# Patient Record
Sex: Female | Born: 1935 | Race: White | Hispanic: No | Smoking: Never smoker
Health system: Southern US, Community
[De-identification: ages and names within clinical notes are randomized; demographics above are authoritative.]

## PROBLEM LIST (undated history)

## (undated) DIAGNOSIS — K648 Other hemorrhoids: Secondary | ICD-10-CM

## (undated) DIAGNOSIS — N183 Chronic kidney disease, stage 3 unspecified: Secondary | ICD-10-CM

## (undated) DIAGNOSIS — F32A Depression, unspecified: Secondary | ICD-10-CM

## (undated) DIAGNOSIS — R627 Adult failure to thrive: Secondary | ICD-10-CM

## (undated) DIAGNOSIS — B029 Zoster without complications: Secondary | ICD-10-CM

## (undated) DIAGNOSIS — M1990 Unspecified osteoarthritis, unspecified site: Secondary | ICD-10-CM

## (undated) DIAGNOSIS — K219 Gastro-esophageal reflux disease without esophagitis: Secondary | ICD-10-CM

## (undated) DIAGNOSIS — R232 Flushing: Secondary | ICD-10-CM

## (undated) DIAGNOSIS — F329 Major depressive disorder, single episode, unspecified: Secondary | ICD-10-CM

## (undated) DIAGNOSIS — K5790 Diverticulosis of intestine, part unspecified, without perforation or abscess without bleeding: Secondary | ICD-10-CM

## (undated) DIAGNOSIS — N390 Urinary tract infection, site not specified: Secondary | ICD-10-CM

## (undated) DIAGNOSIS — D649 Anemia, unspecified: Secondary | ICD-10-CM

## (undated) DIAGNOSIS — G43909 Migraine, unspecified, not intractable, without status migrainosus: Secondary | ICD-10-CM

## (undated) DIAGNOSIS — K5120 Ulcerative (chronic) proctitis without complications: Secondary | ICD-10-CM

## (undated) DIAGNOSIS — K449 Diaphragmatic hernia without obstruction or gangrene: Secondary | ICD-10-CM

## (undated) DIAGNOSIS — R630 Anorexia: Secondary | ICD-10-CM

## (undated) HISTORY — DX: Other hemorrhoids: K64.8

## (undated) HISTORY — DX: Urinary tract infection, site not specified: N39.0

## (undated) HISTORY — PX: KNEE SURGERY: SHX244

## (undated) HISTORY — PX: MASTOIDECTOMY: SHX711

## (undated) HISTORY — DX: Zoster without complications: B02.9

## (undated) HISTORY — DX: Anemia, unspecified: D64.9

## (undated) HISTORY — DX: Unspecified osteoarthritis, unspecified site: M19.90

## (undated) HISTORY — PX: ABDOMINAL HYSTERECTOMY: SHX81

## (undated) HISTORY — DX: Ulcerative (chronic) proctitis without complications: K51.20

## (undated) HISTORY — PX: NOSE SURGERY: SHX723

## (undated) HISTORY — DX: Diaphragmatic hernia without obstruction or gangrene: K44.9

## (undated) HISTORY — PX: TONSILLECTOMY: SUR1361

## (undated) HISTORY — DX: Depression, unspecified: F32.A

## (undated) HISTORY — DX: Major depressive disorder, single episode, unspecified: F32.9

## (undated) HISTORY — DX: Chronic kidney disease, stage 3 (moderate): N18.3

## (undated) HISTORY — DX: Flushing: R23.2

## (undated) HISTORY — DX: Gastro-esophageal reflux disease without esophagitis: K21.9

## (undated) HISTORY — DX: Migraine, unspecified, not intractable, without status migrainosus: G43.909

## (undated) HISTORY — DX: Chronic kidney disease, stage 3 unspecified: N18.30

## (undated) HISTORY — DX: Diverticulosis of intestine, part unspecified, without perforation or abscess without bleeding: K57.90

---

## 2003-05-30 DIAGNOSIS — K5120 Ulcerative (chronic) proctitis without complications: Secondary | ICD-10-CM

## 2003-05-30 HISTORY — DX: Ulcerative (chronic) proctitis without complications: K51.20

## 2006-11-19 ENCOUNTER — Inpatient Hospital Stay (HOSPITAL_COMMUNITY): Admission: RE | Admit: 2006-11-19 | Discharge: 2006-11-21 | Payer: Self-pay | Admitting: Obstetrics and Gynecology

## 2006-11-19 ENCOUNTER — Encounter (HOSPITAL_COMMUNITY): Payer: Self-pay | Admitting: Obstetrics and Gynecology

## 2007-06-25 ENCOUNTER — Ambulatory Visit: Payer: Self-pay | Admitting: Gastroenterology

## 2008-04-14 ENCOUNTER — Encounter: Payer: Self-pay | Admitting: Gastroenterology

## 2008-05-29 DIAGNOSIS — K5790 Diverticulosis of intestine, part unspecified, without perforation or abscess without bleeding: Secondary | ICD-10-CM

## 2008-05-29 DIAGNOSIS — K648 Other hemorrhoids: Secondary | ICD-10-CM

## 2008-05-29 DIAGNOSIS — K449 Diaphragmatic hernia without obstruction or gangrene: Secondary | ICD-10-CM

## 2008-05-29 HISTORY — DX: Other hemorrhoids: K64.8

## 2008-05-29 HISTORY — DX: Diaphragmatic hernia without obstruction or gangrene: K44.9

## 2008-05-29 HISTORY — DX: Diverticulosis of intestine, part unspecified, without perforation or abscess without bleeding: K57.90

## 2008-06-01 ENCOUNTER — Encounter: Admission: RE | Admit: 2008-06-01 | Discharge: 2008-06-01 | Payer: Self-pay | Admitting: Family Medicine

## 2008-08-12 ENCOUNTER — Encounter: Payer: Self-pay | Admitting: Gastroenterology

## 2008-10-06 ENCOUNTER — Ambulatory Visit: Payer: Self-pay | Admitting: Gastroenterology

## 2008-10-06 DIAGNOSIS — K219 Gastro-esophageal reflux disease without esophagitis: Secondary | ICD-10-CM

## 2008-10-06 DIAGNOSIS — K5190 Ulcerative colitis, unspecified, without complications: Secondary | ICD-10-CM | POA: Insufficient documentation

## 2008-10-07 LAB — CONVERTED CEMR LAB
Basophils Relative: 0.2 % (ref 0.0–3.0)
Eosinophils Absolute: 0.1 10*3/uL (ref 0.0–0.7)
Eosinophils Relative: 2.1 % (ref 0.0–5.0)
Ferritin: 104.1 ng/mL (ref 10.0–291.0)
HCT: 39 % (ref 36.0–46.0)
Iron: 43 ug/dL (ref 42–145)
Lymphocytes Relative: 24.9 % (ref 12.0–46.0)
Lymphs Abs: 1.5 10*3/uL (ref 0.7–4.0)
MCHC: 34 g/dL (ref 30.0–36.0)
MCV: 90.9 fL (ref 78.0–100.0)
Monocytes Absolute: 0.6 10*3/uL (ref 0.1–1.0)
Platelets: 245 10*3/uL (ref 150.0–400.0)
Transferrin: 211.6 mg/dL — ABNORMAL LOW (ref 212.0–360.0)
Vitamin B-12: 1152 pg/mL — ABNORMAL HIGH (ref 211–911)

## 2008-11-05 ENCOUNTER — Ambulatory Visit: Payer: Self-pay | Admitting: Gastroenterology

## 2008-11-13 ENCOUNTER — Telehealth: Payer: Self-pay | Admitting: Gastroenterology

## 2008-11-16 ENCOUNTER — Ambulatory Visit: Payer: Self-pay | Admitting: Gastroenterology

## 2008-11-16 ENCOUNTER — Encounter: Payer: Self-pay | Admitting: Gastroenterology

## 2008-11-17 ENCOUNTER — Encounter: Payer: Self-pay | Admitting: Gastroenterology

## 2008-11-19 ENCOUNTER — Telehealth: Payer: Self-pay | Admitting: Gastroenterology

## 2010-01-18 ENCOUNTER — Ambulatory Visit: Payer: Self-pay | Admitting: Gastroenterology

## 2010-06-19 ENCOUNTER — Encounter: Payer: Self-pay | Admitting: Family Medicine

## 2010-06-28 NOTE — Assessment & Plan Note (Signed)
Summary: something stuck in her throat...em    History of Present Illness Visit Type: Follow-up Visit Primary GI MD: Sheryn Bison MD FACP FAGA Primary Provider: Carolynn Sayers, NP  Requesting Provider: na Chief Complaint: Pt feels like something is stuck in her throat History of Present Illness:   Chronic globus sensation in a 77 year old Caucasian female with a known large hiatal hernia. She is doing better her Prilosec 20 mg day but continues to have some throat clearing and hoarseness. She denies true dysphagia. She underwent endoscopy and dilation the urine after gadolinium. She otherwise denies gastrointestinal complaints at this time.   GI Review of Systems      Denies abdominal pain, acid reflux, belching, bloating, chest pain, dysphagia with liquids, dysphagia with solids, heartburn, loss of appetite, nausea, vomiting, vomiting blood, weight loss, and  weight gain.        Denies anal fissure, black tarry stools, change in bowel habit, constipation, diarrhea, diverticulosis, fecal incontinence, heme positive stool, hemorrhoids, irritable bowel syndrome, jaundice, light color stool, liver problems, rectal bleeding, and  rectal pain.    Current Medications (verified): 1)  Ipratropium Bromide 0.03 % Soln (Ipratropium Bromide) .... Two Spray Each Nostril As Needed 2)  Prilosec Otc 20 Mg Tbec (Omeprazole Magnesium) .... One Tablet By Mouth Once Daily 3)  Isometheptene-Apap-Dichloral 65-325-100 Mg Caps (Apap-Isometheptene-Dichloral) .... Two As Needed For Migrains 4)  Diazepam 2 Mg Tabs (Diazepam) .... As Needed For Sleep and Stress 5)  Motrin Ib 200 Mg Tabs (Ibuprofen) .... One Tablet By Mouth Once Daily 6)  Fish Oil 1200 Mg Caps (Omega-3 Fatty Acids) .Marland Kitchen.. 1 Cap By Mouth Once Daily 7)  Calcium Carbonate-Vitamin D 600-400 Mg-Unit  Tabs (Calcium Carbonate-Vitamin D) .... One Tablet By Mouth Once Daily 8)  Vitamin D3 2000 Unit Caps (Cholecalciferol) .... One Cap By Mouth Once  Daily 9)  Multivitamins   Tabs (Multiple Vitamin) .... One Tab By Mouth Once Daily 10)  Aspirin 81 Mg  Tabs (Aspirin) .... One Tablet By Mouth Once Daily 11)  Cvs Stool Softener 240 Mg Caps (Docusate Calcium) .... One Tablet By Mouth Once Daily 12)  Prozac 10 Mg Caps (Fluoxetine Hcl) .... One Tablet By Mouth Once Daily 13)  Ketoconazole 2 % Sham (Ketoconazole) .... As Directed 14)  Benadryl 25 Mg Caps (Diphenhydramine Hcl) .... One Capsule By Mouth Once Daily At Bedtime 15)  Ensure  Liqd (Nutritional Supplements) .... Once or Twice A Week  Allergies (verified): No Known Drug Allergies  Past History:  Past medical, surgical, family and social histories (including risk factors) reviewed for relevance to current acute and chronic problems.  Past Medical History: Current Problems:  DEPRESSION (ICD-311) FATIGUE, ACUTE (ICD-780.79) CONSTIPATION (ICD-564.00) COLITIS, ULCERATIVE (ICD-556.9) GERD (ICD-530.81) ARTHRITIS (ICD-716.90) UNSPECIFIED ANEMIA (ICD-285.9)  Past Surgical History: Reviewed history from 10/06/2008 and no changes required. Hysterectomy Knee Surgery Mastoid  Family History: Reviewed history from 10/06/2008 and no changes required. Family History of Breast Cancer:Mother  No FH of Colon Cancer: Family History of Diabetes: Mother   Social History: Reviewed history from 10/06/2008 and no changes required. Widowed Patient has never smoked.  Alcohol Use - no Illicit Drug Use - no Daily Caffeine Use: 1 cup a day   Review of Systems       The patient complains of anemia, arthritis/joint pain, cough, depression-new, fatigue, and voice change.  The patient denies allergy/sinus, anxiety-new, back pain, blood in urine, breast changes/lumps, change in vision, confusion, coughing up blood, fainting, fever, headaches-new, hearing problems, heart  murmur, heart rhythm changes, itching, menstrual pain, muscle pains/cramps, night sweats, nosebleeds, pregnancy symptoms,  shortness of breath, skin rash, sleeping problems, sore throat, swelling of feet/legs, swollen lymph glands, thirst - excessive , urination - excessive , urination changes/pain, urine leakage, and vision changes.    Vital Signs:  Patient profile:   77 year old female Height:      66 inches Weight:      124 pounds BMI:     20.09 BSA:     1.63 Pulse rate:   78 / minute Pulse rhythm:   regular BP sitting:   124 / 62  (left arm) Cuff size:   regular  Vitals Entered By: Ok Anis CMA (January 18, 2010 2:25 PM)   Physical Exam  General:  Well developed, well nourished, no acute distress.healthy appearing.   Head:  Normocephalic and atraumatic. Eyes:  PERRLA, no icterus. Mouth:  No deformity or lesions, dentition normal. Lungs:  Clear throughout to auscultation. Heart:  Regular rate and rhythm; no murmurs, rubs,  or bruits. Abdomen:  Soft, nontender and nondistended. No masses, hepatosplenomegaly or hernias noted. Normal bowel sounds. Extremities:  No clubbing, cyanosis, edema or deformities noted. Neurologic:  Alert and  oriented x4;  grossly normal neurologically. Cervical Nodes:  No significant cervical adenopathy. Psych:  Alert and cooperative. Normal mood and affect.   Impression & Recommendations:  Problem # 1:  GERD (ICD-530.81) Assessment Improved Try Dexilant 60 mg a day in place of Prilosec. She is to call in one month's time for progress report. She obviously needs chronic PPI therapy and a chronic anti-reflex regime. I see no need for repeat endoscopy at this time.  Problem # 2:  CONSTIPATION (ICD-564.00) Assessment: Improved high-fiber diet as tolerated.  Patient Instructions: 1)  Stop Omeprazole. 2)  Begin Dexilant once a day.   3)  Report back in one month. 4)  The medication list was reviewed and reconciled.  All changed / newly prescribed medications were explained.  A complete medication list was provided to the patient / caregiver. 5)  Copy sent to :Dr.  Sherre Lain  6)  Please continue current medications.  7)  Avoid foods high in acid content ( tomatoes, citrus juices, spicy foods) . Avoid eating within 3 to 4 hours of lying down or before exercising. Do not over eat; try smaller more frequent meals. Elevate head of bed four inches when sleeping.

## 2010-10-11 NOTE — Op Note (Signed)
Stacy Mcguire, Stacy Mcguire NO.:  [id number]   MEDICAL RECORD NO.:  [id number]          PATIENT TYPE:  INP   LOCATION:  9302                          [facility]ACILITY:  [facility]   PHYSICIAN:  Zelphia Cairo, MD    DATE OF BIRTH:  Apr 02, 1928   DATE OF PROCEDURE:  11/19/2006  DATE OF DISCHARGE:                               OPERATIVE REPORT   PREOPERATIVE DIAGNOSIS:  1. Fibroid uterus.  2. Pelvic mass.   POSTOPERATIVE DIAGNOSIS:  1. Fibroid uterus.  2. Pelvic mass.  3. Pathology pending.   PROCEDURE:  TAH-BSO   SURGEON:  Dr. Renaldo Fiddler   ASSISTANT:  Dr. Rana Snare   ANESTHESIA:  General.   SPECIMEN:  Uterine cervix.  Bilateral tubes and ovaries.   ESTIMATED BLOOD LOSS:  700 mL.   COMPLICATIONS:  None.   URINE OUTPUT:  125 mL of clear urine.   FLUIDS:  2 liters.   CONDITION:  Stable and extubated to recovery room.   PROCEDURE:  The patient was taken to the operating room where general  anesthesia was easily obtained. She was placed in the supine position.  She was prepped and draped in sterile fashion and a Foley catheter was  inserted.  A Pfannenstiel skin incision was made with a scalpel and this  was carried down to the underlying fascia.  The fascia was incised in  the midline and this was extended laterally using Mayo scissors.  Kocher  clamps were used to grasp the superior portion of the fascia.  This was  tented upwards and the underlying rectus muscles were dissected off  using the Bovie.  The inferior portion of the fascia was then tented  upwards and dissected off the underlying rectus muscles using the Bovie.  The peritoneum was then identified and entered bluntly.  The self-  retaining O'Connor-O'Sullivan retractor was then inserted and the bowel  was packed out of the pelvis. Upon inspection the enlarged fibroid  uterus was noted and brought to the incision. Bilateral fallopian tubes  and ovaries were normal in appearance.   The large exophytic anterior  fibroid was grasped with a thyroid  tenaculum for manipulation of the uterus, bilateral cornua were also  grasped with Kelly clamps for uterine manipulation.  Bilateral round  ligaments were cauterized and transected.  The right round ligament was  suture ligated with #0 Vicryl.  The anterior leaf of the broad ligament  was then incised along the bladder reflection to the midline from both  sides.  The bladder was gently dissected off the lower uterine segment  using a dry sponge.  Infundibulopelvic ligaments on both sides were then  skeletonized, clamped and transected.  They were doubly suture ligated  with #0 Vicryl.  Excellent hemostasis was noted. Uterine artery on the  right side was skeletonized, clamped and transected.  This pedicle was  then suture ligated with #0 Vicryl. The right uterine artery was then  clamped, transected and suture ligated.  At this point there were noted  to be some bleeding from the left peritoneal edges adjacent to the left  uterine artery pedicle.  This was clamped with a right angle clamp and  suture ligated.  Next the uterosacral ligaments were clamped  bilaterally, transected and suture ligated.  The cervix and uterus were  then amputated using Mayo scissors.  The vaginal cuff angles were closed  using figure-of-eight stitches of #0 Vicryl.  The remainder of the  vaginal cuff was closed using a series of interrupted #0 Vicryl figure-  of-eight stitches.  Hemostasis of the vaginal cuff was assured at this  time.  The pelvis was then copiously irrigated with warm normal saline.  Ureters were identified bilaterally.  Hemostasis was assured.  All bowel  packs were removed.  The appendix was visualized and appeared normal.  The retractor was removed. The peritoneum was then reapproximated with  Vicryl.  The fascia was closed using Monocryl and the skin was closed  with staples.  The patient tolerated the procedure well.  Sponge, lap,  needle and instrument  counts were correct x2.  She was given antibiotics  prior to skin incision.  The patient was then taken to the PACU in  stable condition.  IVP was ordered in the PACU to assess the left  ureter.      Zelphia Cairo, MD  Electronically Signed     GA/MEDQ  D:  11/19/2006  T:  11/19/2006  Job:  [id number]

## 2010-10-11 NOTE — Assessment & Plan Note (Signed)
[facility]                         GASTROENTEROLOGY OFFICE NOTE   SELESTE, TALLMAN                       MRN:          [id number]  DATE:06/25/2007                            DOB:          [birth year]    HISTORY:  Ms. Brickel is a 77 year old white female who has had chronic  inflammatory bowel disease, manifested mostly by proctitis, with several  colonoscopies in [place], [place] many years ago.  I last did a  surveillance colonoscopy five years ago, and she had some mild  proctitis.  Her exam otherwise was normal, with multiple mucosal  biopsies.   The patient  has been maintained on Dipentum 250 mg three times daily  for several years, but currently would like to switch medications,  because of cost constraints.  She takes ibuprofen, vitamin C,  multivitamins and over-the-counter Prilosec daily.  She is currently in  remission and denies abdominal pain, bowel irregularity, melena or  hematochezia.   FAMILY HISTORY:  She has no family history of colon cancer or colonic  polyposis, but does have a sister with questionable Crohn's disease.   PAST MEDICAL/SURGICAL HISTORY:  The patient recently has had a  hysterectomy because of fibroid tumors of her uterus.  She is followed  by Dr. Samara Snide at [facility] and has  routine blood work and urinalysis done.   PHYSICAL EXAMINATION:  GENERAL:  She is a healthy-appearing white  female, in no distress appearing her stated age.  VITAL SIGNS:  She is 5 feet 6-1/2 inches tall, weight 129 pounds, blood  pressure 136/70, pulse 90.  The patient did not desire a physical examination.   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  I have recommended to her that she have  a dysplasia screening every three to five years, but she has declined.   RECOMMENDATIONS:  1. At her request, I will switch her to Azulfidine 500 mg twice  daily.      I have explained to her the side effects from sulfa medications,      including nausea, photo-sensitivity, headache and skin rashes.  2. The patient needs folic acid 1 mg daily supplementation on      Azulfidine.  3. The patient needs routine yearly CBC, liver profiles and      urinalysis, on chronic immuno-salicylate therapy.  She relates that      she receives this through her primary care physician.      We will ask for a copy of these results.  4. Continue other medications as per her primary care physicians.     Vania Rea. Jarold Motto, MD, Caleen Essex, FAGA  Electronically Signed    DRP/MedQ  DD: 06/25/2007  DT: 06/25/2007  Job #: [id number]   cc:   Samara Snide, MD  Zelphia Cairo, MD

## 2010-10-11 NOTE — H&P (Signed)
Stacy Mcguire, SERMONS NO.:  [id number]   MEDICAL RECORD NO.:  [id number]          PATIENT TYPE:  AMB   LOCATION:  SDC                           [facility]ILITY:  [facility]   PHYSICIAN:  Zelphia Cairo, MD    DATE OF BIRTH:  December 24, 1927   DATE OF ADMISSION:  11/19/2006  DATE OF DISCHARGE:                              HISTORY & PHYSICAL   Shandrea is a 77 year old white female who presented to the office  originally with complaints of a bulge in her pelvic area. She denies any  pain, bowel or bladder complaints. She denies any other concerns.  On  further evaluation, ultrasound revealed a fibroid uterus with fibroids  measuring 1.8 to 8.8 cm and a complex solid mass with calcifications in  the mid pelvis.   PAST MEDICAL HISTORY:  1. Migraines.  2. GERD.  3. Ulcerative colitis.   PAST SURGICAL HISTORY:  1. Left knee surgery.  2. Mastoid surgery.   SOCIAL HISTORY:  Negative for tobacco and drug [place].  She uses alcohol  occasionally.   ALLERGIES:  None.   MEDICATIONS:  1. Dipentum 3 times per day.  2. Prilosec one time per day.  3. Midrin as needed.  4. Diazepam p.r.n.  5. Ibuprofen p.r.n.  6. Multivitamins daily.   GYN HISTORY:  Significant for menopause at the age of 29.  She denies a  history of abnormal PAP smears.  Her last Pap smear in June 2008 was  normal.   FAMILY HISTORY:  Significant for mom diagnosed with breast cancer in her  60s.  Negative for colon and GYN cancer.   OB HISTORY:  Significant for four full-term vaginal deliveries.   PHYSICAL EXAMINATION:  VITAL SIGNS:  Height 5 feet 6 inches, weight 128.  Blood pressure 110/80.  HEAD AND NECK:  Normal.  No thyromegaly or nodularity.  HEART:  Regular rate and rhythm.  LUNGS:  Clear bilaterally.  ABDOMEN:  Soft, nontender.  No palpable masses are noted.  Good bowel  sounds in all four quadrants.  PELVIC:  Exam shows normal external female genitalia. Vagina is atrophic  without lesions.  Cervix  appears normal.  Uterus is enlarged, mobile,  and nontender, approximately 18-week size.  No adnexal tenderness is  noted.  RECTAL:  Exam confirms.  There is no nodularity.   Pelvic ultrasound:  Fibroid uterus with 4 fibroids measuring 1.8 cm, 5.2  cm, 7.0 cm, and 8.8 cm.  There is also a large complex mass with solid  components and calcifications in the mid pelvis measuring 9.6 x 5.8 x  7.8 cm.  A small amount of fluid in the cul-de-sac.   CA125 was done and normal.   ASSESSMENT AND PLAN:  A 77 year old white female with fibroid uterus and  pelvic mass.  CA125 is normal. We discussed surgical management. Risk,  benefits, and alternatives were discussed with the patient for total  abdominal hysterectomy and bilateral salpingo-oophorectomy.      Zelphia Cairo, MD  Electronically Signed     GA/MEDQ  D:  11/18/2006  T:  11/18/2006  Job:  [id number]

## 2011-03-15 LAB — CBC
HCT: 31.2 — ABNORMAL LOW
HCT: 39.3
Hemoglobin: 13
MCHC: 33
MCV: 87.8
MCV: 88.3
Platelets: 207
Platelets: 225
RBC: 3.55 — ABNORMAL LOW
RDW: 13.8
WBC: 10.1

## 2011-03-15 LAB — CREATININE, SERUM
Creatinine, Ser: 0.78
GFR calc non Af Amer: >60

## 2011-03-15 LAB — BUN: BUN: 17

## 2011-11-21 ENCOUNTER — Other Ambulatory Visit: Payer: Self-pay | Admitting: Family Medicine

## 2011-11-21 DIAGNOSIS — Z1231 Encounter for screening mammogram for malignant neoplasm of breast: Secondary | ICD-10-CM

## 2011-12-18 ENCOUNTER — Ambulatory Visit
Admission: RE | Admit: 2011-12-18 | Discharge: 2011-12-18 | Disposition: A | Discharge status: Home / Self Care | Payer: Medicare PPO | Source: Ambulatory Visit | Attending: Family Medicine | Admitting: Family Medicine

## 2011-12-18 DIAGNOSIS — Z1231 Encounter for screening mammogram for malignant neoplasm of breast: Secondary | ICD-10-CM

## 2012-01-18 ENCOUNTER — Ambulatory Visit (INDEPENDENT_AMBULATORY_CARE_PROVIDER_SITE_OTHER): Payer: Medicare PPO | Admitting: Gastroenterology

## 2012-01-18 ENCOUNTER — Encounter: Payer: Self-pay | Admitting: Gastroenterology

## 2012-01-18 VITALS — BP 128/76 | HR 68 | Ht 64.0 in | Wt 132.2 lb

## 2012-01-18 DIAGNOSIS — M818 Other osteoporosis without current pathological fracture: Secondary | ICD-10-CM

## 2012-01-18 DIAGNOSIS — Z8719 Personal history of other diseases of the digestive system: Secondary | ICD-10-CM

## 2012-01-18 NOTE — Progress Notes (Signed)
History of Present Illness: This is a 78 year old Caucasian female with chronic idiopathic osteoporosis treated with calcium and vitamin D. She has chronic GERD well-controlled with Prilosec 20 mg a day. She continues with some extra esophageal manifestations of acid reflux such as hoarseness, coughing, and throat clearing. She is refused ENT exam. Endoscopic exam 2 years ago showed a large hiatal hernia but no evidence of Barrett's mucosa. She is up-to-date on her colonoscopy exam. The patient denies other gastrointestinal complaints.    Current Medications, Allergies, Past Medical History, Past Surgical History, Family History and Social History were reviewed in [employer] record.   Assessment and plan:we had a long discussion today about GERD, PPI therapy, osteoporosis, esophageal cancer, throat cancer, et Karie Soda. I have urged her to continue Prilosec 20 mg a day to control her GERD, and I do not think she needs followup endoscopy at this time. I have suggested ENT evaluation which she will consider.I doubt her osteoporosis has adversely affected by Prilosec 20 mg a day. This patient is very detail oriented,, and has multiple questions, does frequent medical searches on the Internet, and has multiple somatic complaints that she tries to dismiss secondary to medications. After reviewing all of her data, I do not think these symptoms are more common than one would see on placebo vs. PPI use. No diagnosis found.

## 2012-01-18 NOTE — Patient Instructions (Addendum)
Continue your current medications.

## 2013-02-20 ENCOUNTER — Other Ambulatory Visit: Payer: Self-pay

## 2013-02-20 DIAGNOSIS — Z1231 Encounter for screening mammogram for malignant neoplasm of breast: Secondary | ICD-10-CM

## 2013-03-19 ENCOUNTER — Ambulatory Visit
Admission: RE | Admit: 2013-03-19 | Discharge: 2013-03-19 | Disposition: A | Discharge status: Home / Self Care | Payer: Medicare PPO | Source: Ambulatory Visit

## 2013-03-19 DIAGNOSIS — Z1231 Encounter for screening mammogram for malignant neoplasm of breast: Secondary | ICD-10-CM

## 2014-09-15 ENCOUNTER — Other Ambulatory Visit: Payer: Self-pay | Admitting: Family Medicine

## 2014-09-15 DIAGNOSIS — R1310 Dysphagia, unspecified: Secondary | ICD-10-CM

## 2014-09-18 ENCOUNTER — Ambulatory Visit
Admission: RE | Admit: 2014-09-18 | Discharge: 2014-09-18 | Disposition: A | Discharge status: Home / Self Care | Payer: Medicare PPO | Source: Ambulatory Visit | Attending: Family Medicine | Admitting: Family Medicine

## 2014-09-18 DIAGNOSIS — R1310 Dysphagia, unspecified: Secondary | ICD-10-CM

## 2014-10-28 ENCOUNTER — Emergency Department (HOSPITAL_COMMUNITY)
Admission: EM | Admit: 2014-10-28 | Discharge: 2014-10-28 | Disposition: A | Discharge status: Home / Self Care | Payer: Medicare PPO | Attending: Emergency Medicine | Admitting: Emergency Medicine

## 2014-10-28 ENCOUNTER — Encounter (HOSPITAL_COMMUNITY): Payer: Self-pay | Admitting: Emergency Medicine

## 2014-10-28 DIAGNOSIS — R531 Weakness: Secondary | ICD-10-CM

## 2014-10-28 DIAGNOSIS — Z7952 Long term (current) use of systemic steroids: Secondary | ICD-10-CM | POA: Diagnosis not present

## 2014-10-28 DIAGNOSIS — F329 Major depressive disorder, single episode, unspecified: Secondary | ICD-10-CM | POA: Diagnosis not present

## 2014-10-28 DIAGNOSIS — K219 Gastro-esophageal reflux disease without esophagitis: Secondary | ICD-10-CM | POA: Insufficient documentation

## 2014-10-28 DIAGNOSIS — Z79899 Other long term (current) drug therapy: Secondary | ICD-10-CM | POA: Insufficient documentation

## 2014-10-28 DIAGNOSIS — N390 Urinary tract infection, site not specified: Secondary | ICD-10-CM

## 2014-10-28 DIAGNOSIS — Z862 Personal history of diseases of the blood and blood-forming organs and certain disorders involving the immune mechanism: Secondary | ICD-10-CM | POA: Diagnosis not present

## 2014-10-28 LAB — COMPREHENSIVE METABOLIC PANEL
ALT: 17 U/L (ref 14–54)
AST: 16 U/L (ref 15–41)
Albumin: 3.3 g/dL — ABNORMAL LOW (ref 3.5–5.0)
Alkaline Phosphatase: 66 U/L (ref 38–126)
Anion gap: 8 (ref 5–15)
BUN: 26 mg/dL — AB (ref 6–20)
CALCIUM: 9 mg/dL (ref 8.9–10.3)
CHLORIDE: 106 mmol/L (ref 101–111)
CO2: 22 mmol/L (ref 22–32)
Creatinine, Ser: 1 mg/dL (ref 0.44–1.00)
GFR calc Af Amer: 58 mL/min — ABNORMAL LOW (ref >60)
GFR, EST NON AFRICAN AMERICAN: 50 mL/min — AB (ref >60)
Glucose, Bld: 133 mg/dL — ABNORMAL HIGH (ref 65–99)
Potassium: 3.4 mmol/L — ABNORMAL LOW (ref 3.5–5.1)
Sodium: 136 mmol/L (ref 135–145)
Total Bilirubin: 0.3 mg/dL (ref 0.3–1.2)
Total Protein: 6.9 g/dL (ref 6.5–8.1)

## 2014-10-28 LAB — CBC WITH DIFFERENTIAL/PLATELET
BASOS PCT: 0 % (ref 0–1)
Basophils Absolute: 0 10*3/uL (ref 0.0–0.1)
Eosinophils Absolute: 0 10*3/uL (ref 0.0–0.7)
Eosinophils Relative: 0 % (ref 0–5)
HEMATOCRIT: 35.2 % — AB (ref 36.0–46.0)
Hemoglobin: 11.4 g/dL — ABNORMAL LOW (ref 12.0–15.0)
Lymphocytes Relative: 15 % (ref 12–46)
Lymphs Abs: 1.4 10*3/uL (ref 0.7–4.0)
MCH: 28.5 pg (ref 26.0–34.0)
MCHC: 32.4 g/dL (ref 30.0–36.0)
MCV: 88 fL (ref 78.0–100.0)
Monocytes Absolute: 1.2 10*3/uL — ABNORMAL HIGH (ref 0.1–1.0)
Monocytes Relative: 13 % — ABNORMAL HIGH (ref 3–12)
NEUTROS ABS: 6.8 10*3/uL (ref 1.7–7.7)
Neutrophils Relative %: 72 % (ref 43–77)
Platelets: 264 10*3/uL (ref 150–400)
RBC: 4 MIL/uL (ref 3.87–5.11)
RDW: 13.4 % (ref 11.5–15.5)
WBC: 9.4 10*3/uL (ref 4.0–10.5)

## 2014-10-28 LAB — TSH: TSH: 1.58 u[IU]/mL (ref 0.350–4.500)

## 2014-10-28 LAB — URINALYSIS, ROUTINE W REFLEX MICROSCOPIC
Bilirubin Urine: NEGATIVE
Glucose, UA: NEGATIVE mg/dL
Ketones, ur: NEGATIVE mg/dL
Nitrite: POSITIVE — AB
Protein, ur: 30 mg/dL — AB
Specific Gravity, Urine: 1.019 (ref 1.005–1.030)
UROBILINOGEN UA: 0.2 mg/dL (ref 0.0–1.0)
pH: 6.5 (ref 5.0–8.0)

## 2014-10-28 LAB — URINE MICROSCOPIC-ADD ON

## 2014-10-28 LAB — I-STAT TROPONIN, ED: TROPONIN I, POC: 0 ng/mL (ref 0.00–0.08)

## 2014-10-28 MED ORDER — CEFTRIAXONE SODIUM 1 G IJ SOLR
1.0000 g | Freq: Once | INTRAMUSCULAR | Status: AC
  Administered 2014-10-28: 1 g via INTRAVENOUS
  Filled 2014-10-28: qty 10

## 2014-10-28 MED ORDER — CEPHALEXIN 500 MG PO CAPS: 500.0000 mg | ORAL_CAPSULE | Freq: Three times a day (TID) | ORAL | Status: DC

## 2014-10-28 MED ORDER — SODIUM CHLORIDE 0.9 % IV BOLUS (SEPSIS)
1000.0000 mL | Freq: Once | INTRAVENOUS | Status: AC
  Administered 2014-10-28: 1000 mL via INTRAVENOUS

## 2014-10-28 NOTE — ED Notes (Signed)
Patient states she has been feeling weak x 6 years, but today was worse. Patient states she has been sleeping more than usual, decreased appetite, and "feels bad." patient denies any pain or discomforts.

## 2014-10-28 NOTE — ED Notes (Signed)
Patient states she woke up this morning feeling hot, and was having similar experiences over the weekend with increased sleeping x3 days. Patient unsure if she had a fever. Decreased PO intake. Patient denies sick contact. Patient states she feels "weak" and has felt this way for 6 years.

## 2014-10-28 NOTE — ED Provider Notes (Signed)
CSN: [id number]     Arrival date & time 10/28/14  1023 History   First MD Initiated Contact with Patient 10/28/14 1057     Chief Complaint  Patient presents with  . feels bad      (Consider location/radiation/quality/duration/timing/severity/associated sxs/prior Treatment) HPI Stacy Mcguire is a 81 y.o. female with history of anemia, depression, GERD, presents to emergency department with complaint of weakness. Patient states she has been weak for multiple years. She has been to her primary doctor several times but no diagnosis. She states this morning when she woke up she felt worse than usual. She states she had generalized weakness and just "did not feel good." She denies any pain.  No headache. No recent losses. No urinary symptoms. No nausea, vomiting, diarrhea. Patient states she told her family who decided to bring her here. Patient states that she feels better already. Currently asymptomatic.=  Past Medical History  Diagnosis Date  . Arthritis   . Anemia, unspecified   . Depression   . GERD (gastroesophageal reflux disease)   . Diverticulosis 2010    Colonoscopy  . Internal hemorrhoids 2010    Colonoscopy  . Hiatal hernia 2010    EGD   . Ulcerative (chronic) proctitis 2005    Colonoscopy    Past Surgical History  Procedure Laterality Date  . Abdominal hysterectomy    . Knee surgery    . Mastoidectomy     Family History  Problem Relation Age of Onset  . Breast cancer Mother   . Colon cancer Neg Hx    History  Substance Use Topics  . Smoking status: Never Smoker   . Smokeless tobacco: Never Used  . Alcohol Use: No   OB History    No data available     Review of Systems  Constitutional: Positive for fatigue. Negative for fever, chills, activity change and appetite change.  Respiratory: Negative for cough, chest tightness and shortness of breath.   Cardiovascular: Negative for chest pain, palpitations and leg swelling.  Gastrointestinal: Negative for nausea,  vomiting, abdominal pain and diarrhea.  Genitourinary: Negative for dysuria, flank pain, vaginal bleeding, vaginal discharge, vaginal pain and pelvic pain.  Musculoskeletal: Negative for myalgias, arthralgias, neck pain and neck stiffness.  Skin: Negative for rash.  Neurological: Positive for weakness. Negative for dizziness and headaches.  All other systems reviewed and are negative.     Allergies  Neosporin af  Home Medications   Prior to Admission medications   Medication Sig Start Date End Date Taking? Authorizing Provider  acetaminophen (TYLENOL) 500 MG tablet Take 500 mg by mouth every 6 (six) hours as needed for moderate pain or headache.   Yes Historical Provider, MD  diazepam (VALIUM) 2 MG tablet Take 2 mg by mouth at bedtime as needed for sedation.    Yes Historical Provider, MD  ENSURE (ENSURE) Take 1 Can by mouth daily. Once or twice a week   Yes Historical Provider, MD  hydrocortisone cream 0.5 % Apply 1 application topically 2 (two) times daily as needed for itching.   Yes Historical Provider, MD  omeprazole (PRILOSEC) 20 MG capsule Take 20 mg by mouth daily.   Yes Historical Provider, MD   BP 118/59 mmHg  Pulse 93  Temp(Src) 97.7 F (36.5 C) (Oral)  Resp 16  SpO2 97% Physical Exam  Constitutional: She is oriented to person, place, and time. She appears well-developed and well-nourished. No distress.  HENT:  Head: Normocephalic.  Eyes: Conjunctivae and EOM are normal.  Pupils are equal, round, and reactive to light.  Neck: Normal range of motion. Neck supple.  Cardiovascular: Normal rate, regular rhythm, normal heart sounds and intact distal pulses.   Pulmonary/Chest: Effort normal and breath sounds normal. No respiratory distress. She has no wheezes. She has no rales.  Abdominal: Soft. Bowel sounds are normal. She exhibits no distension. There is no tenderness. There is no rebound.  Musculoskeletal: She exhibits no edema.  Neurological: She is alert and oriented  to person, place, and time. No cranial nerve deficit. Coordination normal.  5/5 and equal upper and lower extremity strength bilaterally. Equal grip strength bilaterally. Normal finger to nose and heel to shin. No pronator drift.   Skin: Skin is warm and dry.  Psychiatric: She has a normal mood and affect. Her behavior is normal.  Nursing note and vitals reviewed.   ED Course  Procedures (including critical care time) Labs Review Labs Reviewed  CBC WITH DIFFERENTIAL/PLATELET - Abnormal; Notable for the following:    Hemoglobin 11.4 (*)    HCT 35.2 (*)    Monocytes Relative 13 (*)    Monocytes Absolute 1.2 (*)    All other components within normal limits  COMPREHENSIVE METABOLIC PANEL - Abnormal; Notable for the following:    Potassium 3.4 (*)    Glucose, Bld 133 (*)    BUN 26 (*)    Albumin 3.3 (*)    GFR calc non Af Amer 50 (*)    GFR calc Af Amer 58 (*)    All other components within normal limits  URINALYSIS, ROUTINE W REFLEX MICROSCOPIC (NOT AT [facility]) - Abnormal; Notable for the following:    APPearance CLOUDY (*)    Hgb urine dipstick MODERATE (*)    Protein, ur 30 (*)    Nitrite POSITIVE (*)    Leukocytes, UA LARGE (*)    All other components within normal limits  URINE MICROSCOPIC-ADD ON - Abnormal; Notable for the following:    Bacteria, UA MANY (*)    Casts HYALINE CASTS (*)    All other components within normal limits  URINE CULTURE  TSH  I-STAT TROPOININ, ED    Imaging Review No results found.   EKG Interpretation None      MDM   Final diagnoses:  UTI (lower urinary tract infection)  Weakness    Patient with generalized weakness for multiple years, worse this morning. No specific complaints. Her exam is unremarkable. We'll get labs, urinalysis, will monitor.  2:29 PM  UA infected, culture sent. Will give 1 g of Keflex IV, bolus of fluids ordered as well. Pt is not vomiting. No pain. VS normal. Afebrile.  Discussed results with patient, plan to  discharge home with antibiotics, follow-up with pcp.   Filed Vitals:   10/28/14 1039 10/28/14 1231 10/28/14 1235 10/28/14 1515  BP: 118/59  142/70 162/77  Pulse: 93 79 78 77  Temp: 97.7 F (36.5 C)     TempSrc: Oral     Resp: 16 13 16 16   SpO2: 97% 97% 96% 96%       Jeannett Senior, PA-C 10/28/14 1637  Virgel Manifold, MD 10/29/14 [phone]

## 2014-10-28 NOTE — Discharge Instructions (Signed)
Your urine analysis today shows infection. Take keflex as prescribed until all gone. Follow up with your doctor or doctor of your choice for recheck if not improving. Return if worsening symptoms, fever, vomiting, pain.   Urinary Tract Infection Urinary tract infections (UTIs) can develop anywhere along your urinary tract. Your urinary tract is your body's drainage system for removing wastes and extra water. Your urinary tract includes two kidneys, two ureters, a bladder, and a urethra. Your kidneys are a pair of bean-shaped organs. Each kidney is about the size of your fist. They are located below your ribs, one on each side of your spine. CAUSES Infections are caused by microbes, which are microscopic organisms, including fungi, viruses, and bacteria. These organisms are so small that they can only be seen through a microscope. Bacteria are the microbes that most commonly cause UTIs. SYMPTOMS  Symptoms of UTIs may vary by age and gender of the patient and by the location of the infection. Symptoms in young women typically include a frequent and intense urge to urinate and a painful, burning feeling in the bladder or urethra during urination. Older women and men are more likely to be tired, shaky, and weak and have muscle aches and abdominal pain. A fever may mean the infection is in your kidneys. Other symptoms of a kidney infection include pain in your back or sides below the ribs, nausea, and vomiting. DIAGNOSIS To diagnose a UTI, your caregiver will ask you about your symptoms. Your caregiver also will ask to provide a urine sample. The urine sample will be tested for bacteria and white blood cells. White blood cells are made by your body to help fight infection. TREATMENT  Typically, UTIs can be treated with medication. Because most UTIs are caused by a bacterial infection, they usually can be treated with the use of antibiotics. The choice of antibiotic and length of treatment depend on your  symptoms and the type of bacteria causing your infection. HOME CARE INSTRUCTIONS  If you were prescribed antibiotics, take them exactly as your caregiver instructs you. Finish the medication even if you feel better after you have only taken some of the medication.  Drink enough water and fluids to keep your urine clear or pale yellow.  Avoid caffeine, tea, and carbonated beverages. They tend to irritate your bladder.  Empty your bladder often. Avoid holding urine for long periods of time.  Empty your bladder before and after sexual intercourse.  After a bowel movement, women should cleanse from front to back. Use each tissue only once. SEEK MEDICAL CARE IF:   You have back pain.  You develop a fever.  Your symptoms do not begin to resolve within 3 days. SEEK IMMEDIATE MEDICAL CARE IF:   You have severe back pain or lower abdominal pain.  You develop chills.  You have nausea or vomiting.  You have continued burning or discomfort with urination. MAKE SURE YOU:   Understand these instructions.  Will watch your condition.  Will get help right away if you are not doing well or get worse. Document Released: 02/22/2005 Document Revised: 11/14/2011 Document Reviewed: 06/23/2011 [facility] Patient Information 2015 [place], [place]. This information is not intended to replace advice given to you by your health care provider. Make sure you discuss any questions you have with your health care provider.

## 2014-10-28 NOTE — ED Notes (Signed)
ED discharge tool not working; pt understood discharge instruction and not in any pain upon discharge

## 2014-10-30 LAB — URINE CULTURE: Colony Count: >=100000

## 2014-10-31 NOTE — Progress Notes (Signed)
ED Antimicrobial Stewardship Positive Culture Follow Up   Stacy Mcguire is an 81 y.o. female who presented to [facility] on 10/28/2014 with a chief complaint of  Chief Complaint  Patient presents with  . feels bad     Recent Results (from the past 720 hour(s))  Urine culture     Status: None   Collection Time: 10/28/14  4:34 PM  Result Value Ref Range Status   Specimen Description URINE, RANDOM  Final   Special Requests NONE  Final   Colony Count   Final    >=100,000 COLONIES/ML Performed at [facility]    Culture   Final    ESCHERICHIA COLI Performed at [facility]    Report Status 10/30/2014 FINAL  Final   Organism ID, Bacteria ESCHERICHIA COLI  Final      Susceptibility   Escherichia coli - MIC*    AMPICILLIN >=32 RESISTANT Resistant     CEFAZOLIN >=64 RESISTANT Resistant     CEFTRIAXONE <=1 SENSITIVE Sensitive     CIPROFLOXACIN <=0.25 SENSITIVE Sensitive     GENTAMICIN <=1 SENSITIVE Sensitive     LEVOFLOXACIN <=0.12 SENSITIVE Sensitive     NITROFURANTOIN 32 SENSITIVE Sensitive     TOBRAMYCIN <=1 SENSITIVE Sensitive     TRIMETH/SULFA >=320 RESISTANT Resistant     PIP/TAZO >=128 RESISTANT Resistant     * ESCHERICHIA COLI    [x]  Treated with Cephalexin, organism resistant to prescribed antimicrobial  New antibiotic prescription: Cipro 500mg  PO BID x 7 days.  Patient should stop Keflex.  ED Provider: Alecia Lemming, PA-C   Norva Riffle 10/31/2014, 9:54 AM Infectious Diseases Pharmacist Phone# [phone]

## 2014-11-01 ENCOUNTER — Telehealth: Payer: Self-pay | Admitting: Emergency Medicine

## 2014-11-02 ENCOUNTER — Telehealth (HOSPITAL_BASED_OUTPATIENT_CLINIC_OR_DEPARTMENT_OTHER): Payer: Self-pay | Admitting: Emergency Medicine

## 2014-11-02 NOTE — ED Notes (Signed)
Pt called re rx for abx.  She reports that someone called her to tell her to stop taking the keflex and that person had called in a different rx in for her to pick up.  She states that her son went to pick the rx up and was told that it was not there.  Called [facility] to verify, pharm tech states that they had just found it and will be calling pt whenever the med is ready to be picked up.  Pt notified.

## 2014-11-02 NOTE — Telephone Encounter (Signed)
Post ED Visit - Positive Culture Follow-up: Successful Patient Follow-Up  Culture assessed and recommendations reviewed by: []  Heide Guile, Pharm.D., BCPS-AQ ID []  Alycia Rossetti, Pharm.D., BCPS []  Wahak [place]k, Pharm.D., BCPS, AAHIVP [x]  Legrand Como, Pharm.D., BCPS, AAHIVP []  Tegan Magsam, Pharm.D. []  Milus Glazier, [place].D.  Positive urine  Culture E.coli  []  Patient discharged without antimicrobial prescription and treatment is now indicated [x]  Organism is resistant to prescribed ED discharge antimicrobial []  Patient with positive blood cultures  Changes discussed with ED provider: Carlisle Cater PA New antibiotic prescription Stop Keflex, Cipro 500mg  po bid x 7days Called to [place] patient, 10/28/2014 1417   Hazle Nordmann 11/02/2014, 2:13 PM

## 2014-11-03 ENCOUNTER — Emergency Department (HOSPITAL_COMMUNITY)
Admission: EM | Admit: 2014-11-03 | Discharge: 2014-11-03 | Disposition: A | Discharge status: Home / Self Care | Payer: Medicare PPO | Attending: Emergency Medicine | Admitting: Emergency Medicine

## 2014-11-03 ENCOUNTER — Emergency Department (HOSPITAL_COMMUNITY): Payer: Medicare PPO

## 2014-11-03 ENCOUNTER — Encounter (HOSPITAL_COMMUNITY): Payer: Self-pay | Admitting: Emergency Medicine

## 2014-11-03 DIAGNOSIS — E860 Dehydration: Secondary | ICD-10-CM | POA: Diagnosis not present

## 2014-11-03 DIAGNOSIS — Z792 Long term (current) use of antibiotics: Secondary | ICD-10-CM | POA: Diagnosis not present

## 2014-11-03 DIAGNOSIS — N390 Urinary tract infection, site not specified: Secondary | ICD-10-CM | POA: Insufficient documentation

## 2014-11-03 DIAGNOSIS — Z79899 Other long term (current) drug therapy: Secondary | ICD-10-CM | POA: Diagnosis not present

## 2014-11-03 DIAGNOSIS — K219 Gastro-esophageal reflux disease without esophagitis: Secondary | ICD-10-CM | POA: Diagnosis not present

## 2014-11-03 DIAGNOSIS — Z8659 Personal history of other mental and behavioral disorders: Secondary | ICD-10-CM | POA: Diagnosis not present

## 2014-11-03 DIAGNOSIS — Z862 Personal history of diseases of the blood and blood-forming organs and certain disorders involving the immune mechanism: Secondary | ICD-10-CM | POA: Insufficient documentation

## 2014-11-03 DIAGNOSIS — M1990 Unspecified osteoarthritis, unspecified site: Secondary | ICD-10-CM | POA: Insufficient documentation

## 2014-11-03 DIAGNOSIS — R5383 Other fatigue: Secondary | ICD-10-CM | POA: Diagnosis present

## 2014-11-03 DIAGNOSIS — R531 Weakness: Secondary | ICD-10-CM

## 2014-11-03 LAB — TROPONIN I

## 2014-11-03 LAB — BASIC METABOLIC PANEL
ANION GAP: 12 (ref 5–15)
BUN: 22 mg/dL — ABNORMAL HIGH (ref 6–20)
CO2: 20 mmol/L — AB (ref 22–32)
Calcium: 9.3 mg/dL (ref 8.9–10.3)
Chloride: 104 mmol/L (ref 101–111)
Creatinine, Ser: 0.95 mg/dL (ref 0.44–1.00)
GFR calc Af Amer: >60 mL/min (ref >60)
GFR calc non Af Amer: 53 mL/min — ABNORMAL LOW (ref >60)
GLUCOSE: 116 mg/dL — AB (ref 65–99)
Potassium: 3.7 mmol/L (ref 3.5–5.1)
SODIUM: 136 mmol/L (ref 135–145)

## 2014-11-03 LAB — URINALYSIS, ROUTINE W REFLEX MICROSCOPIC
Bilirubin Urine: NEGATIVE
GLUCOSE, UA: NEGATIVE mg/dL
HGB URINE DIPSTICK: NEGATIVE
Ketones, ur: NEGATIVE mg/dL
Leukocytes, UA: NEGATIVE
Nitrite: NEGATIVE
PH: 7.5 (ref 5.0–8.0)
PROTEIN: NEGATIVE mg/dL
Specific Gravity, Urine: 1.008 (ref 1.005–1.030)
UROBILINOGEN UA: 0.2 mg/dL (ref 0.0–1.0)

## 2014-11-03 LAB — CBC WITH DIFFERENTIAL/PLATELET
Basophils Absolute: 0 10*3/uL (ref 0.0–0.1)
Basophils Relative: 1 % (ref 0–1)
EOS PCT: 2 % (ref 0–5)
Eosinophils Absolute: 0.2 10*3/uL (ref 0.0–0.7)
HEMATOCRIT: 34.9 % — AB (ref 36.0–46.0)
Hemoglobin: 11.5 g/dL — ABNORMAL LOW (ref 12.0–15.0)
Lymphocytes Relative: 24 % (ref 12–46)
Lymphs Abs: 2 10*3/uL (ref 0.7–4.0)
MCH: 28.3 pg (ref 26.0–34.0)
MCHC: 33 g/dL (ref 30.0–36.0)
MCV: 86 fL (ref 78.0–100.0)
Monocytes Absolute: 0.6 10*3/uL (ref 0.1–1.0)
Monocytes Relative: 7 % (ref 3–12)
NEUTROS ABS: 5.4 10*3/uL (ref 1.7–7.7)
Neutrophils Relative %: 66 % (ref 43–77)
PLATELETS: 433 10*3/uL — AB (ref 150–400)
RBC: 4.06 MIL/uL (ref 3.87–5.11)
RDW: 13.2 % (ref 11.5–15.5)
WBC: 8.2 10*3/uL (ref 4.0–10.5)

## 2014-11-03 MED ORDER — SODIUM CHLORIDE 0.9 % IV BOLUS (SEPSIS)
1000.0000 mL | Freq: Once | INTRAVENOUS | Status: AC
  Administered 2014-11-03: 1000 mL via INTRAVENOUS

## 2014-11-03 MED ORDER — DEXTROSE 5 % IV SOLN
1.0000 g | Freq: Once | INTRAVENOUS | Status: AC
  Administered 2014-11-03: 1 g via INTRAVENOUS
  Filled 2014-11-03: qty 10

## 2014-11-03 NOTE — ED Notes (Addendum)
Patient c/o chest pain PTA. Patient states pain was left chest and she feels nauseated and dizzy. Patient states her pain is currently 1/10. Patient states she was here "the other day for the same thing", when asked to explain as patient states this is the first time she's ever had chest pain she states she had a "urinary thing". Patient also c/o "my stomach hurts" and "my legs feel swollen 'but they aren't'". When patient is asked what her greatest complaint is today she states "I'm cold". Patient continues to add to her problem list for todays visit to include poor sleeping habits.   Patient was triaged by this writer on her visit 10/28/2014, patient complaints today mirror those complaints reported on 10/28/2014. Patient endorses most of her complaints of feeling weak, having poor appetite, and being cold have been ongoing for "years".

## 2014-11-03 NOTE — Discharge Instructions (Signed)
If you were given medicines take as directed.  If you are on coumadin or contraceptives realize their levels and effectiveness is altered by many different medicines.  If you have any reaction (rash, tongues swelling, other) to the medicines stop taking and see a physician.    If your blood pressure was elevated in the ER make sure you follow up for management with a primary doctor or return for chest pain, shortness of breath or stroke symptoms.  Please follow up as directed and return to the ER or see a physician for new or worsening symptoms.  Thank you. Filed Vitals:   11/03/14 0827  BP: 149/85  Pulse: 81  Temp: 97.6 F (36.4 C)  TempSrc: Oral  Resp: 22  SpO2: 98%

## 2014-11-03 NOTE — ED Provider Notes (Signed)
CSN: [id number]     Arrival date & time 11/03/14  0816 History   First MD Initiated Contact with Patient 11/03/14 [id number]     Chief Complaint  Patient presents with  . multiple complaints      (Consider location/radiation/quality/duration/timing/severity/associated sxs/prior Treatment) HPI Comments: 81 year old female with history of depression, urine infection, arthritis presents with general fatigue since being diagnosed with urine infection proximal me one week ago. Patient was called today's ago because the antibody she was on was resistant and she was changed to Cipro for which she started yesterday. Patient is tolerating oral but decreased appetite. No chest pain or shortness of breath currently. Patient very brief lasting a few minutes of chest pain last night similar to multiple up that she's had before. Patient's primary concern is urine infection in general fatigue. Symptoms are constant. Patient does not have any thyroid problems has been worked up for this in the past. Nonsmoker  The history is provided by the patient.    Past Medical History  Diagnosis Date  . Arthritis   . Anemia, unspecified   . Depression   . GERD (gastroesophageal reflux disease)   . Diverticulosis 2010    Colonoscopy  . Internal hemorrhoids 2010    Colonoscopy  . Hiatal hernia 2010    EGD   . Ulcerative (chronic) proctitis 2005    Colonoscopy    Past Surgical History  Procedure Laterality Date  . Abdominal hysterectomy    . Knee surgery    . Mastoidectomy     Family History  Problem Relation Age of Onset  . Breast cancer Mother   . Colon cancer Neg Hx    History  Substance Use Topics  . Smoking status: Never Smoker   . Smokeless tobacco: Never Used  . Alcohol Use: No   OB History    No data available     Review of Systems  Constitutional: Positive for fatigue. Negative for fever and chills.  HENT: Negative for congestion.   Eyes: Negative for visual disturbance.  Respiratory:  Negative for shortness of breath.   Cardiovascular: Negative for chest pain.  Gastrointestinal: Negative for vomiting and abdominal pain.  Genitourinary: Negative for dysuria and flank pain.  Musculoskeletal: Negative for back pain, neck pain and neck stiffness.  Skin: Negative for rash.  Neurological: Negative for light-headedness and headaches.      Allergies  Neosporin af  Home Medications   Prior to Admission medications   Medication Sig Start Date End Date Taking? Authorizing Provider  acetaminophen (TYLENOL) 500 MG tablet Take 500 mg by mouth every 6 (six) hours as needed for moderate pain or headache.   Yes Historical Provider, MD  cephALEXin (KEFLEX) 500 MG capsule Take 1 capsule (500 mg total) by mouth 3 (three) times daily. 10/28/14  Yes Tatyana Kirichenko, PA-C  ciprofloxacin (CIPRO) 500 MG tablet Take 500 mg by mouth 2 (two) times daily.   Yes Historical Provider, MD  diazepam (VALIUM) 2 MG tablet Take 2 mg by mouth at bedtime as needed for sedation.    Yes Historical Provider, MD  docusate sodium (COLACE) 100 MG capsule Take 100-200 mg by mouth daily as needed for mild constipation.   Yes Historical Provider, MD  ENSURE (ENSURE) Take 1 Can by mouth daily. Once or twice a week   Yes Historical Provider, MD  hydrocortisone cream 0.5 % Apply 1 application topically 2 (two) times daily as needed for itching.   Yes Historical Provider, MD  ipratropium (ATROVENT) 0.03 %  nasal spray Place 1 spray into both nostrils daily as needed for rhinitis (allergies).   Yes Historical Provider, MD  naproxen (NAPROSYN) 500 MG tablet Take 500 mg by mouth daily as needed for headache.   Yes Historical Provider, MD  omeprazole (PRILOSEC) 20 MG capsule Take 20 mg by mouth daily.   Yes Historical Provider, MD   BP 154/78 mmHg  Pulse 75  Temp(Src) 97.6 F (36.4 C) (Oral)  Resp 18  SpO2 94% Physical Exam  Constitutional: She is oriented to person, place, and time. She appears well-developed and  well-nourished.  HENT:  Head: Normocephalic and atraumatic.  Dry mucous membranes  Eyes: Conjunctivae are normal. Right eye exhibits no discharge. Left eye exhibits no discharge.  Neck: Normal range of motion. Neck supple. No tracheal deviation present.  Cardiovascular: Normal rate and regular rhythm.   Pulmonary/Chest: Effort normal and breath sounds normal.  Abdominal: Soft. She exhibits no distension. There is no tenderness. There is no guarding.  Musculoskeletal: She exhibits no edema.  Neurological: She is alert and oriented to person, place, and time. No cranial nerve deficit.  Skin: Skin is warm. No rash noted.  Psychiatric: She has a normal mood and affect.  Nursing note and vitals reviewed.   ED Course  Procedures (including critical care time) Labs Review Labs Reviewed  BASIC METABOLIC PANEL - Abnormal; Notable for the following:    CO2 20 (*)    Glucose, Bld 116 (*)    BUN 22 (*)    GFR calc non Af Amer 53 (*)    All other components within normal limits  CBC WITH DIFFERENTIAL/PLATELET - Abnormal; Notable for the following:    Hemoglobin 11.5 (*)    HCT 34.9 (*)    Platelets 433 (*)    All other components within normal limits  URINE CULTURE  TROPONIN I  URINALYSIS, ROUTINE W REFLEX MICROSCOPIC (NOT AT [facility]    Imaging Review Dg Chest 2 View  11/03/2014   CLINICAL DATA:  81 year old female with left chest pain, nausea and dizziness. Initial encounter.  EXAM: CHEST  2 VIEW  COMPARISON:  11/16/2006.  FINDINGS: Upright AP and lateral views of the chest. Moderate size hiatal hernia has increased since 2008. Other mediastinal contours are within normal limits. Lung volumes remain normal. Visualized tracheal air column is within normal limits. Mild curvilinear right upper lobe opacity most resembles scarring. No pneumothorax, pulmonary edema, pleural effusion or confluent pulmonary opacity. Osteopenia. Calcified atherosclerosis of the aorta. No acute osseous abnormality  identified.  IMPRESSION: 1.  No acute cardiopulmonary abnormality. 2. Increased chronic hiatal hernia.   Electronically Signed   By: Genevie Ann M.D.   On: 11/03/2014 09:02     EKG Interpretation   Date/Time:  Tuesday November 03 2014 08:27:51 EDT Ventricular Rate:  82 PR Interval:  124 QRS Duration: 81 QT Interval:  375 QTC Calculation: 438 R Axis:   13 Text Interpretation:  Sinus rhythm Atrial premature complex Low voltage,  precordial leads Baseline wander in lead(s) V3 Confirmed by Renleigh Ouellet  MD,  Brenae Lasecki ([id number]) on 11/03/2014 9:33:04 AM      MDM   Final diagnoses:  UTI (lower urinary tract infection)  General weakness  Dehydration   Patient clinically dehydrated, general weakness. No focal deficits. Concern for fatigue from recent urine infection. Rocephin given reviewed recent culture and urinalysis. Blood work overall unremarkable mild metabolic acidosis. IV fluids and oral fluids given. Patient stable for outpatient follow-up. Chest x-ray reviewed no acute findings  Results and differential diagnosis were discussed with the patient/parent/guardian. Close follow up outpatient was discussed, comfortable with the plan.   Medications  sodium chloride 0.9 % bolus 1,000 mL (1,000 mLs Intravenous New Bag/Given 11/03/14 1022)  cefTRIAXone (ROCEPHIN) 1 g in dextrose 5 % 50 mL IVPB (0 g Intravenous Stopped 11/03/14 1055)    Filed Vitals:   11/03/14 0830 11/03/14 0900 11/03/14 0930 11/03/14 1020  BP: 150/87 172/87 173/90 154/78  Pulse: 84 76 78 75  Temp:      TempSrc:      Resp: 17 18 14 18   SpO2: 98% 98% 98% 94%    Final diagnoses:  UTI (lower urinary tract infection)  General weakness  Dehydration        Elnora Morrison, MD 11/03/14 1127

## 2014-11-04 LAB — URINE CULTURE
COLONY COUNT: NO GROWTH
CULTURE: NO GROWTH

## 2014-11-27 ENCOUNTER — Ambulatory Visit: Payer: Self-pay | Admitting: Internal Medicine

## 2015-01-05 ENCOUNTER — Other Ambulatory Visit: Payer: Self-pay | Admitting: Gastroenterology

## 2015-01-29 ENCOUNTER — Emergency Department (HOSPITAL_COMMUNITY): Payer: Medicare PPO

## 2015-01-29 ENCOUNTER — Observation Stay (HOSPITAL_COMMUNITY)
Admission: EM | Admit: 2015-01-29 | Discharge: 2015-01-31 | Disposition: A | Discharge status: Home / Self Care | Payer: Medicare PPO | Attending: Internal Medicine | Admitting: Internal Medicine

## 2015-01-29 ENCOUNTER — Encounter (HOSPITAL_COMMUNITY): Payer: Self-pay | Admitting: Emergency Medicine

## 2015-01-29 DIAGNOSIS — K648 Other hemorrhoids: Secondary | ICD-10-CM | POA: Diagnosis not present

## 2015-01-29 DIAGNOSIS — K449 Diaphragmatic hernia without obstruction or gangrene: Secondary | ICD-10-CM | POA: Insufficient documentation

## 2015-01-29 DIAGNOSIS — K5190 Ulcerative colitis, unspecified, without complications: Secondary | ICD-10-CM | POA: Insufficient documentation

## 2015-01-29 DIAGNOSIS — R339 Retention of urine, unspecified: Secondary | ICD-10-CM | POA: Insufficient documentation

## 2015-01-29 DIAGNOSIS — R112 Nausea with vomiting, unspecified: Secondary | ICD-10-CM | POA: Insufficient documentation

## 2015-01-29 DIAGNOSIS — K219 Gastro-esophageal reflux disease without esophagitis: Secondary | ICD-10-CM | POA: Insufficient documentation

## 2015-01-29 DIAGNOSIS — Z791 Long term (current) use of non-steroidal anti-inflammatories (NSAID): Secondary | ICD-10-CM | POA: Insufficient documentation

## 2015-01-29 DIAGNOSIS — I10 Essential (primary) hypertension: Secondary | ICD-10-CM | POA: Diagnosis not present

## 2015-01-29 DIAGNOSIS — R079 Chest pain, unspecified: Secondary | ICD-10-CM | POA: Diagnosis not present

## 2015-01-29 DIAGNOSIS — N183 Chronic kidney disease, stage 3 (moderate): Secondary | ICD-10-CM | POA: Insufficient documentation

## 2015-01-29 DIAGNOSIS — K5900 Constipation, unspecified: Secondary | ICD-10-CM | POA: Diagnosis not present

## 2015-01-29 DIAGNOSIS — K5790 Diverticulosis of intestine, part unspecified, without perforation or abscess without bleeding: Secondary | ICD-10-CM | POA: Diagnosis not present

## 2015-01-29 DIAGNOSIS — M1990 Unspecified osteoarthritis, unspecified site: Secondary | ICD-10-CM | POA: Diagnosis not present

## 2015-01-29 DIAGNOSIS — R109 Unspecified abdominal pain: Secondary | ICD-10-CM

## 2015-01-29 DIAGNOSIS — K7689 Other specified diseases of liver: Secondary | ICD-10-CM | POA: Diagnosis not present

## 2015-01-29 DIAGNOSIS — R531 Weakness: Secondary | ICD-10-CM | POA: Diagnosis not present

## 2015-01-29 DIAGNOSIS — I129 Hypertensive chronic kidney disease with stage 1 through stage 4 chronic kidney disease, or unspecified chronic kidney disease: Secondary | ICD-10-CM | POA: Diagnosis not present

## 2015-01-29 DIAGNOSIS — Z79899 Other long term (current) drug therapy: Secondary | ICD-10-CM | POA: Insufficient documentation

## 2015-01-29 DIAGNOSIS — R1110 Vomiting, unspecified: Secondary | ICD-10-CM

## 2015-01-29 DIAGNOSIS — Z7952 Long term (current) use of systemic steroids: Secondary | ICD-10-CM | POA: Diagnosis not present

## 2015-01-29 DIAGNOSIS — R197 Diarrhea, unspecified: Secondary | ICD-10-CM | POA: Diagnosis not present

## 2015-01-29 LAB — BASIC METABOLIC PANEL
Anion gap: 13 (ref 5–15)
BUN: 18 mg/dL (ref 6–20)
CHLORIDE: 102 mmol/L (ref 101–111)
CO2: 18 mmol/L — AB (ref 22–32)
CREATININE: 1.01 mg/dL — AB (ref 0.44–1.00)
Calcium: 9.5 mg/dL (ref 8.9–10.3)
GFR calc non Af Amer: 50 mL/min — ABNORMAL LOW (ref >60)
GFR, EST AFRICAN AMERICAN: 58 mL/min — AB (ref >60)
Glucose, Bld: 104 mg/dL — ABNORMAL HIGH (ref 65–99)
Potassium: 3.7 mmol/L (ref 3.5–5.1)
Sodium: 133 mmol/L — ABNORMAL LOW (ref 135–145)

## 2015-01-29 LAB — URINALYSIS, ROUTINE W REFLEX MICROSCOPIC
BILIRUBIN URINE: NEGATIVE
GLUCOSE, UA: NEGATIVE mg/dL
HGB URINE DIPSTICK: NEGATIVE
KETONES UR: NEGATIVE mg/dL
Nitrite: NEGATIVE
PROTEIN: NEGATIVE mg/dL
Specific Gravity, Urine: 1.006 (ref 1.005–1.030)
Urobilinogen, UA: 0.2 mg/dL (ref 0.0–1.0)
pH: 6.5 (ref 5.0–8.0)

## 2015-01-29 LAB — I-STAT TROPONIN, ED: TROPONIN I, POC: 0.01 ng/mL (ref 0.00–0.08)

## 2015-01-29 LAB — HEPATIC FUNCTION PANEL
ALT: 17 U/L (ref 14–54)
AST: 20 U/L (ref 15–41)
Albumin: 4.2 g/dL (ref 3.5–5.0)
Alkaline Phosphatase: 59 U/L (ref 38–126)
Bilirubin, Direct: <0.1 mg/dL — ABNORMAL LOW (ref 0.1–0.5)
Total Bilirubin: 0.5 mg/dL (ref 0.3–1.2)
Total Protein: 7.3 g/dL (ref 6.5–8.1)

## 2015-01-29 LAB — CBC
HCT: 37.1 % (ref 36.0–46.0)
Hemoglobin: 12.3 g/dL (ref 12.0–15.0)
MCH: 27.5 pg (ref 26.0–34.0)
MCHC: 33.2 g/dL (ref 30.0–36.0)
MCV: 83 fL (ref 78.0–100.0)
Platelets: 402 10*3/uL — ABNORMAL HIGH (ref 150–400)
RBC: 4.47 MIL/uL (ref 3.87–5.11)
RDW: 15.3 % (ref 11.5–15.5)
WBC: 10.3 10*3/uL (ref 4.0–10.5)

## 2015-01-29 LAB — I-STAT CG4 LACTIC ACID, ED: Lactic Acid, Venous: 1.39 mmol/L (ref 0.5–2.0)

## 2015-01-29 LAB — URINE MICROSCOPIC-ADD ON

## 2015-01-29 LAB — LIPASE, BLOOD: LIPASE: 58 U/L — AB (ref 22–51)

## 2015-01-29 MED ORDER — PHENAZOPYRIDINE HCL 100 MG PO TABS
100.0000 mg | ORAL_TABLET | Freq: Three times a day (TID) | ORAL | Status: DC
  Administered 2015-01-29 – 2015-01-30 (×3): 100 mg via ORAL
  Filled 2015-01-29 (×5): qty 1

## 2015-01-29 MED ORDER — SODIUM CHLORIDE 0.9 % IJ SOLN
3.0000 mL | Freq: Two times a day (BID) | INTRAMUSCULAR | Status: DC
  Administered 2015-01-29 – 2015-01-31 (×4): 3 mL via INTRAVENOUS

## 2015-01-29 MED ORDER — DICYCLOMINE HCL 10 MG/ML IM SOLN
10.0000 mg | Freq: Once | INTRAMUSCULAR | Status: AC
  Administered 2015-01-29: 10 mg via INTRAMUSCULAR
  Filled 2015-01-29: qty 2

## 2015-01-29 MED ORDER — AMITRIPTYLINE HCL 10 MG PO TABS
10.0000 mg | ORAL_TABLET | Freq: Every day | ORAL | Status: DC
  Administered 2015-01-29: 10 mg via ORAL
  Filled 2015-01-29 (×2): qty 1

## 2015-01-29 MED ORDER — ACETAMINOPHEN 650 MG RE SUPP: 650.0000 mg | Freq: Four times a day (QID) | RECTAL | Status: DC | PRN

## 2015-01-29 MED ORDER — ONDANSETRON HCL 4 MG/2ML IJ SOLN
4.0000 mg | INTRAMUSCULAR | Status: DC | PRN
  Administered 2015-01-29: 4 mg via INTRAVENOUS
  Filled 2015-01-29: qty 2

## 2015-01-29 MED ORDER — HYDRALAZINE HCL 20 MG/ML IJ SOLN: 10.0000 mg | INTRAMUSCULAR | Status: DC | PRN

## 2015-01-29 MED ORDER — SODIUM CHLORIDE 0.9 % IV SOLN
INTRAVENOUS | Status: DC
  Administered 2015-01-29: 18:00:00 via INTRAVENOUS

## 2015-01-29 MED ORDER — ONDANSETRON HCL 4 MG PO TABS: 4.0000 mg | ORAL_TABLET | Freq: Four times a day (QID) | ORAL | Status: DC | PRN

## 2015-01-29 MED ORDER — ONDANSETRON HCL 4 MG/2ML IJ SOLN: 4.0000 mg | Freq: Three times a day (TID) | INTRAMUSCULAR | Status: DC | PRN

## 2015-01-29 MED ORDER — IOHEXOL 300 MG/ML  SOLN
100.0000 mL | Freq: Once | INTRAMUSCULAR | Status: AC | PRN
  Administered 2015-01-29: 80 mL via INTRAVENOUS

## 2015-01-29 MED ORDER — ENSURE PO LIQD: 1.0000 | Freq: Every day | ORAL | Status: DC

## 2015-01-29 MED ORDER — HYDROXYZINE HCL 25 MG PO TABS
25.0000 mg | ORAL_TABLET | Freq: Three times a day (TID) | ORAL | Status: DC | PRN
Start: 2015-01-29 — End: 2015-01-31
  Administered 2015-01-31: 25 mg via ORAL
  Filled 2015-01-29: qty 1

## 2015-01-29 MED ORDER — TAMSULOSIN HCL 0.4 MG PO CAPS
0.4000 mg | ORAL_CAPSULE | Freq: Every day | ORAL | Status: DC
  Administered 2015-01-29 – 2015-01-30 (×2): 0.4 mg via ORAL
  Filled 2015-01-29 (×2): qty 1

## 2015-01-29 MED ORDER — ACETAMINOPHEN 325 MG PO TABS: 650.0000 mg | ORAL_TABLET | Freq: Four times a day (QID) | ORAL | Status: DC | PRN

## 2015-01-29 MED ORDER — ONDANSETRON HCL 4 MG/2ML IJ SOLN
4.0000 mg | Freq: Four times a day (QID) | INTRAMUSCULAR | Status: DC | PRN
  Administered 2015-01-31: 4 mg via INTRAVENOUS
  Filled 2015-01-29 (×2): qty 2

## 2015-01-29 MED ORDER — MESALAMINE 1.2 G PO TBEC
1.2000 g | DELAYED_RELEASE_TABLET | Freq: Four times a day (QID) | ORAL | Status: DC
  Administered 2015-01-29 – 2015-01-30 (×2): 1.2 g via ORAL
  Filled 2015-01-29 (×9): qty 1

## 2015-01-29 MED ORDER — PANTOPRAZOLE SODIUM 40 MG PO TBEC
40.0000 mg | DELAYED_RELEASE_TABLET | Freq: Two times a day (BID) | ORAL | Status: DC
  Administered 2015-01-29 – 2015-01-31 (×4): 40 mg via ORAL
  Filled 2015-01-29 (×4): qty 1

## 2015-01-29 MED ORDER — PROMETHAZINE HCL 25 MG PO TABS
25.0000 mg | ORAL_TABLET | Freq: Every day | ORAL | Status: DC
Start: 2015-01-29 — End: 2015-01-30
  Administered 2015-01-29: 25 mg via ORAL
  Filled 2015-01-29: qty 1

## 2015-01-29 MED ORDER — SERTRALINE HCL 50 MG PO TABS
50.0000 mg | ORAL_TABLET | Freq: Every day | ORAL | Status: DC
  Administered 2015-01-30 – 2015-01-31 (×2): 50 mg via ORAL
  Filled 2015-01-29 (×2): qty 1

## 2015-01-29 MED ORDER — ENSURE ENLIVE PO LIQD
237.0000 mL | Freq: Two times a day (BID) | ORAL | Status: DC
  Administered 2015-01-30 – 2015-01-31 (×3): 237 mL via ORAL

## 2015-01-29 MED ORDER — FAMOTIDINE IN NACL 20-0.9 MG/50ML-% IV SOLN
20.0000 mg | Freq: Once | INTRAVENOUS | Status: AC
  Administered 2015-01-29: 20 mg via INTRAVENOUS
  Filled 2015-01-29: qty 50

## 2015-01-29 MED ORDER — PSYLLIUM 95 % PO PACK
1.0000 | PACK | Freq: Every day | ORAL | Status: DC
  Administered 2015-01-30 – 2015-01-31 (×2): 1 via ORAL
  Filled 2015-01-29 (×2): qty 1

## 2015-01-29 MED ORDER — FLORA-Q PO CAPS
1.0000 | ORAL_CAPSULE | Freq: Every day | ORAL | Status: DC
  Administered 2015-01-30 – 2015-01-31 (×2): 1 via ORAL
  Filled 2015-01-29 (×2): qty 1

## 2015-01-29 MED ORDER — HEPARIN SODIUM (PORCINE) 5000 UNIT/ML IJ SOLN
5000.0000 [IU] | Freq: Three times a day (TID) | INTRAMUSCULAR | Status: DC
  Administered 2015-01-29 – 2015-01-31 (×5): 5000 [IU] via SUBCUTANEOUS
  Filled 2015-01-29 (×5): qty 1

## 2015-01-29 MED ORDER — SODIUM CHLORIDE 0.9 % IV SOLN
INTRAVENOUS | Status: DC
  Administered 2015-01-29 – 2015-01-31 (×3): via INTRAVENOUS

## 2015-01-29 NOTE — ED Notes (Signed)
Per pt, states recently diagnosed with diverticulitis-started having upper abdominal, chest pain "like a belt is tightening around here-difficulty swallowing-unable to keep fluids down-15 pound weight loss, weakness

## 2015-01-29 NOTE — ED Notes (Signed)
Pt became unsteady and unable to stand for osvs

## 2015-01-29 NOTE — ED Provider Notes (Addendum)
CSN: [id number]     Arrival date & time 01/29/15  1636 History   First MD Initiated Contact with Patient 01/29/15 1723     Chief Complaint  Patient presents with  . Chest Pain  . Nausea  . Abdominal Pain     HPI  Pt was seen at 1730.  Per pt's family and pt, c/o gradual onset and persistence of constant generalized abd "pain" since yesterday.  Has been associated with multiple intermittent episodes of N/V for the past 2 to 3 months, worse over the past 2 to 3 weeks.  Describes the abd pain as "tightness" and "aching." States the nausea and abd pain both radiate up into her chest since yesterday.  States she was constipated (having hard stools) for the past several days, took a stool softener yesterday with resultant BM's, but her abd pain and N/V did not improve.  Pt was evaluated by her GI MD for same, had a colonoscopy, was "dx with colitis," rx lialda.  Denies diarrhea, no fevers, no back pain, no rash, no CP/SOB, no black or blood in stools or emesis.      Past Medical History  Diagnosis Date  . Arthritis   . Anemia, unspecified   . Depression   . GERD (gastroesophageal reflux disease)   . Diverticulosis 2010    Colonoscopy  . Internal hemorrhoids 2010    Colonoscopy  . Hiatal hernia 2010    EGD   . Ulcerative (chronic) proctitis 2005    Colonoscopy   . Osteoarthritis   . Migraine   . Kidney disease, chronic, stage III (moderate, EGFR 30-59 ml/min)   . Shingles   . Hot flashes   . UTI (urinary tract infection)    Past Surgical History  Procedure Laterality Date  . Abdominal hysterectomy    . Knee surgery    . Mastoidectomy    . Tonsillectomy    . Nose surgery     Family History  Problem Relation Age of Onset  . Breast cancer Mother   . Colon cancer Neg Hx   . Breast cancer Sister 91  . Stroke Mother 49  . Diabetes Son 45  . Prostate cancer Son    Social History  Substance Use Topics  . Smoking status: Never Smoker   . Smokeless tobacco: Never Used  . Alcohol  Use: No    Review of Systems ROS: Statement: All systems negative except as marked or noted in the HPI; Constitutional: Negative for fever and chills. +generalized weakness/fatigue. ; ; Eyes: Negative for eye pain, redness and discharge. ; ; ENMT: Negative for ear pain, hoarseness, nasal congestion, sinus pressure and sore throat. ; ; Cardiovascular: Negative for chest pain, palpitations, diaphoresis, dyspnea and peripheral edema. ; ; Respiratory: Negative for cough, wheezing and stridor. ; ; Gastrointestinal: +abd pain, N/V. Negative for blood in stool, hematemesis, jaundice and rectal bleeding. . ; ; Genitourinary: Negative for dysuria, flank pain and hematuria. ; ; Musculoskeletal: Negative for back pain and neck pain. Negative for swelling and trauma.; ; Skin: Negative for pruritus, rash, abrasions, blisters, bruising and skin lesion.; ; Neuro: Negative for headache, lightheadedness and neck stiffness. Negative for altered level of consciousness , altered mental status, extremity weakness, paresthesias, involuntary movement, seizure and syncope.      Allergies  Neosporin af  Home Medications   Prior to Admission medications   Medication Sig Start Date End Date Taking? Authorizing Provider  acetaminophen (TYLENOL) 500 MG tablet Take 1,000 mg by mouth  every 6 (six) hours as needed for moderate pain or headache.    Yes Historical Provider, MD  diazepam (VALIUM) 2 MG tablet Take 2 mg by mouth at bedtime as needed for sedation.    Yes Historical Provider, MD  docusate sodium (COLACE) 100 MG capsule Take 100-200 mg by mouth daily as needed for mild constipation.   Yes Historical Provider, MD  ENSURE (ENSURE) Take 1 Can by mouth daily. Once or twice a week   Yes Historical Provider, MD  hydrocortisone cream 0.5 % Apply 1 application topically 2 (two) times daily as needed for itching.   Yes Historical Provider, MD  LIALDA 1.2 G EC tablet Take 1.2 g by mouth 4 (four) times daily.  01/05/15  Yes  Historical Provider, MD  naproxen (NAPROSYN) 500 MG tablet Take 500 mg by mouth daily as needed for headache.   Yes Historical Provider, MD  omeprazole (PRILOSEC) 20 MG capsule Take 20 mg by mouth daily.   Yes Historical Provider, MD  ondansetron (ZOFRAN) 4 MG tablet Take 1 tablet by mouth daily as needed for nausea.  01/26/15  Yes Historical Provider, MD  promethazine (PHENERGAN) 25 MG tablet Take 25 mg by mouth at bedtime. 01/25/15  Yes Historical Provider, MD   BP 138/90 mmHg  Pulse 90  Temp(Src) 97.7 F (36.5 C) (Oral)  Resp 19  SpO2 97%   18:26 Orthostatic Vital Signs BA  Orthostatic Lying  - BP- Lying: 161/79 mmHg ; Pulse- Lying: 78  Orthostatic Sitting - BP- Sitting: 164/92 mmHg ; Pulse- Sitting: 92  Orthostatic Standing at 0 minutes - Pulse- Standing at 0 minutes: 98      Physical Exam 1735: Physical examination:  Nursing notes reviewed; Vital signs and O2 SAT reviewed;  Constitutional: Well developed, Well nourished, Well hydrated, In no acute distress; Head:  Normocephalic, atraumatic; Eyes: EOMI, PERRL, No scleral icterus; ENMT: Mouth and pharynx normal, Mucous membranes moist; Neck: Supple, Full range of motion, No lymphadenopathy; Cardiovascular: Regular rate and rhythm, No gallop; Respiratory: Breath sounds clear & equal bilaterally, No wheezes.  Speaking full sentences with ease, Normal respiratory effort/excursion; Chest: Nontender, Movement normal; Abdomen: Soft, +mild diffuse tenderness to palp. No rebound or guarding. Nondistended, Normal bowel sounds; Genitourinary: No CVA tenderness; Extremities: Pulses normal, No tenderness, No edema, No calf edema or asymmetry.; Neuro: AA&Ox3, Major CN grossly intact.  Speech clear. No gross focal motor or sensory deficits in extremities.; Skin: Color normal, Warm, Dry.; Psych:  Anxious, rapid speech.    ED Course  Procedures (including critical care time) Labs Review   Imaging Review  I have personally reviewed and evaluated  these images and lab results as part of my medical decision-making.   EKG Interpretation   Date/Time:  Friday January 29 2015 16:46:17 EDT Ventricular Rate:  93 PR Interval:  129 QRS Duration: 82 QT Interval:  386 QTC Calculation: 480 R Axis:   -19 Text Interpretation:  Sinus rhythm Supraventricular bigeminy Left axis  deviation Anterior infarct, old Artifact When compared with ECG of  11/03/2014 No significant change was found Confirmed by [facility]  MD,  Nunzio Cory [phone]) on 01/29/2015 5:26:54 PM      MDM  MDM Reviewed: previous chart, nursing note and vitals Reviewed previous: labs and ECG Interpretation: labs, ECG, x-ray and CT scan     Results for orders placed or performed during the hospital encounter of 51/10/21  Basic metabolic panel  Result Value Ref Range   Sodium 133 (L) 135 - 145 mmol/L  Potassium 3.7 3.5 - 5.1 mmol/L   Chloride 102 101 - 111 mmol/L   CO2 18 (L) 22 - 32 mmol/L   Glucose, Bld 104 (H) 65 - 99 mg/dL   BUN 18 6 - 20 mg/dL   Creatinine, Ser 1.01 (H) 0.44 - 1.00 mg/dL   Calcium 9.5 8.9 - 10.3 mg/dL   GFR calc non Af Amer 50 (L) >60 mL/min   GFR calc Af Amer 58 (L) >60 mL/min   Anion gap 13 5 - 15  CBC  Result Value Ref Range   WBC 10.3 4.0 - 10.5 K/uL   RBC 4.47 3.87 - 5.11 MIL/uL   Hemoglobin 12.3 12.0 - 15.0 g/dL   HCT 37.1 36.0 - 46.0 %   MCV 83.0 78.0 - 100.0 fL   MCH 27.5 26.0 - 34.0 pg   MCHC 33.2 30.0 - 36.0 g/dL   RDW 15.3 11.5 - 15.5 %   Platelets 402 (H) 150 - 400 K/uL  Hepatic function panel  Result Value Ref Range   Total Protein 7.3 6.5 - 8.1 g/dL   Albumin 4.2 3.5 - 5.0 g/dL   AST 20 15 - 41 U/L   ALT 17 14 - 54 U/L   Alkaline Phosphatase 59 38 - 126 U/L   Total Bilirubin 0.5 0.3 - 1.2 mg/dL   Bilirubin, Direct <0.1 (L) 0.1 - 0.5 mg/dL   Indirect Bilirubin NOT CALCULATED 0.3 - 0.9 mg/dL  Lipase, blood  Result Value Ref Range   Lipase 58 (H) 22 - 51 U/L  Urinalysis, Routine w reflex microscopic  Result Value Ref Range    Color, Urine STRAW (A) YELLOW   APPearance CLEAR CLEAR   Specific Gravity, Urine 1.006 1.005 - 1.030   pH 6.5 5.0 - 8.0   Glucose, UA NEGATIVE NEGATIVE mg/dL   Hgb urine dipstick NEGATIVE NEGATIVE   Bilirubin Urine NEGATIVE NEGATIVE   Ketones, ur NEGATIVE NEGATIVE mg/dL   Protein, ur NEGATIVE NEGATIVE mg/dL   Urobilinogen, UA 0.2 0.0 - 1.0 mg/dL   Nitrite NEGATIVE NEGATIVE   Leukocytes, UA TRACE (A) NEGATIVE  Urine microscopic-add on  Result Value Ref Range   Squamous Epithelial / LPF FEW (A) RARE   WBC, UA 0-2 <3 WBC/hpf  I-stat troponin, ED  Result Value Ref Range   Troponin i, poc 0.01 0.00 - 0.08 ng/mL   Comment 3          I-Stat CG4 Lactic Acid, ED  Result Value Ref Range   Lactic Acid, Venous 1.39 0.5 - 2.0 mmol/L   Dg Chest 2 View 01/29/2015   CLINICAL DATA:  81 year old female with lower chest pressure and shortness of breath with nonproductive cough past 2 days.  EXAM: CHEST  2 VIEW  COMPARISON:  Chest x-ray 11/03/2014.  FINDINGS: Lung volumes are normal. No consolidative airspace disease. No pleural effusions. No evidence of pulmonary edema. Heart size is normal. Upper mediastinal contours are within normal limits. Atherosclerosis in the thoracic aorta. Large hiatal hernia.  IMPRESSION: 1. No radiographic evidence of acute cardiopulmonary disease. 2. Large hiatal hernia. 3. Atherosclerosis.   Electronically Signed   By: Vinnie Langton M.D.   On: 01/29/2015 19:35   Ct Abdomen Pelvis W Contrast 01/29/2015   CLINICAL DATA:  Recently diagnosed with diverticulitis. Upper abdominal and chest pain like a belt is tightening. Difficulty swallowing. Unable to keep fluids down. 15 pound weight loss. Weakness.  EXAM: CT ABDOMEN AND PELVIS WITH CONTRAST  TECHNIQUE: Multidetector CT imaging of the  abdomen and pelvis was performed using the standard protocol following bolus administration of intravenous contrast.  CONTRAST:  60m OMNIPAQUE IOHEXOL 300 MG/ML  SOLN  COMPARISON:  CT of the  pelvis 11/19/2006  FINDINGS: Lower chest: Large hiatal hernia is present. There is scarring or atelectasis in the anterior aspects of the lungs bilaterally. The heart is mildly enlarged. No pericardial effusion.  Upper abdomen: Within the left hepatic lobe there is a low-attenuation lesion which measures 7.3 x 8.3 cm. Although low-attenuation this does not represent a simple cyst by Hounsfield unit measurements. A thin calcifications identified along the superior aspect of the lesion. A small adjacent lesion is identified within the lateral segment of left hepatic lobe measuring 2.5 x 1.1 cm and not further characterized.  No focal abnormality identified within the spleen, pancreas, or adrenal glands. Small left renal cyst is identified. The right kidney is somewhat ptotic in position possibly related to patient's scoliosis no hydronephrosis. Gallbladder is present.  Gastrointestinal tract: Large hiatal hernia. Small bowel loops are normal in caliber. There is a right inguinal hernia which does contain nondilated loops of small bowel and appears uncomplicated. The appendix is not well seen. Colonic diverticula are present no acute diverticulitis identified.  Pelvis: Urinary bladder is significantly distended. Status post hysterectomy. No adnexal mass or free pelvic fluid.  Retroperitoneum: There is atherosclerotic calcification of the abdominal aorta. Aorta is tortuous but not aneurysmal. No retroperitoneal or mesenteric adenopathy.  Abdominal wall: Right inguinal hernia containing nondilated loops of small bowel.  Osseous structures: There are significant degenerative changes in the thoracolumbar spine, associated with scoliosis. No suspicious lytic or blastic lesions are identified.  IMPRESSION: 1. 8.3 cm indeterminate low-attenuation lesion and smaller 2.5 cm lesion within the left hepatic lobe. Further evaluation with MRI is recommended. MRI should be performed when the patient is clinically stable and able to  follow breath holding instructions (usually best performed on an outpatient basis). 2. Large hiatal hernia. 3. Mild cardiomegaly. 4. Colonic diverticulosis. 5. Status post hysterectomy. 6. Right inguinal hernia containing small bowel loops.   Electronically Signed   By: ENolon NationsM.D.   On: 01/29/2015 20:32    2045:   Pt continues anxious, c/o multiple symptoms. Pt states she feels she is "urinating a lot since you gave me that IV fluid and CT scan fluid." Pt reassured there was no UTI on Udip. Unable to stand for orthostatic VS due to c/o feeling "too weak." Family requesting admission because pt is "too weak."  T/C to [facility]. PPosey Pronto case discussed, including:  HPI, pertinent PM/SHx, VS/PE, dx testing, ED course and treatment:  Agreeable to come to ED for evaluation.   2215:  [facility] Dr. PPosey Prontohas evaluated pt: will observation admit.     KFrancine Graven DO 02/02/15 1718

## 2015-01-29 NOTE — ED Notes (Signed)
Attempted to get OSVS. Pt wanting to use bedpan first. Pt unwilling to be undressed at this time.

## 2015-01-30 DIAGNOSIS — R339 Retention of urine, unspecified: Secondary | ICD-10-CM

## 2015-01-30 DIAGNOSIS — K7689 Other specified diseases of liver: Secondary | ICD-10-CM | POA: Diagnosis present

## 2015-01-30 DIAGNOSIS — K51919 Ulcerative colitis, unspecified with unspecified complications: Secondary | ICD-10-CM

## 2015-01-30 DIAGNOSIS — R531 Weakness: Secondary | ICD-10-CM

## 2015-01-30 DIAGNOSIS — I10 Essential (primary) hypertension: Secondary | ICD-10-CM

## 2015-01-30 DIAGNOSIS — R1110 Vomiting, unspecified: Secondary | ICD-10-CM | POA: Diagnosis not present

## 2015-01-30 DIAGNOSIS — K449 Diaphragmatic hernia without obstruction or gangrene: Secondary | ICD-10-CM

## 2015-01-30 LAB — CBC
HEMATOCRIT: 34.5 % — AB (ref 36.0–46.0)
HEMOGLOBIN: 11.5 g/dL — AB (ref 12.0–15.0)
MCH: 27.7 pg (ref 26.0–34.0)
MCHC: 33.3 g/dL (ref 30.0–36.0)
MCV: 83.1 fL (ref 78.0–100.0)
Platelets: 309 10*3/uL (ref 150–400)
RBC: 4.15 MIL/uL (ref 3.87–5.11)
RDW: 15.2 % (ref 11.5–15.5)
WBC: 9.7 10*3/uL (ref 4.0–10.5)

## 2015-01-30 LAB — COMPREHENSIVE METABOLIC PANEL
ALK PHOS: 51 U/L (ref 38–126)
ALT: 14 U/L (ref 14–54)
ANION GAP: 7 (ref 5–15)
AST: 18 U/L (ref 15–41)
Albumin: 3.5 g/dL (ref 3.5–5.0)
BUN: 12 mg/dL (ref 6–20)
CALCIUM: 8.7 mg/dL — AB (ref 8.9–10.3)
CO2: 21 mmol/L — AB (ref 22–32)
Chloride: 103 mmol/L (ref 101–111)
Creatinine, Ser: 0.99 mg/dL (ref 0.44–1.00)
GFR calc non Af Amer: 51 mL/min — ABNORMAL LOW (ref >60)
GFR, EST AFRICAN AMERICAN: 59 mL/min — AB (ref >60)
Glucose, Bld: 98 mg/dL (ref 65–99)
POTASSIUM: 3.3 mmol/L — AB (ref 3.5–5.1)
SODIUM: 131 mmol/L — AB (ref 135–145)
TOTAL PROTEIN: 6.2 g/dL — AB (ref 6.5–8.1)
Total Bilirubin: 1.1 mg/dL (ref 0.3–1.2)

## 2015-01-30 LAB — SEDIMENTATION RATE: Sed Rate: 11 mm/hr (ref 0–22)

## 2015-01-30 LAB — PROTIME-INR
INR: 1.1 (ref 0.00–1.49)
Prothrombin Time: 14.4 seconds (ref 11.6–15.2)

## 2015-01-30 LAB — C-REACTIVE PROTEIN

## 2015-01-30 MED ORDER — POTASSIUM CHLORIDE CRYS ER 20 MEQ PO TBCR
40.0000 meq | EXTENDED_RELEASE_TABLET | Freq: Once | ORAL | Status: AC
  Administered 2015-01-30: 40 meq via ORAL
  Filled 2015-01-30: qty 2

## 2015-01-30 NOTE — H&P (Signed)
[facility] History and Physical  Patient: Stacy Mcguire  MRN: [id number]  DOB: January 23, 1928  DOS: the patient was seen and examined on 01/29/2015 PCP: Elton Sin, FNP  Referring physician: Dr. Thurnell Garbe Chief Complaint: Abdominal pain and weakness  HPI: Stacy Mcguire is a 81 y.o. female with Past medical history of chronic anemia, depression, GERD, hiatal hernia, ulcerative colitis, chronic kidney disease, recurrent UTI. The patient is presenting with complains of abdominal pain. The pain she has is located in the suprapubic area and she mentions that the pain is radiating straight up to her epigastrium. The pain feels like sharp crampy pain and has been worse during the morning but present throughout the day. This pain has been present since last 2 months and has been progressively worsening today. This is also associated with chronic nausea. Patient has not been eating and drinking and has significant weight loss. Patient recently had an sigmoidoscopy as an outpatient and was found to be having active colitis and was placed on Lialda-mesalamine by GI Dr. Amedeo Plenty. Patient also has been found to have recurrent UTI with 3 antibiotics course last month. Patient mentions that she could have burning urination again. She denies any fever or chills. Denies having any chest pain denies having any fall trauma or injury. She has chronic constipation and the she would take some stool softener which will be over-the-counter and then she will have diarrhea and then she would have constipation again. She denies having any active bleeding with stool. She mentions that she is so weak that she cannot stand up and she mentions that this is because of severe pain in the stomach as well as difficulty to eat. Although she denies having any focal deficit.  The patient is coming from home.  At her baseline ambulates with support And is independent for most of her ADL manages her medication on her  own.  Review of Systems: as mentioned in the history of present illness.  A comprehensive review of the other systems is negative.  Past Medical History  Diagnosis Date  . Arthritis   . Anemia, unspecified   . Depression   . GERD (gastroesophageal reflux disease)   . Diverticulosis 2010    Colonoscopy  . Internal hemorrhoids 2010    Colonoscopy  . Hiatal hernia 2010    EGD   . Ulcerative (chronic) proctitis 2005    Colonoscopy   . Osteoarthritis   . Migraine   . Kidney disease, chronic, stage III (moderate, EGFR 30-59 ml/min)   . Shingles   . Hot flashes   . UTI (urinary tract infection)    Past Surgical History  Procedure Laterality Date  . Abdominal hysterectomy    . Knee surgery    . Mastoidectomy    . Tonsillectomy    . Nose surgery     Social History:  reports that she has never smoked. She has never used smokeless tobacco. She reports that she does not drink alcohol or use illicit drugs.  Allergies  Allergen Reactions  . Neosporin Af [Miconazole] Itching    Family History  Problem Relation Age of Onset  . Breast cancer Mother   . Colon cancer Neg Hx   . Breast cancer Sister 63  . Stroke Mother 27  . Diabetes Son 43  . Prostate cancer Son     Prior to Admission medications   Medication Sig Start Date End Date Taking? Authorizing Provider  acetaminophen (TYLENOL) 500 MG tablet Take 1,000 mg by  mouth every 6 (six) hours as needed for moderate pain or headache.    Yes Historical Provider, MD  docusate sodium (COLACE) 100 MG capsule Take 100-200 mg by mouth daily as needed for mild constipation.   Yes Historical Provider, MD  ENSURE (ENSURE) Take 1 Can by mouth daily. Once or twice a week   Yes Historical Provider, MD  hydrocortisone cream 0.5 % Apply 1 application topically 2 (two) times daily as needed for itching.   Yes Historical Provider, MD  LIALDA 1.2 G EC tablet Take 1.2 g by mouth 4 (four) times daily.  01/05/15  Yes Historical Provider, MD  naproxen  (NAPROSYN) 500 MG tablet Take 500 mg by mouth daily as needed for headache.   Yes Historical Provider, MD  omeprazole (PRILOSEC) 20 MG capsule Take 20 mg by mouth daily.   Yes Historical Provider, MD  ondansetron (ZOFRAN) 4 MG tablet Take 1 tablet by mouth daily as needed for nausea.  01/26/15  Yes Historical Provider, MD  Probiotic Product (ALIGN PO) Take 1 capsule by mouth daily.   Yes Historical Provider, MD  promethazine (PHENERGAN) 25 MG tablet Take 25 mg by mouth at bedtime. 01/25/15  Yes Historical Provider, MD  sertraline (ZOLOFT) 50 MG tablet Take 1 tablet by mouth daily. 01/14/15  Yes Historical Provider, MD  traMADol (ULTRAM) 50 MG tablet Take 50 mg by mouth every 4 (four) hours as needed. for pain 12/25/14  Yes Historical Provider, MD  Vitamin D, Ergocalciferol, (DRISDOL) 50000 UNITS CAPS capsule Take 50,000 Units by mouth daily.   Yes Historical Provider, MD  diazepam (VALIUM) 2 MG tablet Take 2 mg by mouth at bedtime as needed for sedation.     Historical Provider, MD  diphenhydrAMINE (SOMINEX) 25 MG tablet Take 25 mg by mouth at bedtime as needed for sleep.    Historical Provider, MD    Physical Exam: Filed Vitals:   01/29/15 2100 01/29/15 2200 01/29/15 2318 01/30/15 0454  BP: 194/102 173/102 166/87 117/63  Pulse: 83 81 78 92  Temp:   97.9 F (36.6 C) 98 F (36.7 C)  TempSrc:   Oral Oral  Resp: '15 21 20 18  ' Height:   5' 5.25" (1.657 m)   Weight:   50.2 kg (110 lb 10.7 oz) 51 kg (112 lb 7 oz)  SpO2: 99% 98% 100% 96%    General: Alert, Awake and Oriented to Time, Place and Person. Appear in moderate distress Eyes: PERRL ENT: Oral Mucosa clear moist. Neck: no JVD Cardiovascular: S1 and S2 Present, no Murmur, Peripheral Pulses Present Respiratory: Bilateral Air entry equal and Decreased,  Clear to Auscultation, no Crackles, no wheezes Abdomen: Bowel Sound present, Soft and suprapubic tenderness Skin: no Rash Extremities: no Pedal edema, no calf tenderness Neurologic:  Grossly no focal neuro deficit.  Labs on Admission:  CBC:  Recent Labs Lab 01/29/15 1709  WBC 10.3  HGB 12.3  HCT 37.1  MCV 83.0  PLT 402*    CMP     Component Value Date/Time   NA 133* 01/29/2015 1709   K 3.7 01/29/2015 1709   CL 102 01/29/2015 1709   CO2 18* 01/29/2015 1709   GLUCOSE 104* 01/29/2015 1709   BUN 18 01/29/2015 1709   CREATININE 1.01* 01/29/2015 1709   CALCIUM 9.5 01/29/2015 1709   PROT 7.3 01/29/2015 1709   ALBUMIN 4.2 01/29/2015 1709   AST 20 01/29/2015 1709   ALT 17 01/29/2015 1709   ALKPHOS 59 01/29/2015 1709   BILITOT 0.5  01/29/2015 1709   GFRNONAA 50* 01/29/2015 1709   GFRAA 58* 01/29/2015 1709     Recent Labs Lab 01/29/15 1709  LIPASE 58*    No results for input(s): CKTOTAL, CKMB, CKMBINDEX, TROPONINI in the last 168 hours. BNP (last 3 results) No results for input(s): BNP in the last 8760 hours.  ProBNP (last 3 results) No results for input(s): PROBNP in the last 8760 hours.   Radiological Exams on Admission: Dg Chest 2 View  01/29/2015   CLINICAL DATA:  81 year old female with lower chest pressure and shortness of breath with nonproductive cough past 2 days.  EXAM: CHEST  2 VIEW  COMPARISON:  Chest x-ray 11/03/2014.  FINDINGS: Lung volumes are normal. No consolidative airspace disease. No pleural effusions. No evidence of pulmonary edema. Heart size is normal. Upper mediastinal contours are within normal limits. Atherosclerosis in the thoracic aorta. Large hiatal hernia.  IMPRESSION: 1. No radiographic evidence of acute cardiopulmonary disease. 2. Large hiatal hernia. 3. Atherosclerosis.   Electronically Signed   By: Vinnie Langton M.D.   On: 01/29/2015 19:35   Ct Abdomen Pelvis W Contrast  01/29/2015   CLINICAL DATA:  Recently diagnosed with diverticulitis. Upper abdominal and chest pain like a belt is tightening. Difficulty swallowing. Unable to keep fluids down. 15 pound weight loss. Weakness.  EXAM: CT ABDOMEN AND PELVIS WITH  CONTRAST  TECHNIQUE: Multidetector CT imaging of the abdomen and pelvis was performed using the standard protocol following bolus administration of intravenous contrast.  CONTRAST:  72m OMNIPAQUE IOHEXOL 300 MG/ML  SOLN  COMPARISON:  CT of the pelvis 11/19/2006  FINDINGS: Lower chest: Large hiatal hernia is present. There is scarring or atelectasis in the anterior aspects of the lungs bilaterally. The heart is mildly enlarged. No pericardial effusion.  Upper abdomen: Within the left hepatic lobe there is a low-attenuation lesion which measures 7.3 x 8.3 cm. Although low-attenuation this does not represent a simple cyst by Hounsfield unit measurements. A thin calcifications identified along the superior aspect of the lesion. A small adjacent lesion is identified within the lateral segment of left hepatic lobe measuring 2.5 x 1.1 cm and not further characterized.  No focal abnormality identified within the spleen, pancreas, or adrenal glands. Small left renal cyst is identified. The right kidney is somewhat ptotic in position possibly related to patient's scoliosis no hydronephrosis. Gallbladder is present.  Gastrointestinal tract: Large hiatal hernia. Small bowel loops are normal in caliber. There is a right inguinal hernia which does contain nondilated loops of small bowel and appears uncomplicated. The appendix is not well seen. Colonic diverticula are present no acute diverticulitis identified.  Pelvis: Urinary bladder is significantly distended. Status post hysterectomy. No adnexal mass or free pelvic fluid.  Retroperitoneum: There is atherosclerotic calcification of the abdominal aorta. Aorta is tortuous but not aneurysmal. No retroperitoneal or mesenteric adenopathy.  Abdominal wall: Right inguinal hernia containing nondilated loops of small bowel.  Osseous structures: There are significant degenerative changes in the thoracolumbar spine, associated with scoliosis. No suspicious lytic or blastic lesions are  identified.  IMPRESSION: 1. 8.3 cm indeterminate low-attenuation lesion and smaller 2.5 cm lesion within the left hepatic lobe. Further evaluation with MRI is recommended. MRI should be performed when the patient is clinically stable and able to follow breath holding instructions (usually best performed on an outpatient basis). 2. Large hiatal hernia. 3. Mild cardiomegaly. 4. Colonic diverticulosis. 5. Status post hysterectomy. 6. Right inguinal hernia containing small bowel loops.   Electronically Signed  By: Nolon Nations M.D.   On: 01/29/2015 20:32   Assessment/Plan Principal Problem:   Accelerated hypertension Active Problems:   GERD   Ulcerative colitis   Constipation   Intractable nausea and vomiting   Hiatal hernia   Urinary retention   Liver cyst   Weakness generalized   1. Accelerated hypertension The patient is presenting with multiple complaints. She appears to be significantly anxious. She does not have any prior history of high blood pressure and her blood pressure is currently significantly elevated. She will be admitted in the hospital and will be treated with anxiety medication as well as IV hydralazine when necessary. He'll be monitored on telemetry.  2. abdominal pain with urinary retention and intractable nausea and vomiting and hiatal hernia. The patient has multiple abdominal complaints . CT of the abdomen was performed which shows large bladder and a large liver cyst with minimal focal calcification and a large hiatal hernia. He does not show any acute abnormality. The patient is found to having significant urinary retention which can explain patient's abdominal pain in the suprapubic area. The patient is also complaining that this pain has been present for a while and that is a possibility that she could have interstitial cystitis as she has been found to be treated with recurrent UTI for 3 in the last 1 month.  With this I would put her on low-dose  amitriptyline. Also I would use pyridum. Would also use hydroxyzine daily at bedtime. Foley catheter is inserted and she will be placed on Flomax. She will require outpatient urology follow-up.  3.Nausea without vomiting. The patient has chronic nausea without any vomiting and has not been eating because of this nausea. Patient mentions whenever she tries to eat she feels nauseous but also she never had any episodes of vomiting. Patient was encouraged to eat as much as she can tolerate without any vomiting. Patient was also encouraged to use when necessary Zofran 30 minutes before meals. Patient was also encouraged to eat small frequent meals. PPI dose should be twice a day before meals. Most probably that is also a large component of hiatal hernia causing pressure symptoms. And she will require an outpatient workup for the same.  4. diarrhea followed by constipation. The patient has recurrent diarrhea followed by constipation and she is also has history of ulcerative colitis. She was recently found to be having mild active colitis. Dr. Amedeo Plenty is her GI. She will be closely monitored here in the hospital although she does not appear to be having any diarrhea at present. Recommended her not to receive any narcotic pain medication because of follow-up constipation and nausea as a side effect.  5. large liver cyst. The patient has a large liver cyst and has some focal calcification. This liver cyst is not a simple cyst as per CT scan and will require further workup with MRI. Recommended to get this as an outpatient once her symptoms are controlled.  Advance goals of care discussion: Full code as per my discussion with patient  DVT Prophylaxis: subcutaneous Heparin Nutrition:  small frequent meals regular diet   Family Communication: family was present at bedside, opportunity was given to ask question and all questions were answered satisfactorily at the time of interview. Disposition:  Admitted as observation, telemetry unit.  Author: Berle Mull, MD [facility] Pager: [phone] 01/29/2015  If 7PM-7AM, please contact night-coverage [email or web address] Password [email or web address]

## 2015-01-30 NOTE — Progress Notes (Signed)
[facility] PROGRESS NOTE  Stacy Mcguire HMC:[id number] DOB: 02/04/1928 DOA: 01/29/2015 PCP: Elton Sin, FNP  Assessment/Plan: 81 y/o female with PMH of IBD, Ulcerative Colitis, CKD, Recent recurrent UTIs presented with abdominal pains, weakness, nausea, mild dirrhea. Found to have urinary retentions.   1. Abdominal pains thought due to urinary retention, distended bladder. Pain resolved after bladder drained 1L. She also had episodes of diarrhea, nausea. We will check c diff due to recent atx use, and h/o IBD  2. HTN. Elevated BP on admission. Resolved. Probably related to stress/pain. Cont monitor  3. IBD, Ulcerative Colitis. CT abd: 8.3 cm indeterminate low-attenuation lesion and smaller 2.5 cm lesion within the left hepatic lobe. Recommended MRI when clinically stable and able to follow breath holding instructions (usually best performed on an outpatient basis -patient reports taking mesalamine intermittently.  4. Urinary retention ? Related to promethazine. Discontinue phenergan. Voiding trial    Code Status: full Family Communication: d/w patient (indicate person spoken with, relationship, and if by phone, the number) Disposition Plan: home 24-48 hrs    Consultants:  none  Procedures:  none  Antibiotics:  none (indicate start date, and stop date if known)  HPI/Subjective: Alert, no pains   Objective: Filed Vitals:   01/30/15 0454  BP: 117/63  Pulse: 92  Temp: 98 F (36.7 C)  Resp: 18    Intake/Output Summary (Last 24 hours) at 01/30/15 1206 Last data filed at 01/30/15 0900  Gross per 24 hour  Intake 620.83 ml  Output   1650 ml  Net -1029.17 ml   Filed Weights   01/29/15 2318 01/30/15 0454  Weight: 50.2 kg (110 lb 10.7 oz) 51 kg (112 lb 7 oz)    Exam:   General:  No distress   Cardiovascular: s1,s2 rrr  Respiratory: CTA BL  Abdomen: soft, nt,nd   Musculoskeletal: no leg edema   Data Reviewed: Basic Metabolic Panel:  Recent  Labs Lab 01/29/15 1709 01/30/15 0513  NA 133* 131*  K 3.7 3.3*  CL 102 103  CO2 18* 21*  GLUCOSE 104* 98  BUN 18 12  CREATININE 1.01* 0.99  CALCIUM 9.5 8.7*   Liver Function Tests:  Recent Labs Lab 01/29/15 1709 01/30/15 0513  AST 20 18  ALT 17 14  ALKPHOS 59 51  BILITOT 0.5 1.1  PROT 7.3 6.2*  ALBUMIN 4.2 3.5    Recent Labs Lab 01/29/15 1709  LIPASE 58*   No results for input(s): AMMONIA in the last 168 hours. CBC:  Recent Labs Lab 01/29/15 1709 01/30/15 0513  WBC 10.3 9.7  HGB 12.3 11.5*  HCT 37.1 34.5*  MCV 83.0 83.1  PLT 402* 309   Cardiac Enzymes: No results for input(s): CKTOTAL, CKMB, CKMBINDEX, TROPONINI in the last 168 hours. BNP (last 3 results) No results for input(s): BNP in the last 8760 hours.  ProBNP (last 3 results) No results for input(s): PROBNP in the last 8760 hours.  CBG: No results for input(s): GLUCAP in the last 168 hours.  No results found for this or any previous visit (from the past 240 hour(s)).   Studies: Dg Chest 2 View  01/29/2015   CLINICAL DATA:  81 year old female with lower chest pressure and shortness of breath with nonproductive cough past 2 days.  EXAM: CHEST  2 VIEW  COMPARISON:  Chest x-ray 11/03/2014.  FINDINGS: Lung volumes are normal. No consolidative airspace disease. No pleural effusions. No evidence of pulmonary edema. Heart size is normal. Upper mediastinal contours are  within normal limits. Atherosclerosis in the thoracic aorta. Large hiatal hernia.  IMPRESSION: 1. No radiographic evidence of acute cardiopulmonary disease. 2. Large hiatal hernia. 3. Atherosclerosis.   Electronically Signed   By: Vinnie Langton M.D.   On: 01/29/2015 19:35   Ct Abdomen Pelvis W Contrast  01/29/2015   CLINICAL DATA:  Recently diagnosed with diverticulitis. Upper abdominal and chest pain like a belt is tightening. Difficulty swallowing. Unable to keep fluids down. 15 pound weight loss. Weakness.  EXAM: CT ABDOMEN AND PELVIS  WITH CONTRAST  TECHNIQUE: Multidetector CT imaging of the abdomen and pelvis was performed using the standard protocol following bolus administration of intravenous contrast.  CONTRAST:  77mL OMNIPAQUE IOHEXOL 300 MG/ML  SOLN  COMPARISON:  CT of the pelvis 11/19/2006  FINDINGS: Lower chest: Large hiatal hernia is present. There is scarring or atelectasis in the anterior aspects of the lungs bilaterally. The heart is mildly enlarged. No pericardial effusion.  Upper abdomen: Within the left hepatic lobe there is a low-attenuation lesion which measures 7.3 x 8.3 cm. Although low-attenuation this does not represent a simple cyst by Hounsfield unit measurements. A thin calcifications identified along the superior aspect of the lesion. A small adjacent lesion is identified within the lateral segment of left hepatic lobe measuring 2.5 x 1.1 cm and not further characterized.  No focal abnormality identified within the spleen, pancreas, or adrenal glands. Small left renal cyst is identified. The right kidney is somewhat ptotic in position possibly related to patient's scoliosis no hydronephrosis. Gallbladder is present.  Gastrointestinal tract: Large hiatal hernia. Small bowel loops are normal in caliber. There is a right inguinal hernia which does contain nondilated loops of small bowel and appears uncomplicated. The appendix is not well seen. Colonic diverticula are present no acute diverticulitis identified.  Pelvis: Urinary bladder is significantly distended. Status post hysterectomy. No adnexal mass or free pelvic fluid.  Retroperitoneum: There is atherosclerotic calcification of the abdominal aorta. Aorta is tortuous but not aneurysmal. No retroperitoneal or mesenteric adenopathy.  Abdominal wall: Right inguinal hernia containing nondilated loops of small bowel.  Osseous structures: There are significant degenerative changes in the thoracolumbar spine, associated with scoliosis. No suspicious lytic or blastic lesions  are identified.  IMPRESSION: 1. 8.3 cm indeterminate low-attenuation lesion and smaller 2.5 cm lesion within the left hepatic lobe. Further evaluation with MRI is recommended. MRI should be performed when the patient is clinically stable and able to follow breath holding instructions (usually best performed on an outpatient basis). 2. Large hiatal hernia. 3. Mild cardiomegaly. 4. Colonic diverticulosis. 5. Status post hysterectomy. 6. Right inguinal hernia containing small bowel loops.   Electronically Signed   By: Nolon Nations M.D.   On: 01/29/2015 20:32    Scheduled Meds: . amitriptyline  10 mg Oral QHS  . feeding supplement (ENSURE ENLIVE)  237 mL Oral BID BM  . FLORA-Q  1 capsule Oral Daily  . heparin  5,000 Units Subcutaneous 3 times per day  . mesalamine  1.2 g Oral QID  . pantoprazole  40 mg Oral BID AC  . phenazopyridine  100 mg Oral TID WC  . promethazine  25 mg Oral QHS  . psyllium  1 packet Oral Daily  . sertraline  50 mg Oral Daily  . sodium chloride  3 mL Intravenous Q12H  . tamsulosin  0.4 mg Oral QPC supper   Continuous Infusions: . sodium chloride 50 mL/hr at 01/29/15 2323    Principal Problem:   Accelerated hypertension  Active Problems:   GERD   Ulcerative colitis   Constipation   Intractable nausea and vomiting   Hiatal hernia   Urinary retention   Liver cyst   Weakness generalized    Time spent: >35 minutes     Kinnie Feil  [facility] Pager [phone]. If 7PM-7AM, please contact night-coverage at [email or web address], password [facility] 01/30/2015, 12:06 PM

## 2015-01-31 DIAGNOSIS — R339 Retention of urine, unspecified: Secondary | ICD-10-CM | POA: Diagnosis not present

## 2015-01-31 DIAGNOSIS — R1110 Vomiting, unspecified: Secondary | ICD-10-CM | POA: Diagnosis not present

## 2015-01-31 DIAGNOSIS — I10 Essential (primary) hypertension: Secondary | ICD-10-CM | POA: Diagnosis not present

## 2015-01-31 MED ORDER — TRAMADOL HCL 50 MG PO TABS: 50.0000 mg | ORAL_TABLET | ORAL | Status: DC | PRN

## 2015-01-31 MED ORDER — ACETAMINOPHEN 325 MG PO TABS: 650.0000 mg | ORAL_TABLET | Freq: Four times a day (QID) | ORAL | Status: DC | PRN

## 2015-01-31 MED ORDER — ONDANSETRON HCL 4 MG PO TABS: 4.0000 mg | ORAL_TABLET | Freq: Every day | ORAL | Status: DC | PRN

## 2015-01-31 NOTE — Discharge Summary (Signed)
Physician Discharge Summary  Stacy Mcguire:[id number] DOB: February 27, 1928 DOA: 01/29/2015  PCP: Elton Sin, FNP  Admit date: 01/29/2015 Discharge date: 01/31/2015  Time spent: >35 minutes  Recommendations for Outpatient Follow-up:  F/u with GI dr. Amedeo Plenty in 1 week as needed F/u with PCP in 1-2 weeks   Discharge Diagnoses:  Principal Problem:   Accelerated hypertension Active Problems:   GERD   Ulcerative colitis   Constipation   Intractable nausea and vomiting   Hiatal hernia   Urinary retention   Liver cyst   Weakness generalized   Discharge Condition: stable   Diet recommendation: regular   Filed Weights   01/29/15 2318 01/30/15 0454  Weight: 50.2 kg (110 lb 10.7 oz) 51 kg (112 lb 7 oz)    History of present illness:  81 y/o female with PMH of IBD, Ulcerative Colitis, recent Recent recurrent UTIs presented with abdominal pains, weakness, nausea, mild dirrhea. Found to have urinary retentions.  -"The patient is presenting with complains of abdominal pain. The pain she has is located in the suprapubic area and she mentions that the pain is radiating straight up to her epigastrium. The pain feels like sharp crampy pain and has been worse during the morning but present throughout the day. This pain has been present since last 2 months and has been progressively worsening today. This is also associated with chronic nausea. Patient has not been eating and drinking and has significant weight loss. Patient recently had an sigmoidoscopy as an outpatient and was found to be having active colitis and was placed on Lialda-mesalamine by GI Dr. Amedeo Plenty. Patient also has been found to have recurrent UTI with 3 antibiotics course last month."  Hospital Course:  1. Abdominal pains thought due to urinary retention, distended bladder. Pain resolved after bladder drained 1L. She has had also had episode of diarrhea-resolved. No new episodes of diarrhea. No s/s of acute IBD. Abdominal exam  is benign. ESR-11. CRP-0.5. Patient reports chronic nausea on antiemetics at home. She is stable for discharge, recommended to f/u with GI as outpatient in 1 week as needed  2. HTN. Elevated BP on admission. Resolved. Probably related to stress/pain. 3. H/o IBD, Ulcerative Colitis. CT abd: 8.3 cm indeterminate low-attenuation lesion and smaller 2.5 cm lesion within the left hepatic lobe. Recommended MRI when clinically stable and able to follow breath holding instructions (usually best performed on an outpatient basis).  -Patient reports taking mesalamine intermittently, recently started per GI. Recommended to f/u with GI as outpatient. No s/s of acute IBD exacerbation at this time.   4. Urinary retention ? Related to promethazine. Discontinue phenergan. Passed voiding trial. UA: no s/s of infection     Procedures:  CT scan  (i.e. Studies not automatically included, echos, thoracentesis, etc; not x-rays)  Consultations:  None   Discharge Exam: Filed Vitals:   01/30/15 2245  BP: 139/69  Pulse: 86  Temp: 97.9 F (36.6 C)  Resp: 18    General: alert. No distress  Cardiovascular: s1,s2 rrr Respiratory: CTA BL  Discharge Instructions  Discharge Instructions    Diet - low sodium heart healthy    Complete by:  As directed      Discharge instructions    Complete by:  As directed   Please  Follow up with primary care doctor in 1-2 weeks as needed Please follow up with GI in 1 week     Increase activity slowly    Complete by:  As directed  Medication List    STOP taking these medications        naproxen 500 MG tablet  Commonly known as:  NAPROSYN     promethazine 25 MG tablet  Commonly known as:  PHENERGAN      TAKE these medications        acetaminophen 325 MG tablet  Commonly known as:  TYLENOL  Take 2 tablets (650 mg total) by mouth every 6 (six) hours as needed for mild pain (or Fever >/= 101).     ALIGN PO  Take 1 capsule by mouth daily.      diazepam 2 MG tablet  Commonly known as:  VALIUM  Take 2 mg by mouth at bedtime as needed for sedation.     diphenhydrAMINE 25 MG tablet  Commonly known as:  SOMINEX  Take 25 mg by mouth at bedtime as needed for sleep.     docusate sodium 100 MG capsule  Commonly known as:  COLACE  Take 100-200 mg by mouth daily as needed for mild constipation.     ENSURE  Take 1 Can by mouth daily. Once or twice a week     hydrocortisone cream 0.5 %  Apply 1 application topically 2 (two) times daily as needed for itching.     LIALDA 1.2 G EC tablet  Generic drug:  mesalamine  Take 1.2 g by mouth 4 (four) times daily.     omeprazole 20 MG capsule  Commonly known as:  PRILOSEC  Take 20 mg by mouth daily.     ondansetron 4 MG tablet  Commonly known as:  ZOFRAN  Take 1 tablet (4 mg total) by mouth daily as needed for nausea.     sertraline 50 MG tablet  Commonly known as:  ZOLOFT  Take 1 tablet by mouth daily.     traMADol 50 MG tablet  Commonly known as:  ULTRAM  Take 1 tablet (50 mg total) by mouth every 4 (four) hours as needed. for pain     Vitamin D (Ergocalciferol) 50000 UNITS Caps capsule  Commonly known as:  DRISDOL  Take 50,000 Units by mouth daily.       Allergies  Allergen Reactions  . Neosporin Af [Miconazole] Itching       Follow-up Information    Follow up with Elton Sin, FNP In 1 week.   Specialty:  Nurse Practitioner   Contact information:   [place] [address] [phone]       Follow up with HAYES,JOHN C, MD In 1 week.   Specialty:  Gastroenterology   Contact information:   [address] [place] [address] [phone]        The results of significant diagnostics from this hospitalization (including imaging, microbiology, ancillary and laboratory) are listed below for reference.    Significant Diagnostic Studies: Dg Chest 2 View  01/29/2015   CLINICAL DATA:  81 year old female with lower chest  pressure and shortness of breath with nonproductive cough past 2 days.  EXAM: CHEST  2 VIEW  COMPARISON:  Chest x-ray 11/03/2014.  FINDINGS: Lung volumes are normal. No consolidative airspace disease. No pleural effusions. No evidence of pulmonary edema. Heart size is normal. Upper mediastinal contours are within normal limits. Atherosclerosis in the thoracic aorta. Large hiatal hernia.  IMPRESSION: 1. No radiographic evidence of acute cardiopulmonary disease. 2. Large hiatal hernia. 3. Atherosclerosis.   Electronically Signed   By: Mauri Brooklyn.D.  On: 01/29/2015 19:35   Ct Abdomen Pelvis W Contrast  01/29/2015   CLINICAL DATA:  Recently diagnosed with diverticulitis. Upper abdominal and chest pain like a belt is tightening. Difficulty swallowing. Unable to keep fluids down. 15 pound weight loss. Weakness.  EXAM: CT ABDOMEN AND PELVIS WITH CONTRAST  TECHNIQUE: Multidetector CT imaging of the abdomen and pelvis was performed using the standard protocol following bolus administration of intravenous contrast.  CONTRAST:  33m OMNIPAQUE IOHEXOL 300 MG/ML  SOLN  COMPARISON:  CT of the pelvis 11/19/2006  FINDINGS: Lower chest: Large hiatal hernia is present. There is scarring or atelectasis in the anterior aspects of the lungs bilaterally. The heart is mildly enlarged. No pericardial effusion.  Upper abdomen: Within the left hepatic lobe there is a low-attenuation lesion which measures 7.3 x 8.3 cm. Although low-attenuation this does not represent a simple cyst by Hounsfield unit measurements. A thin calcifications identified along the superior aspect of the lesion. A small adjacent lesion is identified within the lateral segment of left hepatic lobe measuring 2.5 x 1.1 cm and not further characterized.  No focal abnormality identified within the spleen, pancreas, or adrenal glands. Small left renal cyst is identified. The right kidney is somewhat ptotic in position possibly related to patient's scoliosis no  hydronephrosis. Gallbladder is present.  Gastrointestinal tract: Large hiatal hernia. Small bowel loops are normal in caliber. There is a right inguinal hernia which does contain nondilated loops of small bowel and appears uncomplicated. The appendix is not well seen. Colonic diverticula are present no acute diverticulitis identified.  Pelvis: Urinary bladder is significantly distended. Status post hysterectomy. No adnexal mass or free pelvic fluid.  Retroperitoneum: There is atherosclerotic calcification of the abdominal aorta. Aorta is tortuous but not aneurysmal. No retroperitoneal or mesenteric adenopathy.  Abdominal wall: Right inguinal hernia containing nondilated loops of small bowel.  Osseous structures: There are significant degenerative changes in the thoracolumbar spine, associated with scoliosis. No suspicious lytic or blastic lesions are identified.  IMPRESSION: 1. 8.3 cm indeterminate low-attenuation lesion and smaller 2.5 cm lesion within the left hepatic lobe. Further evaluation with MRI is recommended. MRI should be performed when the patient is clinically stable and able to follow breath holding instructions (usually best performed on an outpatient basis). 2. Large hiatal hernia. 3. Mild cardiomegaly. 4. Colonic diverticulosis. 5. Status post hysterectomy. 6. Right inguinal hernia containing small bowel loops.   Electronically Signed   By: ENolon NationsM.D.   On: 01/29/2015 20:32    Microbiology: No results found for this or any previous visit (from the past 240 hour(s)).   Labs: Basic Metabolic Panel:  Recent Labs Lab 01/29/15 1709 01/30/15 0513  NA 133* 131*  K 3.7 3.3*  CL 102 103  CO2 18* 21*  GLUCOSE 104* 98  BUN 18 12  CREATININE 1.01* 0.99  CALCIUM 9.5 8.7*   Liver Function Tests:  Recent Labs Lab 01/29/15 1709 01/30/15 0513  AST 20 18  ALT 17 14  ALKPHOS 59 51  BILITOT 0.5 1.1  PROT 7.3 6.2*  ALBUMIN 4.2 3.5    Recent Labs Lab 01/29/15 1709  LIPASE  58*   No results for input(s): AMMONIA in the last 168 hours. CBC:  Recent Labs Lab 01/29/15 1709 01/30/15 0513  WBC 10.3 9.7  HGB 12.3 11.5*  HCT 37.1 34.5*  MCV 83.0 83.1  PLT 402* 309   Cardiac Enzymes: No results for input(s): CKTOTAL, CKMB, CKMBINDEX, TROPONINI in the last 168 hours. BNP: BNP (  last 3 results) No results for input(s): BNP in the last 8760 hours.  ProBNP (last 3 results) No results for input(s): PROBNP in the last 8760 hours.  CBG: No results for input(s): GLUCAP in the last 168 hours.     SignedKinnie Feil  [facility] 01/31/2015, 11:07 AM

## 2015-02-18 ENCOUNTER — Other Ambulatory Visit: Payer: Self-pay | Admitting: Gastroenterology

## 2015-02-18 DIAGNOSIS — K769 Liver disease, unspecified: Secondary | ICD-10-CM

## 2015-02-22 ENCOUNTER — Inpatient Hospital Stay (HOSPITAL_COMMUNITY)
Admission: EM | Admit: 2015-02-22 | Discharge: 2015-03-06 | DRG: 871 | Disposition: A | Discharge status: Skilled Nursing Facility | Payer: Medicare PPO | Attending: Internal Medicine | Admitting: Internal Medicine

## 2015-02-22 ENCOUNTER — Emergency Department (HOSPITAL_COMMUNITY): Payer: Medicare PPO

## 2015-02-22 ENCOUNTER — Encounter (HOSPITAL_COMMUNITY): Payer: Self-pay | Admitting: Emergency Medicine

## 2015-02-22 ENCOUNTER — Inpatient Hospital Stay (HOSPITAL_COMMUNITY): Payer: Medicare PPO

## 2015-02-22 DIAGNOSIS — A0472 Enterocolitis due to Clostridium difficile, not specified as recurrent: Secondary | ICD-10-CM | POA: Diagnosis present

## 2015-02-22 DIAGNOSIS — Z823 Family history of stroke: Secondary | ICD-10-CM | POA: Diagnosis not present

## 2015-02-22 DIAGNOSIS — Z803 Family history of malignant neoplasm of breast: Secondary | ICD-10-CM | POA: Diagnosis not present

## 2015-02-22 DIAGNOSIS — R079 Chest pain, unspecified: Secondary | ICD-10-CM

## 2015-02-22 DIAGNOSIS — K219 Gastro-esophageal reflux disease without esophagitis: Secondary | ICD-10-CM | POA: Diagnosis present

## 2015-02-22 DIAGNOSIS — E872 Acidosis, unspecified: Secondary | ICD-10-CM

## 2015-02-22 DIAGNOSIS — E876 Hypokalemia: Secondary | ICD-10-CM

## 2015-02-22 DIAGNOSIS — D75839 Thrombocytosis, unspecified: Secondary | ICD-10-CM | POA: Diagnosis present

## 2015-02-22 DIAGNOSIS — A09 Infectious gastroenteritis and colitis, unspecified: Secondary | ICD-10-CM | POA: Diagnosis not present

## 2015-02-22 DIAGNOSIS — I509 Heart failure, unspecified: Secondary | ICD-10-CM | POA: Diagnosis present

## 2015-02-22 DIAGNOSIS — R531 Weakness: Secondary | ICD-10-CM | POA: Diagnosis not present

## 2015-02-22 DIAGNOSIS — Z87440 Personal history of urinary (tract) infections: Secondary | ICD-10-CM | POA: Diagnosis not present

## 2015-02-22 DIAGNOSIS — Z681 Body mass index (BMI) 19 or less, adult: Secondary | ICD-10-CM

## 2015-02-22 DIAGNOSIS — N178 Other acute kidney failure: Secondary | ICD-10-CM

## 2015-02-22 DIAGNOSIS — Z79899 Other long term (current) drug therapy: Secondary | ICD-10-CM | POA: Diagnosis not present

## 2015-02-22 DIAGNOSIS — Z90710 Acquired absence of both cervix and uterus: Secondary | ICD-10-CM

## 2015-02-22 DIAGNOSIS — N390 Urinary tract infection, site not specified: Secondary | ICD-10-CM | POA: Diagnosis present

## 2015-02-22 DIAGNOSIS — K5792 Diverticulitis of intestine, part unspecified, without perforation or abscess without bleeding: Secondary | ICD-10-CM | POA: Diagnosis present

## 2015-02-22 DIAGNOSIS — A047 Enterocolitis due to Clostridium difficile: Secondary | ICD-10-CM | POA: Diagnosis present

## 2015-02-22 DIAGNOSIS — N183 Chronic kidney disease, stage 3 (moderate): Secondary | ICD-10-CM | POA: Diagnosis present

## 2015-02-22 DIAGNOSIS — R197 Diarrhea, unspecified: Secondary | ICD-10-CM | POA: Diagnosis not present

## 2015-02-22 DIAGNOSIS — E861 Hypovolemia: Secondary | ICD-10-CM | POA: Diagnosis present

## 2015-02-22 DIAGNOSIS — Z79891 Long term (current) use of opiate analgesic: Secondary | ICD-10-CM | POA: Diagnosis not present

## 2015-02-22 DIAGNOSIS — Z888 Allergy status to other drugs, medicaments and biological substances status: Secondary | ICD-10-CM | POA: Diagnosis not present

## 2015-02-22 DIAGNOSIS — E43 Unspecified severe protein-calorie malnutrition: Secondary | ICD-10-CM | POA: Insufficient documentation

## 2015-02-22 DIAGNOSIS — A419 Sepsis, unspecified organism: Secondary | ICD-10-CM | POA: Diagnosis present

## 2015-02-22 DIAGNOSIS — A4151 Sepsis due to Escherichia coli [E. coli]: Principal | ICD-10-CM | POA: Diagnosis present

## 2015-02-22 DIAGNOSIS — Z833 Family history of diabetes mellitus: Secondary | ICD-10-CM | POA: Diagnosis not present

## 2015-02-22 DIAGNOSIS — K529 Noninfective gastroenteritis and colitis, unspecified: Secondary | ICD-10-CM | POA: Diagnosis present

## 2015-02-22 DIAGNOSIS — K5190 Ulcerative colitis, unspecified, without complications: Secondary | ICD-10-CM | POA: Diagnosis present

## 2015-02-22 DIAGNOSIS — R1110 Vomiting, unspecified: Secondary | ICD-10-CM | POA: Diagnosis not present

## 2015-02-22 DIAGNOSIS — N179 Acute kidney failure, unspecified: Secondary | ICD-10-CM | POA: Diagnosis present

## 2015-02-22 DIAGNOSIS — M1990 Unspecified osteoarthritis, unspecified site: Secondary | ICD-10-CM | POA: Diagnosis present

## 2015-02-22 DIAGNOSIS — R1084 Generalized abdominal pain: Secondary | ICD-10-CM | POA: Insufficient documentation

## 2015-02-22 DIAGNOSIS — E871 Hypo-osmolality and hyponatremia: Secondary | ICD-10-CM | POA: Diagnosis present

## 2015-02-22 DIAGNOSIS — D473 Essential (hemorrhagic) thrombocythemia: Secondary | ICD-10-CM

## 2015-02-22 DIAGNOSIS — E860 Dehydration: Secondary | ICD-10-CM | POA: Diagnosis present

## 2015-02-22 DIAGNOSIS — E46 Unspecified protein-calorie malnutrition: Secondary | ICD-10-CM | POA: Diagnosis not present

## 2015-02-22 LAB — CBC WITH DIFFERENTIAL/PLATELET
Basophils Absolute: 0 10*3/uL (ref 0.0–0.1)
Basophils Relative: 0 %
EOS PCT: 0 %
Eosinophils Absolute: 0 10*3/uL (ref 0.0–0.7)
HEMATOCRIT: 33.6 % — AB (ref 36.0–46.0)
HEMOGLOBIN: 11.4 g/dL — AB (ref 12.0–15.0)
Lymphocytes Relative: 3 %
Lymphs Abs: 1.1 10*3/uL (ref 0.7–4.0)
MCH: 27.5 pg (ref 26.0–34.0)
MCHC: 33.9 g/dL (ref 30.0–36.0)
MCV: 81.2 fL (ref 78.0–100.0)
MONOS PCT: 8 %
Monocytes Absolute: 2.8 10*3/uL — ABNORMAL HIGH (ref 0.1–1.0)
NEUTROS ABS: 31.4 10*3/uL — AB (ref 1.7–7.7)
Neutrophils Relative %: 89 %
Platelets: 449 10*3/uL — ABNORMAL HIGH (ref 150–400)
RBC: 4.14 MIL/uL (ref 3.87–5.11)
RDW: 14.6 % (ref 11.5–15.5)
WBC: 35.3 10*3/uL — ABNORMAL HIGH (ref 4.0–10.5)

## 2015-02-22 LAB — COMPREHENSIVE METABOLIC PANEL
ALBUMIN: 2.8 g/dL — AB (ref 3.5–5.0)
ALT: 37 U/L (ref 14–54)
AST: 33 U/L (ref 15–41)
Alkaline Phosphatase: 83 U/L (ref 38–126)
Anion gap: 10 (ref 5–15)
BILIRUBIN TOTAL: 0.7 mg/dL (ref 0.3–1.2)
BUN: 28 mg/dL — ABNORMAL HIGH (ref 6–20)
CALCIUM: 9.1 mg/dL (ref 8.9–10.3)
CO2: 23 mmol/L (ref 22–32)
Chloride: 97 mmol/L — ABNORMAL LOW (ref 101–111)
Creatinine, Ser: 1.19 mg/dL — ABNORMAL HIGH (ref 0.44–1.00)
GFR calc non Af Amer: 41 mL/min — ABNORMAL LOW (ref >60)
GFR, EST AFRICAN AMERICAN: 47 mL/min — AB (ref >60)
GLUCOSE: 160 mg/dL — AB (ref 65–99)
Potassium: 3.6 mmol/L (ref 3.5–5.1)
Sodium: 130 mmol/L — ABNORMAL LOW (ref 135–145)
TOTAL PROTEIN: 6.3 g/dL — AB (ref 6.5–8.1)

## 2015-02-22 LAB — URINALYSIS, ROUTINE W REFLEX MICROSCOPIC
Bilirubin Urine: NEGATIVE
GLUCOSE, UA: NEGATIVE mg/dL
Ketones, ur: NEGATIVE mg/dL
Leukocytes, UA: NEGATIVE
Nitrite: NEGATIVE
Protein, ur: NEGATIVE mg/dL
UROBILINOGEN UA: 0.2 mg/dL (ref 0.0–1.0)
pH: 6.5 (ref 5.0–8.0)

## 2015-02-22 LAB — I-STAT CG4 LACTIC ACID, ED
LACTIC ACID, VENOUS: 1.48 mmol/L (ref 0.5–2.0)
Lactic Acid, Venous: 1.17 mmol/L (ref 0.5–2.0)

## 2015-02-22 LAB — CBC
HCT: 32.5 % — ABNORMAL LOW (ref 36.0–46.0)
HEMOGLOBIN: 10.9 g/dL — AB (ref 12.0–15.0)
MCH: 27.8 pg (ref 26.0–34.0)
MCHC: 33.5 g/dL (ref 30.0–36.0)
MCV: 82.9 fL (ref 78.0–100.0)
Platelets: 429 10*3/uL — ABNORMAL HIGH (ref 150–400)
RBC: 3.92 MIL/uL (ref 3.87–5.11)
RDW: 15 % (ref 11.5–15.5)
WBC: 29.8 10*3/uL — ABNORMAL HIGH (ref 4.0–10.5)

## 2015-02-22 LAB — APTT: aPTT: 29 seconds (ref 24–37)

## 2015-02-22 LAB — LACTIC ACID, PLASMA
Lactic Acid, Venous: 1.2 mmol/L (ref 0.5–2.0)
Lactic Acid, Venous: 1.2 mmol/L (ref 0.5–2.0)

## 2015-02-22 LAB — CREATININE, SERUM
CREATININE: 1.01 mg/dL — AB (ref 0.44–1.00)
GFR calc non Af Amer: 50 mL/min — ABNORMAL LOW (ref >60)
GFR, EST AFRICAN AMERICAN: 58 mL/min — AB (ref >60)

## 2015-02-22 LAB — PROTIME-INR
INR: 1.17 (ref 0.00–1.49)
Prothrombin Time: 15.1 seconds (ref 11.6–15.2)

## 2015-02-22 LAB — URINE MICROSCOPIC-ADD ON

## 2015-02-22 LAB — C DIFFICILE QUICK SCREEN W PCR REFLEX
C DIFFICILE (CDIFF) INTERP: POSITIVE
C Diff antigen: POSITIVE — AB
C Diff toxin: POSITIVE — AB

## 2015-02-22 LAB — LIPASE, BLOOD: Lipase: <10 U/L — ABNORMAL LOW (ref 22–51)

## 2015-02-22 LAB — PROCALCITONIN: Procalcitonin: 0.4 ng/mL

## 2015-02-22 MED ORDER — ONDANSETRON HCL 4 MG PO TABS: 4.0000 mg | ORAL_TABLET | Freq: Every day | ORAL | Status: DC | PRN

## 2015-02-22 MED ORDER — ENOXAPARIN SODIUM 30 MG/0.3ML ~~LOC~~ SOLN
30.0000 mg | SUBCUTANEOUS | Status: DC
Start: 2015-02-22 — End: 2015-02-23
  Administered 2015-02-22: 30 mg via SUBCUTANEOUS
  Filled 2015-02-22 (×2): qty 0.3

## 2015-02-22 MED ORDER — ALIGN PO CAPS
1.0000 | ORAL_CAPSULE | Freq: Every day | ORAL | Status: DC
  Administered 2015-02-22 – 2015-03-01 (×8): 1 via ORAL
  Filled 2015-02-22 (×8): qty 1

## 2015-02-22 MED ORDER — ONDANSETRON HCL 4 MG/2ML IJ SOLN
4.0000 mg | Freq: Four times a day (QID) | INTRAMUSCULAR | Status: DC | PRN
  Administered 2015-02-26 (×2): 4 mg via INTRAVENOUS
  Filled 2015-02-22 (×2): qty 2

## 2015-02-22 MED ORDER — SODIUM CHLORIDE 0.9 % IV SOLN
INTRAVENOUS | Status: DC
  Administered 2015-02-22 – 2015-02-23 (×2): via INTRAVENOUS

## 2015-02-22 MED ORDER — IOHEXOL 300 MG/ML  SOLN
80.0000 mL | Freq: Once | INTRAMUSCULAR | Status: AC | PRN
  Administered 2015-02-22: 80 mL via INTRAVENOUS

## 2015-02-22 MED ORDER — SERTRALINE HCL 50 MG PO TABS
50.0000 mg | ORAL_TABLET | Freq: Every day | ORAL | Status: DC
  Administered 2015-02-22 – 2015-03-06 (×13): 50 mg via ORAL
  Filled 2015-02-22 (×13): qty 1

## 2015-02-22 MED ORDER — DIAZEPAM 2 MG PO TABS
2.0000 mg | ORAL_TABLET | Freq: Every evening | ORAL | Status: DC | PRN
  Administered 2015-02-23 – 2015-02-25 (×2): 2 mg via ORAL
  Filled 2015-02-22 (×2): qty 1

## 2015-02-22 MED ORDER — VANCOMYCIN 50 MG/ML ORAL SOLUTION
125.0000 mg | Freq: Four times a day (QID) | ORAL | Status: DC
  Administered 2015-02-22 – 2015-02-25 (×11): 125 mg via ORAL
  Filled 2015-02-22 (×15): qty 2.5

## 2015-02-22 MED ORDER — ONDANSETRON HCL 4 MG/2ML IJ SOLN
4.0000 mg | Freq: Once | INTRAMUSCULAR | Status: AC
  Administered 2015-02-22: 4 mg via INTRAVENOUS
  Filled 2015-02-22: qty 2

## 2015-02-22 MED ORDER — ONDANSETRON HCL 4 MG/2ML IJ SOLN
4.0000 mg | Freq: Four times a day (QID) | INTRAMUSCULAR | Status: DC | PRN
  Administered 2015-02-22: 4 mg via INTRAVENOUS
  Filled 2015-02-22: qty 2

## 2015-02-22 MED ORDER — DEXTROSE-NACL 5-0.45 % IV SOLN
INTRAVENOUS | Status: DC
  Administered 2015-02-22: 17:00:00 via INTRAVENOUS

## 2015-02-22 MED ORDER — IOHEXOL 300 MG/ML  SOLN
50.0000 mL | Freq: Once | INTRAMUSCULAR | Status: AC | PRN
  Administered 2015-02-22: 50 mL via ORAL

## 2015-02-22 MED ORDER — SODIUM CHLORIDE 0.9 % IV BOLUS (SEPSIS)
1000.0000 mL | Freq: Once | INTRAVENOUS | Status: AC
  Administered 2015-02-22: 1000 mL via INTRAVENOUS

## 2015-02-22 MED ORDER — SODIUM CHLORIDE 0.9 % IJ SOLN
3.0000 mL | Freq: Two times a day (BID) | INTRAMUSCULAR | Status: DC
  Administered 2015-02-22 – 2015-03-03 (×9): 3 mL via INTRAVENOUS

## 2015-02-22 NOTE — ED Provider Notes (Signed)
CSN: [id number]     Arrival date & time 02/22/15  2119 History   First MD Initiated Contact with Patient 02/22/15 [phone]     Chief Complaint  Patient presents with  . Diarrhea     (Consider location/radiation/quality/duration/timing/severity/associated sxs/prior Treatment) Patient is a 81 y.o. female presenting with diarrhea.  Diarrhea Quality:  Watery Severity:  Severe Onset quality:  Gradual Number of episodes:  Greater than 10 a day Duration:  4 weeks Timing:  Constant Progression:  Worsening Relieved by:  Nothing Worsened by:  Nothing tried Ineffective treatments:  None tried Associated symptoms: fever (subjective on and off) and vomiting   Associated symptoms: no abdominal pain, no chills and no headaches   Risk factors: recent antibiotic use (from uti in June)     Past Medical History  Diagnosis Date  . Arthritis   . Anemia, unspecified   . Depression   . GERD (gastroesophageal reflux disease)   . Diverticulosis 2010    Colonoscopy  . Internal hemorrhoids 2010    Colonoscopy  . Hiatal hernia 2010    EGD   . Ulcerative (chronic) proctitis 2005    Colonoscopy   . Osteoarthritis   . Migraine   . Kidney disease, chronic, stage III (moderate, EGFR 30-59 ml/min)   . Shingles   . Hot flashes   . UTI (urinary tract infection)    Past Surgical History  Procedure Laterality Date  . Abdominal hysterectomy    . Knee surgery    . Mastoidectomy    . Tonsillectomy    . Nose surgery     Family History  Problem Relation Age of Onset  . Breast cancer Mother   . Colon cancer Neg Hx   . Breast cancer Sister 8  . Stroke Mother 29  . Diabetes Son 59  . Prostate cancer Son    Social History  Substance Use Topics  . Smoking status: Never Smoker   . Smokeless tobacco: Never Used  . Alcohol Use: No   OB History    No data available     Review of Systems  Constitutional: Positive for fever (subjective on and off), appetite change and fatigue. Negative for chills.   HENT: Negative for sore throat.   Eyes: Negative for visual disturbance.  Respiratory: Negative for cough and shortness of breath.   Cardiovascular: Negative for chest pain.  Gastrointestinal: Positive for vomiting and diarrhea. Negative for abdominal pain.  Genitourinary: Negative for difficulty urinating.  Musculoskeletal: Negative for back pain and neck pain.  Skin: Negative for rash.  Neurological: Positive for syncope (one night after returning from bathroom) and weakness (weak all over). Negative for headaches.      Allergies  Neosporin af  Home Medications   Prior to Admission medications   Medication Sig Start Date End Date Taking? Authorizing Provider  acetaminophen (TYLENOL) 325 MG tablet Take 2 tablets (650 mg total) by mouth every 6 (six) hours as needed for mild pain (or Fever >/= 101). 01/31/15  Yes Kinnie Feil, MD  diazepam (VALIUM) 2 MG tablet Take 2 mg by mouth at bedtime as needed for sedation.    Yes Historical Provider, MD  ENSURE (ENSURE) Take 1 Can by mouth daily. Once or twice a week   Yes Historical Provider, MD  hydrocortisone cream 0.5 % Apply 1 application topically 2 (two) times daily as needed for itching.   Yes Historical Provider, MD  Loperamide HCl (ANTI-DIARRHEAL PO) Take 2 tablets by mouth daily as needed (diarrhea).  Yes Historical Provider, MD  omeprazole (PRILOSEC) 20 MG capsule Take 20 mg by mouth daily.   Yes Historical Provider, MD  ondansetron (ZOFRAN) 4 MG tablet Take 1 tablet (4 mg total) by mouth daily as needed for nausea. 01/31/15  Yes Kinnie Feil, MD  Probiotic Product (ALIGN PO) Take 1 capsule by mouth daily.   Yes Historical Provider, MD  psyllium (METAMUCIL) 58.6 % powder Take 1 packet by mouth 2 (two) times daily. Mix with 8oz of water   Yes Historical Provider, MD  sertraline (ZOLOFT) 50 MG tablet Take 50 mg by mouth daily.  01/14/15  Yes Historical Provider, MD  traMADol (ULTRAM) 50 MG tablet Take 1 tablet (50 mg total) by  mouth every 4 (four) hours as needed. for pain 01/31/15  Yes Kinnie Feil, MD  Vitamin D, Ergocalciferol, (DRISDOL) 50000 UNITS CAPS capsule Take 50,000 Units by mouth daily.   Yes Historical Provider, MD   BP 123/64 mmHg  Pulse 92  Temp(Src) 98.2 F (36.8 C) (Oral)  Resp 18  Ht '5\' 4"'  (1.626 m)  Wt 115 lb 8.3 oz (52.4 kg)  BMI 19.82 kg/m2  SpO2 100% Physical Exam  Constitutional: She is oriented to person, place, and time. She appears well-developed and well-nourished. No distress.  HENT:  Head: Normocephalic and atraumatic.  Eyes: Conjunctivae and EOM are normal.  Neck: Normal range of motion.  Cardiovascular: Normal rate, regular rhythm, normal heart sounds and intact distal pulses.  Exam reveals no gallop and no friction rub.   No murmur heard. Pulmonary/Chest: Effort normal and breath sounds normal. No respiratory distress. She has no wheezes. She has no rales.  Abdominal: Soft. She exhibits no distension. There is tenderness. There is guarding.  Musculoskeletal: She exhibits no edema or tenderness.  Neurological: She is alert and oriented to person, place, and time.  Skin: Skin is warm and dry. No rash noted. She is not diaphoretic. No erythema.  Nursing note and vitals reviewed.   ED Course  Procedures (including critical care time) Labs Review Labs Reviewed  C DIFFICILE QUICK SCREEN W PCR REFLEX - Abnormal; Notable for the following:    C Diff antigen POSITIVE (*)    C Diff toxin POSITIVE (*)    All other components within normal limits  CBC WITH DIFFERENTIAL/PLATELET - Abnormal; Notable for the following:    WBC 35.3 (*)    Hemoglobin 11.4 (*)    HCT 33.6 (*)    Platelets 449 (*)    Neutro Abs 31.4 (*)    Monocytes Absolute 2.8 (*)    All other components within normal limits  COMPREHENSIVE METABOLIC PANEL - Abnormal; Notable for the following:    Sodium 130 (*)    Chloride 97 (*)    Glucose, Bld 160 (*)    BUN 28 (*)    Creatinine, Ser 1.19 (*)    Total  Protein 6.3 (*)    Albumin 2.8 (*)    GFR calc non Af Amer 41 (*)    GFR calc Af Amer 47 (*)    All other components within normal limits  LIPASE, BLOOD - Abnormal; Notable for the following:    Lipase <10 (*)    All other components within normal limits  URINALYSIS, ROUTINE W REFLEX MICROSCOPIC (NOT AT [facility]) - Abnormal; Notable for the following:    Specific Gravity, Urine >1.046 (*)    Hgb urine dipstick TRACE (*)    All other components within normal limits  CBC -  Abnormal; Notable for the following:    WBC 29.8 (*)    Hemoglobin 10.9 (*)    HCT 32.5 (*)    Platelets 429 (*)    All other components within normal limits  CREATININE, SERUM - Abnormal; Notable for the following:    Creatinine, Ser 1.01 (*)    GFR calc non Af Amer 50 (*)    GFR calc Af Amer 58 (*)    All other components within normal limits  CULTURE, BLOOD (ROUTINE X 2)  CULTURE, BLOOD (ROUTINE X 2)  URINE MICROSCOPIC-ADD ON  LACTIC ACID, PLASMA  LACTIC ACID, PLASMA  PROCALCITONIN  PROTIME-INR  APTT  GI PATHOGEN PANEL BY PCR, STOOL  BASIC METABOLIC PANEL  CBC  I-STAT CG4 LACTIC ACID, ED  I-STAT CG4 LACTIC ACID, ED    Imaging Review Ct Abdomen Pelvis W Contrast  02/22/2015   CLINICAL DATA:  Diarrhea and abdominal pain for 3-4 months.  EXAM: CT ABDOMEN AND PELVIS WITH CONTRAST  TECHNIQUE: Multidetector CT imaging of the abdomen and pelvis was performed using the standard protocol following bolus administration of intravenous contrast.  CONTRAST:  42m OMNIPAQUE IOHEXOL 300 MG/ML SOLN, 5107mOMNIPAQUE IOHEXOL 300 MG/ML SOLN  COMPARISON:  01/29/2015  FINDINGS: Lower chest: No pleural or pericardial effusion identified. The lung bases are clear.  Hepatobiliary: No suspicious liver abnormality. Large cyst within left lobe is again noted measuring 8.2 cm on today's exam. Stable from previous exam. The gallbladder is normal. There is no biliary dilatation identified.  Pancreas: Unremarkable appearance of the pancreas.   Spleen: The spleen appears normal.  Adrenals/Urinary Tract: The adrenal glands are both normal. Small cysts are noted in both kidneys. The urinary bladder appears normal.  Stomach/Bowel: There is a large hiatal hernia noted. The small bowel loops have a normal course and caliber. No obstruction. From the distal transverse colon to the rectum there is marked wall thickening, mucosal enhancement and inflammation compatible with colitis. No pneumatosis. No perforation or abscess.  Vascular/Lymphatic: Calcified atherosclerotic disease involves the abdominal aorta. No aneurysm. No enlarged retroperitoneal or mesenteric adenopathy. No enlarged pelvic or inguinal lymph nodes.  Reproductive: Previous hysterectomy.  No adnexal mass.  Other: No free fluid or fluid collections.  Musculoskeletal: There is a scoliosis deformity involving the lumbar spine which is convex towards the right. Multi level degenerative disc disease identified.  IMPRESSION: 1. Examination is positive for acute colitis involving the distal transverse colon through the rectum. This may be inflammatory or infectious in etiology. Ischemic colitis not excluded. Negative for pneumatosis or perforation. 2. Aortic atherosclerosis. 3. Scoliosis and degenerative disc disease.   Electronically Signed   By: TaKerby Moors.D.   On: 02/22/2015 13:57   Dg Chest Port 1 View  02/22/2015   CLINICAL DATA:  Sepsis. Dehydration and diarrhea. Shortness of breath. Nonsmoker.  EXAM: PORTABLE CHEST 1 VIEW  COMPARISON:  01/29/2015  FINDINGS: Numerous leads and wires project over the chest. Midline trachea. Normal heart size and mediastinal contours for age. No pleural effusion or pneumothorax. Biapical pleural thickening. Moderate hiatal hernia. Clear lungs.  IMPRESSION: No acute cardiopulmonary disease.  Hiatal hernia.   Electronically Signed   By: KyAbigail Miyamoto.D.   On: 02/22/2015 18:22   I have personally reviewed and evaluated these images and lab results as part of  my medical decision-making.   EKG Interpretation None      MDM   Final diagnoses:  Sepsis  CDiff Colitis Diarrhea Dehydration Leukocytosis  [age]yoemale with  history of GERD, diverticulitis, recent admission with UTI on abx presents with concern of approximately 2 weeks of diarrhea greater than 10x per day.  Pt with generalized weakness, lives alone, given bolus of NS. CT done to eval for diverticulitis or toxic megacolon given left sided tenderness on exam. CT shows colitis. Lactic acid WNL.  CDiff positive and WBC 36000.  Given oral vancomycin for concern of cdiff.  Doubt other source of infection by clinical history.  Given dehydration, generalized weakness, will admit for observation, initiation of abx, IVF.     Gareth Morgan, MD 02/22/15 [phone]

## 2015-02-22 NOTE — ED Notes (Signed)
Pt states that she has been having diarrhea x 2 weeks with some vomiting.

## 2015-02-22 NOTE — ED Notes (Signed)
Set up bedside commode for pt.

## 2015-02-22 NOTE — ED Notes (Signed)
Per EMS comes from home  C/o diarrhea and abd pain x week and decreased PO intake over the weekend. .  Pt has been weak since Jun 2016.  Pt has PMH colitis, GERD, cyst on liver.  Pt vitals 118/60, HR 78, 20R.

## 2015-02-22 NOTE — ED Notes (Signed)
Patient transported to CT 

## 2015-02-22 NOTE — ED Notes (Signed)
Pt. Tried to use the restroom and was unable to go at this time. Urine cup at bedside.

## 2015-02-22 NOTE — ED Notes (Signed)
Bed: WA08 Expected date:  Expected time:  Means of arrival:  Comments: Stacy Mcguire A

## 2015-02-22 NOTE — H&P (Signed)
[facility] History and Physical  Stacy Mcguire OVZ:[id number] DOB: July 08, 1927 DOA: 02/22/2015  Referring physician: ED physician, Dr. Idolina Primer  PCP: Elton Sin, FNP   Chief Complaint: abd pain and watery diarrhea   HPI:  Pt is 81 yo female who presented to [facility] ED with main concern of several days duration of progressively worsening watery diarrhea over 10 episodes per day, associated with abd cramps, intermittent in nature and involving lower abd quadrants, occasionally radiating to the entire abdomen. This has been associated with poor oral intake and malaise. Pt reports she was recently treated with ABX for UTI. She denies similar events in the past. No fevers, chills, no urinary concerns.   In ED, pt noted to be hemodynamically stable, VS notable for HR up to 110, blood work notable for WBC 35 K,. High suspicion for C. Diff. TRH asked to admit for further evaluation.   Assessment and Plan:  Principal Problem:   Sepsis - Patient meets criteria for sepsis on admission with heart rate up to 111 and WBC 30 5K suspect the source of diverticulitis  - high suspicion for C. difficile - placed on oral vancomycin  - Follow up on lactic acid, blood cultures, procalcitonin level  - CBC in the morning  Active Problems:   Colitis - ? C. Diff, PCR test pending  - Oral vancomycin started and will continue until results are available     Diarrhea - Secondary to the above  - Stop PPI, avoid Imodium until C. difficile results back     Weakness generalized - Secondary to acute illness  - Once patient medically stable, please consult for PT evaluation     Acute renal failure - Prerenal in etiology, provide IV fluids and repeat BMP in the morning     Acute hyponatremia - Also prerenal in etiology, IV fluids as noted above     Thrombocytosis - Likely reactive, repeat CBC in the morning     DVT prophylaxis - Lovenox SQ   Radiological Exams on Admission: Ct Abdomen Pelvis W  Contrast  02/22/2015   CLINICAL DATA:  Diarrhea and abdominal pain for 3-4 months.  EXAM: CT ABDOMEN AND PELVIS WITH CONTRAST  TECHNIQUE: Multidetector CT imaging of the abdomen and pelvis was performed using the standard protocol following bolus administration of intravenous contrast.  CONTRAST:  45m OMNIPAQUE IOHEXOL 300 MG/ML SOLN, 561mOMNIPAQUE IOHEXOL 300 MG/ML SOLN  COMPARISON:  01/29/2015  FINDINGS: Lower chest: No pleural or pericardial effusion identified. The lung bases are clear.  Hepatobiliary: No suspicious liver abnormality. Large cyst within left lobe is again noted measuring 8.2 cm on today's exam. Stable from previous exam. The gallbladder is normal. There is no biliary dilatation identified.  Pancreas: Unremarkable appearance of the pancreas.  Spleen: The spleen appears normal.  Adrenals/Urinary Tract: The adrenal glands are both normal. Small cysts are noted in both kidneys. The urinary bladder appears normal.  Stomach/Bowel: There is a large hiatal hernia noted. The small bowel loops have a normal course and caliber. No obstruction. From the distal transverse colon to the rectum there is marked wall thickening, mucosal enhancement and inflammation compatible with colitis. No pneumatosis. No perforation or abscess.  Vascular/Lymphatic: Calcified atherosclerotic disease involves the abdominal aorta. No aneurysm. No enlarged retroperitoneal or mesenteric adenopathy. No enlarged pelvic or inguinal lymph nodes.  Reproductive: Previous hysterectomy.  No adnexal mass.  Other: No free fluid or fluid collections.  Musculoskeletal: There is a scoliosis deformity involving the lumbar spine which  is convex towards the right. Multi level degenerative disc disease identified.  IMPRESSION: 1. Examination is positive for acute colitis involving the distal transverse colon through the rectum. This may be inflammatory or infectious in etiology. Ischemic colitis not excluded. Negative for pneumatosis or  perforation. 2. Aortic atherosclerosis. 3. Scoliosis and degenerative disc disease.   Electronically Signed   By: Kerby Moors M.D.   On: 02/22/2015 13:57    Code Status: Full Family Communication: Pt at bedside Disposition Plan: Admit for further evaluation    [facility] [phone]   Review of Systems:  Constitutional: Negative for fever, chills. Negative for diaphoresis.  HENT: Negative for hearing loss, ear pain, nosebleeds, congestion, sore throat, neck pain, tinnitus and ear discharge.   Eyes: Negative for blurred vision, double vision, photophobia, pain, discharge and redness.  Respiratory: Negative for cough, hemoptysis, wheezing and stridor.   Cardiovascular: Negative for claudication and leg swelling.  Gastrointestinal: Per HPI Genitourinary: Negative for dysuria, urgency, frequency, hematuria and flank pain.  Musculoskeletal: Negative for myalgias, back pain, joint pain and falls.  Skin: Negative for itching and rash.  Neurological: Negative for dizziness Endo/Heme/Allergies: Negative for environmental allergies and polydipsia. Does not bruise/bleed easily.  Psychiatric/Behavioral: Negative for suicidal ideas. The patient is not nervous/anxious.      Past Medical History  Diagnosis Date  . Arthritis   . Anemia, unspecified   . Depression   . GERD (gastroesophageal reflux disease)   . Diverticulosis 2010    Colonoscopy  . Internal hemorrhoids 2010    Colonoscopy  . Hiatal hernia 2010    EGD   . Ulcerative (chronic) proctitis 2005    Colonoscopy   . Osteoarthritis   . Migraine   . Kidney disease, chronic, stage III (moderate, EGFR 30-59 ml/min)   . Shingles   . Hot flashes   . UTI (urinary tract infection)     Past Surgical History  Procedure Laterality Date  . Abdominal hysterectomy    . Knee surgery    . Mastoidectomy    . Tonsillectomy    . Nose surgery      Social History:  reports that she has never smoked. She has never used smokeless  tobacco. She reports that she does not drink alcohol or use illicit drugs.  Allergies  Allergen Reactions  . Neosporin Af [Miconazole] Itching    Family History  Problem Relation Age of Onset  . Breast cancer Mother   . Colon cancer Neg Hx   . Breast cancer Sister 57  . Stroke Mother 38  . Diabetes Son 67  . Prostate cancer Son     Prior to Admission medications   Medication Sig Start Date End Date Taking? Authorizing Greidy Sherard  acetaminophen (TYLENOL) 325 MG tablet Take 2 tablets (650 mg total) by mouth every 6 (six) hours as needed for mild pain (or Fever >/= 101). 01/31/15  Yes Kinnie Feil, MD  diazepam (VALIUM) 2 MG tablet Take 2 mg by mouth at bedtime as needed for sedation.    Yes Historical Sundeep Destin, MD  ENSURE (ENSURE) Take 1 Can by mouth daily. Once or twice a week   Yes Historical Briggette Najarian, MD  hydrocortisone cream 0.5 % Apply 1 application topically 2 (two) times daily as needed for itching.   Yes Historical Jania Steinke, MD  Loperamide HCl (ANTI-DIARRHEAL PO) Take 2 tablets by mouth daily as needed (diarrhea).   Yes Historical Petina Muraski, MD  omeprazole (PRILOSEC) 20 MG capsule Take 20 mg by mouth daily.  Yes Historical Amilee Janvier, MD  ondansetron (ZOFRAN) 4 MG tablet Take 1 tablet (4 mg total) by mouth daily as needed for nausea. 01/31/15  Yes Kinnie Feil, MD  Probiotic Product (ALIGN PO) Take 1 capsule by mouth daily.   Yes Historical Emmitte Surgeon, MD  psyllium (METAMUCIL) 58.6 % powder Take 1 packet by mouth 2 (two) times daily. Mix with 8oz of water   Yes Historical Taheera Thomann, MD  sertraline (ZOLOFT) 50 MG tablet Take 50 mg by mouth daily.  01/14/15  Yes Historical Celise Bazar, MD  traMADol (ULTRAM) 50 MG tablet Take 1 tablet (50 mg total) by mouth every 4 (four) hours as needed. for pain 01/31/15  Yes Kinnie Feil, MD  Vitamin D, Ergocalciferol, (DRISDOL) 50000 UNITS CAPS capsule Take 50,000 Units by mouth daily.   Yes Historical Carlynn Leduc, MD    Physical Exam: Filed Vitals:    02/22/15 0951 02/22/15 1306  BP: 103/53 106/56  Pulse: 111 110  Temp: 97.8 F (36.6 C) 97.7 F (36.5 C)  TempSrc: Oral Oral  Resp: 18 18  SpO2: 98% 100%    Physical Exam  Constitutional: Appears well-developed and well-nourished. No distress.  HENT: Normocephalic. External right and left ear normal. Dry MM Eyes: Conjunctivae and EOM are normal. PERRLA, no scleral icterus.  Neck: Normal ROM. Neck supple. No JVD. No tracheal deviation. No thyromegaly.  CVS: Regular rhythm, tachycardic, S1/S2 +,  no gallops, no carotid bruit.  Pulmonary: Effort and breath sounds normal, no stridor, rhonchi, wheezes, rales.  Abdominal: Soft. BS +,  no distension, tenderness in epigastric area, no rebound or guarding.  Musculoskeletal: Normal range of motion. No edema and no tenderness.  Lymphadenopathy: No lymphadenopathy noted, cervical, inguinal. Neuro: Alert. Normal reflexes, muscle tone coordination. No cranial nerve deficit. Skin: Skin is warm and dry. No rash noted. Not diaphoretic. No erythema. No pallor.  Psychiatric: Normal mood and affect.   Labs on Admission:  Basic Metabolic Panel:  Recent Labs Lab 02/22/15 1036  NA 130*  K 3.6  CL 97*  CO2 23  GLUCOSE 160*  BUN 28*  CREATININE 1.19*  CALCIUM 9.1   Liver Function Tests:  Recent Labs Lab 02/22/15 1036  AST 33  ALT 37  ALKPHOS 83  BILITOT 0.7  PROT 6.3*  ALBUMIN 2.8*    Recent Labs Lab 02/22/15 1036  LIPASE <10*   CBC:  Recent Labs Lab 02/22/15 1036  WBC 35.3*  NEUTROABS 31.4*  HGB 11.4*  HCT 33.6*  MCV 81.2  PLT 449*   EKG: pending    If 7PM-7AM, please contact night-coverage [email or web address] Password [facility] 02/22/2015, 4:37 PM

## 2015-02-23 DIAGNOSIS — A0472 Enterocolitis due to Clostridium difficile, not specified as recurrent: Secondary | ICD-10-CM | POA: Diagnosis present

## 2015-02-23 LAB — BASIC METABOLIC PANEL
Anion gap: 10 (ref 5–15)
BUN: 21 mg/dL — ABNORMAL HIGH (ref 6–20)
CO2: 20 mmol/L — AB (ref 22–32)
CREATININE: 0.91 mg/dL (ref 0.44–1.00)
Calcium: 8.6 mg/dL — ABNORMAL LOW (ref 8.9–10.3)
Chloride: 100 mmol/L — ABNORMAL LOW (ref 101–111)
GFR calc non Af Amer: 56 mL/min — ABNORMAL LOW (ref >60)
GLUCOSE: 109 mg/dL — AB (ref 65–99)
Potassium: 3.5 mmol/L (ref 3.5–5.1)
Sodium: 130 mmol/L — ABNORMAL LOW (ref 135–145)

## 2015-02-23 LAB — CBC
HCT: 32.2 % — ABNORMAL LOW (ref 36.0–46.0)
Hemoglobin: 10.8 g/dL — ABNORMAL LOW (ref 12.0–15.0)
MCH: 27.6 pg (ref 26.0–34.0)
MCHC: 33.5 g/dL (ref 30.0–36.0)
MCV: 82.1 fL (ref 78.0–100.0)
PLATELETS: 418 10*3/uL — AB (ref 150–400)
RBC: 3.92 MIL/uL (ref 3.87–5.11)
RDW: 14.9 % (ref 11.5–15.5)
WBC: 29.4 10*3/uL — ABNORMAL HIGH (ref 4.0–10.5)

## 2015-02-23 LAB — MAGNESIUM: Magnesium: 1.7 mg/dL (ref 1.7–2.4)

## 2015-02-23 MED ORDER — ENSURE ENLIVE PO LIQD
237.0000 mL | Freq: Two times a day (BID) | ORAL | Status: DC
  Administered 2015-02-23 – 2015-03-02 (×7): 237 mL via ORAL

## 2015-02-23 MED ORDER — ENOXAPARIN SODIUM 40 MG/0.4ML ~~LOC~~ SOLN
40.0000 mg | SUBCUTANEOUS | Status: DC
  Administered 2015-02-23 – 2015-03-05 (×11): 40 mg via SUBCUTANEOUS
  Filled 2015-02-23 (×12): qty 0.4

## 2015-02-23 MED ORDER — BOOST / RESOURCE BREEZE PO LIQD
1.0000 | Freq: Every morning | ORAL | Status: DC
  Administered 2015-02-23 – 2015-03-02 (×8): 1 via ORAL

## 2015-02-23 NOTE — Progress Notes (Signed)
Progress Note   Stacy Mcguire JQG:[id number] DOB: Nov 05, 1927 DOA: 02/22/2015 PCP: Elton Sin, FNP   Brief Narrative:   Stacy Mcguire is an 81 y.o. female PMH of ulcerative colitis, GERD on PPI therapy and frequent UTIs who recently completed a course of antibiotics for a recurrent UTI, who was admitted 02/22/15 with a chief complaint of a several day history of abdominal pain and profuse diarrhea. Upon initial evaluation in the ED, her WBC was 35.3 and her creatinine was 1.19. She was admitted with a high clinical suspicion of C. difficile.  Assessment/Plan:   Principal Problem:   Sepsis secondary to C. difficile colitis in the setting of recent outpatient antibiotics use - High clinical suspicion of C. difficile colitis given high WBC and recent use of antibiotics. - CT of the abdomen obtained on admission which was positive for findings consistent with colitis. - C. difficile antigen and toxin studies positive, confirming C. difficile colitis, which is her source of sepsis. - Continue oral vancomycin. Pro calcitonin 0.4. No lactic acidosis noted.  Active Problems:   Weakness generalized - Physical therapy evaluation when medically stable.    Acute renal failure - Likely prerenal in etiology. Improving with IV fluids.    Acute hyponatremia - Likely from dehydration. Continue to hydrate.    Thrombocytosis - Likely from acute infection as this is an acute phase reactant.    DVT Prophylaxis - Lovenox ordered.  Family Communication: Daugher-in-law, Stacy Mcguire at bedside. Disposition Plan: Will need SNF when stable, likely after a 3 day stay. Code Status:     Code Status Orders        Start     Ordered   02/22/15 1743  Full code   Continuous     02/22/15 1742        IV Access:    Peripheral IV   Procedures and diagnostic studies:   Ct Abdomen Pelvis W Contrast  02/22/2015   CLINICAL DATA:  Diarrhea and abdominal pain for 3-4 months.  EXAM: CT ABDOMEN AND  PELVIS WITH CONTRAST  TECHNIQUE: Multidetector CT imaging of the abdomen and pelvis was performed using the standard protocol following bolus administration of intravenous contrast.  CONTRAST:  83mL OMNIPAQUE IOHEXOL 300 MG/ML SOLN, 17mL OMNIPAQUE IOHEXOL 300 MG/ML SOLN  COMPARISON:  01/29/2015  FINDINGS: Lower chest: No pleural or pericardial effusion identified. The lung bases are clear.  Hepatobiliary: No suspicious liver abnormality. Large cyst within left lobe is again noted measuring 8.2 cm on today's exam. Stable from previous exam. The gallbladder is normal. There is no biliary dilatation identified.  Pancreas: Unremarkable appearance of the pancreas.  Spleen: The spleen appears normal.  Adrenals/Urinary Tract: The adrenal glands are both normal. Small cysts are noted in both kidneys. The urinary bladder appears normal.  Stomach/Bowel: There is a large hiatal hernia noted. The small bowel loops have a normal course and caliber. No obstruction. From the distal transverse colon to the rectum there is marked wall thickening, mucosal enhancement and inflammation compatible with colitis. No pneumatosis. No perforation or abscess.  Vascular/Lymphatic: Calcified atherosclerotic disease involves the abdominal aorta. No aneurysm. No enlarged retroperitoneal or mesenteric adenopathy. No enlarged pelvic or inguinal lymph nodes.  Reproductive: Previous hysterectomy.  No adnexal mass.  Other: No free fluid or fluid collections.  Musculoskeletal: There is a scoliosis deformity involving the lumbar spine which is convex towards the right. Multi level degenerative disc disease identified.  IMPRESSION: 1. Examination is positive for acute colitis  involving the distal transverse colon through the rectum. This may be inflammatory or infectious in etiology. Ischemic colitis not excluded. Negative for pneumatosis or perforation. 2. Aortic atherosclerosis. 3. Scoliosis and degenerative disc disease.   Electronically Signed   By:  Kerby Moors M.D.   On: 02/22/2015 13:57   Dg Chest Port 1 View  02/22/2015   CLINICAL DATA:  Sepsis. Dehydration and diarrhea. Shortness of breath. Nonsmoker.  EXAM: PORTABLE CHEST 1 VIEW  COMPARISON:  01/29/2015  FINDINGS: Numerous leads and wires project over the chest. Midline trachea. Normal heart size and mediastinal contours for age. No pleural effusion or pneumothorax. Biapical pleural thickening. Moderate hiatal hernia. Clear lungs.  IMPRESSION: No acute cardiopulmonary disease.  Hiatal hernia.   Electronically Signed   By: Abigail Miyamoto M.D.   On: 02/22/2015 18:22     Medical Consultants:    None.  Anti-Infectives:   Anti-infectives    Start     Dose/Rate Route Frequency Ordered Stop   02/22/15 1800  vancomycin (VANCOCIN) 50 mg/mL oral solution 125 mg     125 mg Oral 4 times per day 02/22/15 1537        Subjective:   Stacy Mcguire feels very weak and worn out.  She continues to have diarrhea and a poor appetite.  No N/V.  Some abdominal discomfort.  No chest pain/SOB.  Objective:    Filed Vitals:   02/22/15 1659 02/22/15 1806 02/22/15 2116 02/23/15 0519  BP: 123/64  132/51 132/72  Pulse: 92  99 103  Temp: 98.2 F (36.8 C)  97.8 F (36.6 C) 98.9 F (37.2 C)  TempSrc: Oral  Oral Oral  Resp: 18  18 16   Height:  5\' 4"  (1.626 m)    Weight:  52.4 kg (115 lb 8.3 oz)  55.1 kg (121 lb 7.6 oz)  SpO2: 100%  97% 96%    Intake/Output Summary (Last 24 hours) at 02/23/15 0726 Last data filed at 02/23/15 0500  Gross per 24 hour  Intake  732.5 ml  Output      5 ml  Net  727.5 ml   Filed Weights   02/22/15 1806 02/23/15 0519  Weight: 52.4 kg (115 lb 8.3 oz) 55.1 kg (121 lb 7.6 oz)    Exam: Gen:  Depressed affect Cardiovascular:  Tachy, No M/R/G Respiratory:  Lungs CTAB Gastrointestinal:  Abdomen soft, mildly tender, + BS Extremities:  No C/E/C   Data Reviewed:    Labs: Basic Metabolic Panel:  Recent Labs Lab 02/22/15 1036 02/22/15 1830 02/23/15 0615    NA 130*  --  130*  K 3.6  --  3.5  CL 97*  --  100*  CO2 23  --  20*  GLUCOSE 160*  --  109*  BUN 28*  --  21*  CREATININE 1.19* 1.01* 0.91  CALCIUM 9.1  --  8.6*   GFR Estimated Creatinine Clearance: 39.7 mL/min (by C-G formula based on Cr of 0.91). Liver Function Tests:  Recent Labs Lab 02/22/15 1036  AST 33  ALT 37  ALKPHOS 83  BILITOT 0.7  PROT 6.3*  ALBUMIN 2.8*    Recent Labs Lab 02/22/15 1036  LIPASE <10*   Coagulation profile  Recent Labs Lab 02/22/15 1830  INR 1.17    CBC:  Recent Labs Lab 02/22/15 1036 02/22/15 1830 02/23/15 0615  WBC 35.3* 29.8* 29.4*  NEUTROABS 31.4*  --   --   HGB 11.4* 10.9* 10.8*  HCT 33.6* 32.5* 32.2*  MCV  81.2 82.9 82.1  PLT 449* 429* 418*   Sepsis Labs:  Recent Labs Lab 02/22/15 1036 02/22/15 1045 02/22/15 1440 02/22/15 1830 02/22/15 2152 02/23/15 0615  PROCALCITON  --   --   --  0.40  --   --   WBC 35.3*  --   --  29.8*  --  29.4*  LATICACIDVEN  --  1.17 1.48 1.2 1.2  --    Microbiology Recent Results (from the past 240 hour(s))  C difficile quick scan w PCR reflex     Status: Abnormal   Collection Time: 02/22/15  3:22 PM  Result Value Ref Range Status   C Diff antigen POSITIVE (A) NEGATIVE Final   C Diff toxin POSITIVE (A) NEGATIVE Final   C Diff interpretation Positive for toxigenic C. difficile  Final    Comment: CRITICAL RESULT CALLED TO, READ BACK BY AND VERIFIED WITH: C HALL AT 1618 ON 09.26.2016 BY NBROOKS      Medications:   . bifidobacterium infantis  1 capsule Oral Daily  . enoxaparin (LOVENOX) injection  30 mg Subcutaneous Q24H  . sertraline  50 mg Oral Daily  . sodium chloride  3 mL Intravenous Q12H  . vancomycin  125 mg Oral 4 times per day   Continuous Infusions: . sodium chloride 75 mL/hr at 02/22/15 1914    Time spent: 35 minutes with > 50% of time discussing current diagnostic test results, clinical impression and plan of care.   LOS: 1 day   RAMA,CHRISTINA  [facility] Pager [phone]. If unable to reach me by pager, please call my cell phone at [phone].  *Please refer to [email or web address], password [email or web address] to get updated schedule on who will round on this patient, as hospitalists switch teams weekly. If 7PM-7AM, please contact night-coverage at [email or web address], password [email or web address] for any overnight needs.  02/23/2015, 7:26 AM

## 2015-02-23 NOTE — Progress Notes (Signed)
Initial Nutrition Assessment  DOCUMENTATION CODES:   Not applicable  INTERVENTION:  - Continue Ensure Enlive po BID, each supplement provides 350 kcal and 20 grams of protein - Will also order Boost Breeze once/day, this supplement provides 250 kcal and 9 grams of protein as pt may better tolerate clear liquid supplement at this time - RD will continue to monitor for needs  NUTRITION DIAGNOSIS:   Inadequate oral intake related to acute illness, nausea, vomiting as evidenced by per patient/family report.  GOAL:   Patient will meet greater than or equal to 90% of their needs  MONITOR:   PO intake, Supplement acceptance, Weight trends, Labs, I & O's  REASON FOR ASSESSMENT:   Malnutrition Screening Tool  ASSESSMENT:   81 yo female who presented to [facility] ED with main concern of several days duration of progressively worsening watery diarrhea over 10 episodes per day, associated with abd cramps, intermittent in nature and involving lower abd quadrants, occasionally radiating to the entire abdomen. This has been associated with poor oral intake and malaise. Pt reports she was recently treated with ABX for UTI. She denies similar events in the past. No fevers, chills, no urinary concerns.   Pt seen for MST. BMI indicates normal weight status. Pt refuses physical assessment at this time; she likely meets criteria for malnutrition but unable to confirm at this time.  Lunch tray in room is untouched and pt states she was unable to consume breakfast this AM. She denies abdominal pain or nausea at this time but states that this was occuring for 1-2 weeks PTA. Notes also indicate diarrhea during this time frame.  Pt did not feel up to talking further or answering further questions at this time. Will need to obtain more information about intakes PTA at follow-up as well as recent weight trends and UBW PTA.  Not meeting needs. Continue Ensure Enlive but will also order Boost Breeze once/day and  assess tolerance/acceptance of this supplement as pt may better tolerate clear liquid supplement at this time. Medications reviewed. Labs reviewed; Na: 130 mmol/L, Cl: 100 mmol/L, BUN elevated, Ca: 8.6 mg/dL, GFR: 56.   Diet Order:  Diet regular Room service appropriate?: Yes; Fluid consistency:: Thin  Skin:  Reviewed, no issues  Last BM:  9/26  Height:   Ht Readings from Last 1 Encounters:  02/22/15 5\' 4"  (1.626 m)    Weight:   Wt Readings from Last 1 Encounters:  02/23/15 121 lb 7.6 oz (55.1 kg)    Ideal Body Weight:  54.54 kg (kg)  BMI:  Body mass index is 20.84 kg/(m^2).  Estimated Nutritional Needs:   Kcal:  1370-1570  Protein:  55-65 grams  Fluid:  2.2-2.5 L/day  EDUCATION NEEDS:   No education needs identified at this time     Jarome Matin, RD, LDN Inpatient Clinical Dietitian Pager # [phone] After hours/weekend pager # [phone]

## 2015-02-23 NOTE — Progress Notes (Signed)
PT Cancellation Note  Patient Details Name: Stacy Mcguire MRN: [id number] DOB: [birth year]   Cancelled Treatment:    Reason Eval/Treat Not Completed: Fatigue/lethargy limiting ability to participate   [facility] 02/23/2015, 2:20 PM

## 2015-02-24 LAB — BASIC METABOLIC PANEL
ANION GAP: 10 (ref 5–15)
BUN: 18 mg/dL (ref 6–20)
CO2: 19 mmol/L — AB (ref 22–32)
CREATININE: 0.83 mg/dL (ref 0.44–1.00)
Calcium: 8.3 mg/dL — ABNORMAL LOW (ref 8.9–10.3)
Chloride: 103 mmol/L (ref 101–111)
GFR calc non Af Amer: >60 mL/min (ref >60)
Glucose, Bld: 79 mg/dL (ref 65–99)
Potassium: 3.2 mmol/L — ABNORMAL LOW (ref 3.5–5.1)
SODIUM: 132 mmol/L — AB (ref 135–145)

## 2015-02-24 LAB — CBC
HCT: 29.3 % — ABNORMAL LOW (ref 36.0–46.0)
HEMOGLOBIN: 9.7 g/dL — AB (ref 12.0–15.0)
MCH: 27.2 pg (ref 26.0–34.0)
MCHC: 33.1 g/dL (ref 30.0–36.0)
MCV: 82.1 fL (ref 78.0–100.0)
Platelets: 401 10*3/uL — ABNORMAL HIGH (ref 150–400)
RBC: 3.57 MIL/uL — AB (ref 3.87–5.11)
RDW: 14.9 % (ref 11.5–15.5)
WBC: 19 10*3/uL — AB (ref 4.0–10.5)

## 2015-02-24 LAB — GI PATHOGEN PANEL BY PCR, STOOL
Campylobacter by PCR: NOT DETECTED
Cryptosporidium by PCR: NOT DETECTED
E COLI (ETEC) LT/ST: NOT DETECTED
E COLI (STEC): NOT DETECTED
E coli 0157 by PCR: NOT DETECTED
G lamblia by PCR: NOT DETECTED
Norovirus GI/GII: NOT DETECTED
Rotavirus A by PCR: NOT DETECTED
SALMONELLA BY PCR: NOT DETECTED
Shigella by PCR: NOT DETECTED

## 2015-02-24 MED ORDER — SODIUM CHLORIDE 0.9 % IV SOLN
INTRAVENOUS | Status: DC
  Administered 2015-02-24: 09:00:00 via INTRAVENOUS

## 2015-02-24 NOTE — Progress Notes (Signed)
PT Cancellation Note  Patient Details Name: Stacy Mcguire MRN: [id number] DOB: April 22, 1928   Cancelled Treatment:    Reason Eval/Treat Not Completed: Medical issues which prohibited therapy (still w/ diarrhea)   Claretha Cooper 02/24/2015, 4:37 PM Tresa Endo PT [phone]

## 2015-02-24 NOTE — Progress Notes (Signed)
[facility] PROGRESS NOTE Interim History: 81 year old with past medical history of ulcerative colitis on a PPI and frequent UTIs who completed a course of antibiotic, admitted on 02/22/2015 with chief complaint of abdominal pain and profuse diarrhea, on admission she was found to have a white count of 35 with a creatinine of 1.1.   Assessment/Plan: Sepsis due to C. difficile colitis - C. difficile colitis positive on 02/22/2015, she was started empirically on vancomycin on 02/22/2015. - Leukocytosis is trending down, she has remained afebrile and blood pressure has been stable. - SHecontinues to have watery stool with abdominal pain. - Change her diet to clears use Zofran for nausea.  Acute renal failure: - Likely due to hypovolemia due to diarrhea. - It resolve with IV fluid hydration.   Acute hyponatremia - Prerenal in etiology improving with IV fluid hydration.  Thrombocytosis: - Due to infectious etiology.   Family Communication: Daughter-in-law Disposition Plan: will need SNF   Consultants:  none  Procedures:  CT abd and pelvis  Antibiotics:  Oral vanc  HPI/Subjective: She relates she feels weak and nauseated with food. Continues to have watery stool and discomfort with every bowel movement.  Objective: Filed Vitals:   02/23/15 0519 02/23/15 1545 02/23/15 2247 02/24/15 0454  BP: 132/72 133/74 133/65 134/56  Pulse: 103 100 101 90  Temp: 98.9 F (37.2 C) 98 F (36.7 C) 98.1 F (36.7 C) 97.9 F (36.6 C)  TempSrc: Oral Oral Oral Oral  Resp: 16 20 18 18   Height:      Weight: 55.1 kg (121 lb 7.6 oz)   54.8 kg (120 lb 13 oz)  SpO2: 96% 99% 96% 94%   No intake or output data in the 24 hours ending 02/24/15 0833 Filed Weights   02/22/15 1806 02/23/15 0519 02/24/15 0454  Weight: 52.4 kg (115 lb 8.3 oz) 55.1 kg (121 lb 7.6 oz) 54.8 kg (120 lb 13 oz)    Exam:  General: Alert, awake, oriented x3, in no acute distress.  HEENT: No bruits, no  goiter.  Heart: Regular rate and rhythm. Lungs: Good air movement, clear Abdomen: Soft, diffuse tenderness nondistended, positive bowel sounds.  Neuro: Grossly intact, nonfocal.   Data Reviewed: Basic Metabolic Panel:  Recent Labs Lab 02/22/15 1036 02/22/15 1830 02/23/15 0615 02/24/15 0545  NA 130*  --  130* 132*  K 3.6  --  3.5 3.2*  CL 97*  --  100* 103  CO2 23  --  20* 19*  GLUCOSE 160*  --  109* 79  BUN 28*  --  21* 18  CREATININE 1.19* 1.01* 0.91 0.83  CALCIUM 9.1  --  8.6* 8.3*  MG  --   --  1.7  --    Liver Function Tests:  Recent Labs Lab 02/22/15 1036  AST 33  ALT 37  ALKPHOS 83  BILITOT 0.7  PROT 6.3*  ALBUMIN 2.8*    Recent Labs Lab 02/22/15 1036  LIPASE <10*   No results for input(s): AMMONIA in the last 168 hours. CBC:  Recent Labs Lab 02/22/15 1036 02/22/15 1830 02/23/15 0615 02/24/15 0545  WBC 35.3* 29.8* 29.4* 19.0*  NEUTROABS 31.4*  --   --   --   HGB 11.4* 10.9* 10.8* 9.7*  HCT 33.6* 32.5* 32.2* 29.3*  MCV 81.2 82.9 82.1 82.1  PLT 449* 429* 418* 401*   Cardiac Enzymes: No results for input(s): CKTOTAL, CKMB, CKMBINDEX, TROPONINI in the last 168 hours. BNP (last 3 results) No results for input(s):  BNP in the last 8760 hours.  ProBNP (last 3 results) No results for input(s): PROBNP in the last 8760 hours.  CBG: No results for input(s): GLUCAP in the last 168 hours.  Recent Results (from the past 240 hour(s))  C difficile quick scan w PCR reflex     Status: Abnormal   Collection Time: 02/22/15  3:22 PM  Result Value Ref Range Status   C Diff antigen POSITIVE (A) NEGATIVE Final   C Diff toxin POSITIVE (A) NEGATIVE Final   C Diff interpretation Positive for toxigenic C. difficile  Final    Comment: CRITICAL RESULT CALLED TO, READ BACK BY AND VERIFIED WITH: C HALL AT 1618 ON 09.26.2016 BY NBROOKS   Culture, blood (x 2)     Status: None (Preliminary result)   Collection Time: 02/22/15  6:30 PM  Result Value Ref Range Status    Specimen Description BLOOD RIGHT ARM  Final   Special Requests BOTTLES DRAWN AEROBIC AND ANAEROBIC 5CC  Final   Culture   Final    NO GROWTH < 24 HOURS Performed at [facility]    Report Status PENDING  Incomplete  Culture, blood (x 2)     Status: None (Preliminary result)   Collection Time: 02/22/15  6:41 PM  Result Value Ref Range Status   Specimen Description BLOOD RIGHT HAND  Final   Special Requests IN PEDIATRIC BOTTLE 3CC  Final   Culture   Final    NO GROWTH < 24 HOURS Performed at [facility]    Report Status PENDING  Incomplete     Studies: Ct Abdomen Pelvis W Contrast  02/22/2015   CLINICAL DATA:  Diarrhea and abdominal pain for 3-4 months.  EXAM: CT ABDOMEN AND PELVIS WITH CONTRAST  TECHNIQUE: Multidetector CT imaging of the abdomen and pelvis was performed using the standard protocol following bolus administration of intravenous contrast.  CONTRAST:  98mL OMNIPAQUE IOHEXOL 300 MG/ML SOLN, 39mL OMNIPAQUE IOHEXOL 300 MG/ML SOLN  COMPARISON:  01/29/2015  FINDINGS: Lower chest: No pleural or pericardial effusion identified. The lung bases are clear.  Hepatobiliary: No suspicious liver abnormality. Large cyst within left lobe is again noted measuring 8.2 cm on today's exam. Stable from previous exam. The gallbladder is normal. There is no biliary dilatation identified.  Pancreas: Unremarkable appearance of the pancreas.  Spleen: The spleen appears normal.  Adrenals/Urinary Tract: The adrenal glands are both normal. Small cysts are noted in both kidneys. The urinary bladder appears normal.  Stomach/Bowel: There is a large hiatal hernia noted. The small bowel loops have a normal course and caliber. No obstruction. From the distal transverse colon to the rectum there is marked wall thickening, mucosal enhancement and inflammation compatible with colitis. No pneumatosis. No perforation or abscess.  Vascular/Lymphatic: Calcified atherosclerotic disease involves the abdominal  aorta. No aneurysm. No enlarged retroperitoneal or mesenteric adenopathy. No enlarged pelvic or inguinal lymph nodes.  Reproductive: Previous hysterectomy.  No adnexal mass.  Other: No free fluid or fluid collections.  Musculoskeletal: There is a scoliosis deformity involving the lumbar spine which is convex towards the right. Multi level degenerative disc disease identified.  IMPRESSION: 1. Examination is positive for acute colitis involving the distal transverse colon through the rectum. This may be inflammatory or infectious in etiology. Ischemic colitis not excluded. Negative for pneumatosis or perforation. 2. Aortic atherosclerosis. 3. Scoliosis and degenerative disc disease.   Electronically Signed   By: Kerby Moors M.D.   On: 02/22/2015 13:57   Dg  Chest Port 1 View  02/22/2015   CLINICAL DATA:  Sepsis. Dehydration and diarrhea. Shortness of breath. Nonsmoker.  EXAM: PORTABLE CHEST 1 VIEW  COMPARISON:  01/29/2015  FINDINGS: Numerous leads and wires project over the chest. Midline trachea. Normal heart size and mediastinal contours for age. No pleural effusion or pneumothorax. Biapical pleural thickening. Moderate hiatal hernia. Clear lungs.  IMPRESSION: No acute cardiopulmonary disease.  Hiatal hernia.   Electronically Signed   By: Abigail Miyamoto M.D.   On: 02/22/2015 18:22    Scheduled Meds: . bifidobacterium infantis  1 capsule Oral Daily  . enoxaparin (LOVENOX) injection  40 mg Subcutaneous Q24H  . feeding supplement  1 Container Oral q morning - 10a  . feeding supplement (ENSURE ENLIVE)  237 mL Oral BID BM  . sertraline  50 mg Oral Daily  . sodium chloride  3 mL Intravenous Q12H  . vancomycin  125 mg Oral 4 times per day   Continuous Infusions: . sodium chloride 75 mL/hr at 02/23/15 2111    Time Spent: 25 min   Charlynne Cousins  [facility] Pager [phone].  If 7PM-7AM, please contact night-coverage at [email or web address], password [facility] 02/24/2015, 8:33 AM  LOS: 2 days

## 2015-02-24 NOTE — Clinical Social Work Note (Signed)
Clinical Social Work Assessment  Patient Details  Name: EMRIE GAYLE MRN: [id number] Date of Birth: Jun 22, 1927  Date of referral:  02/23/15               Reason for consult:  Facility Placement                Permission sought to share information with:  Facility Art therapist granted to share information::  Yes, Verbal Permission Granted  Name::        Agency::     Relationship::  Daughter in [place] and Son   Contact Information:  Catalina Pizza [phone]) and [name] [phone])  Housing/Transportation Living arrangements for the past 2 months:  [place] of Information:  Patient Patient Interpreter Needed:  None Criminal Activity/Legal Involvement Pertinent to Current Situation/Hospitalization:  No - Comment as needed Significant Relationships:  Adult Children Lives with:  Self, Pets Do you feel safe going back to the place where you live?  Yes Need for family participation in patient care:  Yes (Comment)  Care giving concerns:  Patient currently lives alone with her dog at this time. Current concern in regard to patient's ablity to complete ADL's independently at home.    Social Worker assessment / plan:  CSW met with patient by bedside to discuss anticipated discharge plan when patient is medically stable. Patient states that she is agreeable with SNF placement upon discharge if recommended by PT/OT. Patient reported that she desires to remain in [place] if SNF placement is needed. Patient provided CSW with permission to contact patient's daughter-in-law and son (preferring Probation officer to contact daughter-in-law first and then son if daughter-in-law cannot be reached). CSW to continue following patient and provide assistance with SNF placement after patient is evaluated by PT/OT and recommendation is given.   Employment status:  Disabled (Comment on whether or not currently receiving Disability) Insurance information:  Medicare PT  Recommendations:  Not assessed at this time Information / Referral to community resources:  [place]  Patient/Family's Response to care:  Patient is agreeable with SNF placement if recommended by PT/OT  Patient/Family's Understanding of and Emotional Response to Diagnosis, Current Treatment, and Prognosis:  Patient verbalized understanding with diagnosis, current treatment, and anticipated plan after evaluation is completed by PT/OT  Emotional Assessment Appearance:  Appears stated age Attitude/Demeanor/Rapport:  Other (Patient is calm and cooperative) Affect (typically observed):  Appropriate Orientation:  Oriented to Self, Oriented to Place, Oriented to  Time, Oriented to Situation Alcohol / Substance use:  Not Applicable Psych involvement (Current and /or in the community):  No (Comment)  Discharge Needs  Concerns to be addressed:  Discharge Planning Concerns Readmission within the last 30 days:  Yes Current discharge risk:  Physical Impairment, Lives alone Barriers to Discharge:  Barriers Resolved   Harriet Masson, LCSW 02/24/2015, 4:31 PM

## 2015-02-25 DIAGNOSIS — E872 Acidosis, unspecified: Secondary | ICD-10-CM

## 2015-02-25 DIAGNOSIS — E876 Hypokalemia: Secondary | ICD-10-CM

## 2015-02-25 LAB — CBC
HCT: 31 % — ABNORMAL LOW (ref 36.0–46.0)
HEMOGLOBIN: 10.5 g/dL — AB (ref 12.0–15.0)
MCH: 27.7 pg (ref 26.0–34.0)
MCHC: 33.9 g/dL (ref 30.0–36.0)
MCV: 81.8 fL (ref 78.0–100.0)
PLATELETS: 442 10*3/uL — AB (ref 150–400)
RBC: 3.79 MIL/uL — ABNORMAL LOW (ref 3.87–5.11)
RDW: 14.7 % (ref 11.5–15.5)
WBC: 13.1 10*3/uL — AB (ref 4.0–10.5)

## 2015-02-25 LAB — BASIC METABOLIC PANEL
Anion gap: 7 (ref 5–15)
BUN: 11 mg/dL (ref 6–20)
CALCIUM: 8.2 mg/dL — AB (ref 8.9–10.3)
CO2: 22 mmol/L (ref 22–32)
CREATININE: 0.67 mg/dL (ref 0.44–1.00)
Chloride: 102 mmol/L (ref 101–111)
Glucose, Bld: 130 mg/dL — ABNORMAL HIGH (ref 65–99)
Potassium: 2.5 mmol/L — CL (ref 3.5–5.1)
SODIUM: 131 mmol/L — AB (ref 135–145)

## 2015-02-25 MED ORDER — POTASSIUM CHLORIDE CRYS ER 20 MEQ PO TBCR
40.0000 meq | EXTENDED_RELEASE_TABLET | Freq: Two times a day (BID) | ORAL | Status: AC
  Administered 2015-02-25 (×2): 40 meq via ORAL
  Filled 2015-02-25 (×2): qty 2

## 2015-02-25 MED ORDER — FIDAXOMICIN 200 MG PO TABS: 200.0000 mg | ORAL_TABLET | Freq: Two times a day (BID) | ORAL | Status: DC

## 2015-02-25 MED ORDER — SODIUM BICARBONATE 8.4 % IV SOLN
INTRAVENOUS | Status: DC
  Administered 2015-02-25: 10:00:00 via INTRAVENOUS
  Filled 2015-02-25 (×4): qty 1000

## 2015-02-25 MED ORDER — SACCHAROMYCES BOULARDII 250 MG PO CAPS
250.0000 mg | ORAL_CAPSULE | Freq: Two times a day (BID) | ORAL | Status: DC
  Administered 2015-02-25 – 2015-03-06 (×19): 250 mg via ORAL
  Filled 2015-02-25 (×21): qty 1

## 2015-02-25 MED ORDER — METRONIDAZOLE IN NACL 5-0.79 MG/ML-% IV SOLN
500.0000 mg | Freq: Three times a day (TID) | INTRAVENOUS | Status: DC
  Administered 2015-02-25 – 2015-03-01 (×13): 500 mg via INTRAVENOUS
  Filled 2015-02-25 (×17): qty 100

## 2015-02-25 MED ORDER — VANCOMYCIN 50 MG/ML ORAL SOLUTION
500.0000 mg | Freq: Four times a day (QID) | ORAL | Status: DC
  Administered 2015-02-25 – 2015-03-01 (×17): 500 mg via ORAL
  Filled 2015-02-25 (×21): qty 10

## 2015-02-25 MED ORDER — VANCOMYCIN HCL 500 MG IV SOLR
500.0000 mg | Freq: Four times a day (QID) | Status: DC
  Administered 2015-02-25 – 2015-02-26 (×4): 500 mg via RECTAL
  Filled 2015-02-25 (×9): qty 500

## 2015-02-25 NOTE — Clinical Social Work Placement (Signed)
   CLINICAL SOCIAL WORK PLACEMENT  NOTE  Date:  02/25/2015  Patient Details  Name: Stacy Mcguire MRN: [id number] Date of Birth: September 13, 1927  Clinical Social Work is seeking post-discharge placement for this patient at the [place] level of care (*CSW will initial, date and re-position this form in  chart as items are completed):  Yes   Patient/family provided with [employer] Work Department's list of facilities offering this level of care within the geographic area requested by the patient (or if unable, by the patient's family).  Yes   Patient/family informed of their freedom to choose among providers that offer the needed level of care, that participate in Medicare, Medicaid or managed care program needed by the patient, have an available bed and are willing to accept the patient.  Yes   Patient/family informed of [place]'s ownership interest in [facility] and [facility], as well as of the fact that they are under no obligation to receive care at these facilities.  PASRR submitted to EDS on 02/24/15     PASRR number received on 02/24/15     Existing PASRR number confirmed on       FL2 transmitted to all facilities in geographic area requested by pt/family on 02/24/15     FL2 transmitted to all facilities within larger geographic area on       Patient informed that his/her managed care company has contracts with or will negotiate with certain facilities, including the following:        Yes   Patient/family informed of bed offers received.  Patient chooses bed at       Physician recommends and patient chooses bed at      Patient to be transferred to   on  .  Patient to be transferred to facility by       Patient family notified on   of transfer.  Name of family member notified:        PHYSICIAN Please sign FL2     Additional Comment:    _______________________________________________ Ladell Pier, LCSW 02/25/2015,  5:26 PM

## 2015-02-25 NOTE — Progress Notes (Addendum)
[facility] PROGRESS NOTE Interim History: 81 year old with past medical history of ulcerative colitis on a PPI and frequent UTIs who completed a course of antibiotic, admitted on 02/22/2015 with chief complaint of abdominal pain and profuse diarrhea, on admission she was found to have a white count of 35 with a creatinine of 1.1.   Assessment/Plan: Sepsis due to Refractory C. difficile colitis - C. difficile colitis positive on 02/22/2015, she was started empirically on vancomycin on 02/22/2015. - She continues to have watery stool with abdominal pain, we'll add floor store and vancomycin enemas and flagyl IV. - Continue clear liquid diet so friend when necessary for nausea.  Acute renal failure: - Likely due to hypovolemia due to diarrhea. - It resolve with IV fluid hydration.   Acute hyponatremia - Prerenal in etiology improving with IV fluid hydration.  Hypokalemia: Likely the diarrhea, replete orally.  Metabolic acidosis: Without any gap less than 14, which change her IV fluids to D5 with bicarbonate. Check a basic metabolic panel in the morning. Thrombocytosis: - Due to infectious etiology.   Family Communication: Daughter-in-law Disposition Plan: will need SNF   Consultants:  none  Procedures:  CT abd and pelvis  Antibiotics:  Oral vanc  HPI/Subjective: Continues to have watery stool and discomfort with every bowel movement.  Objective: Filed Vitals:   02/24/15 0454 02/24/15 1744 02/24/15 2042 02/25/15 0503  BP: 134/56 141/63 142/68 146/68  Pulse: 90 87 92 92  Temp: 97.9 F (36.6 C) 98.1 F (36.7 C) 97.7 F (36.5 C) 97.9 F (36.6 C)  TempSrc: Oral Oral Oral Oral  Resp: 18 16 20 18   Height:      Weight: 54.8 kg (120 lb 13 oz)   54.5 kg (120 lb 2.4 oz)  SpO2: 94% 96% 97% 95%    Intake/Output Summary (Last 24 hours) at 02/25/15 0828 Last data filed at 02/25/15 0600  Gross per 24 hour  Intake 1713.75 ml  Output      0 ml  Net 1713.75  ml   Filed Weights   02/23/15 0519 02/24/15 0454 02/25/15 0503  Weight: 55.1 kg (121 lb 7.6 oz) 54.8 kg (120 lb 13 oz) 54.5 kg (120 lb 2.4 oz)    Exam:  General: Alert, awake, oriented x3, in no acute distress.  HEENT: No bruits, no goiter.  Heart: Regular rate and rhythm. Lungs: Good air movement, clear Abdomen: Soft, diffuse tenderness nondistended, positive bowel sounds.  Neuro: Grossly intact, nonfocal.   Data Reviewed: Basic Metabolic Panel:  Recent Labs Lab 02/22/15 1036 02/22/15 1830 02/23/15 0615 02/24/15 0545  NA 130*  --  130* 132*  K 3.6  --  3.5 3.2*  CL 97*  --  100* 103  CO2 23  --  20* 19*  GLUCOSE 160*  --  109* 79  BUN 28*  --  21* 18  CREATININE 1.19* 1.01* 0.91 0.83  CALCIUM 9.1  --  8.6* 8.3*  MG  --   --  1.7  --    Liver Function Tests:  Recent Labs Lab 02/22/15 1036  AST 33  ALT 37  ALKPHOS 83  BILITOT 0.7  PROT 6.3*  ALBUMIN 2.8*    Recent Labs Lab 02/22/15 1036  LIPASE <10*   No results for input(s): AMMONIA in the last 168 hours. CBC:  Recent Labs Lab 02/22/15 1036 02/22/15 1830 02/23/15 0615 02/24/15 0545  WBC 35.3* 29.8* 29.4* 19.0*  NEUTROABS 31.4*  --   --   --  HGB 11.4* 10.9* 10.8* 9.7*  HCT 33.6* 32.5* 32.2* 29.3*  MCV 81.2 82.9 82.1 82.1  PLT 449* 429* 418* 401*   Cardiac Enzymes: No results for input(s): CKTOTAL, CKMB, CKMBINDEX, TROPONINI in the last 168 hours. BNP (last 3 results) No results for input(s): BNP in the last 8760 hours.  ProBNP (last 3 results) No results for input(s): PROBNP in the last 8760 hours.  CBG: No results for input(s): GLUCAP in the last 168 hours.  Recent Results (from the past 240 hour(s))  C difficile quick scan w PCR reflex     Status: Abnormal   Collection Time: 02/22/15  3:22 PM  Result Value Ref Range Status   C Diff antigen POSITIVE (A) NEGATIVE Final   C Diff toxin POSITIVE (A) NEGATIVE Final   C Diff interpretation Positive for toxigenic C. difficile  Final     Comment: CRITICAL RESULT CALLED TO, READ BACK BY AND VERIFIED WITH: C HALL AT 1618 ON 09.26.2016 BY NBROOKS   Culture, blood (x 2)     Status: None (Preliminary result)   Collection Time: 02/22/15  6:30 PM  Result Value Ref Range Status   Specimen Description BLOOD RIGHT ARM  Final   Special Requests BOTTLES DRAWN AEROBIC AND ANAEROBIC 5CC  Final   Culture   Final    NO GROWTH 2 DAYS Performed at [facility]    Report Status PENDING  Incomplete  Culture, blood (x 2)     Status: None (Preliminary result)   Collection Time: 02/22/15  6:41 PM  Result Value Ref Range Status   Specimen Description BLOOD RIGHT HAND  Final   Special Requests IN PEDIATRIC BOTTLE 3CC  Final   Culture   Final    NO GROWTH 2 DAYS Performed at [facility]    Report Status PENDING  Incomplete     Studies: No results found.  Scheduled Meds: . bifidobacterium infantis  1 capsule Oral Daily  . enoxaparin (LOVENOX) injection  40 mg Subcutaneous Q24H  . feeding supplement  1 Container Oral q morning - 10a  . feeding supplement (ENSURE ENLIVE)  237 mL Oral BID BM  . sertraline  50 mg Oral Daily  . sodium chloride  3 mL Intravenous Q12H  . vancomycin  125 mg Oral 4 times per day   Continuous Infusions: . sodium chloride 75 mL/hr at 02/24/15 0845    Time Spent: 25 min   Charlynne Cousins  [facility] Pager [phone].  If 7PM-7AM, please contact night-coverage at [email or web address], password [facility] 02/25/2015, 8:28 AM  LOS: 3 days

## 2015-02-25 NOTE — Care Management Important Message (Signed)
Important Message  Patient Details  Name: JALEEA ALESI MRN: [id number] Date of Birth: [birth year]   Medicare Important Message Given:  Yes-second notification given    Shelda Altes 02/25/2015, 3:34 [place] Message  Patient Details  Name: TRACE CEDERBERG MRN: [id number] Date of Birth: 17-Jan-1928   Medicare Important Message Given:  Yes-second notification given    Shelda Altes 02/25/2015, 3:33 PM

## 2015-02-25 NOTE — Evaluation (Signed)
Physical Therapy Evaluation Patient Details Name: Stacy Mcguire MRN: [id number] DOB: [birth year] Today's Date: 02/25/2015   History of Present Illness  81 y.o. female admitted with diarrhea. Dx of c diff, sepsis, ARF.   Clinical Impression  Pt admitted with above diagnosis. Pt currently with functional limitations due to the deficits listed below (see PT Problem List). Min assist for stand pivot transfers, pt fatigues very quickly. She is agreeable to ST-SNF.  Pt will benefit from skilled PT to increase their independence and safety with mobility to allow discharge to the venue listed below.       Follow Up Recommendations SNF;Supervision/Assistance - 24 hour    Equipment Recommendations  3in1 (PT)    Recommendations for Other Services       Precautions / Restrictions Precautions Precautions: Fall Restrictions Weight Bearing Restrictions: No      Mobility  Bed Mobility Overal bed mobility: Modified Independent             General bed mobility comments: HOB up 45*, used rail  Transfers Overall transfer level: Needs assistance Equipment used: None Transfers: Sit to/from [facility] Sit to Stand: Min assist Stand pivot transfers: Min assist       General transfer comment: SPT bed to [facility], then BSC to recliner, used armrests for UE support, min A to steady, poorly controlled descent with stand to sit, fatigues quickly  Ambulation/Gait             General Gait Details: NT- fatigued with transfers  Stairs            Wheelchair Mobility    Modified Rankin (Stroke Patients Only)       Balance Overall balance assessment: Needs assistance   Sitting balance-Leahy Scale: Good     Standing balance support: Bilateral upper extremity supported Standing balance-Leahy Scale: Poor Standing balance comment: requires BUE support                             Pertinent Vitals/Pain Pain Assessment: No/denies pain    Home  Living Family/patient expects to be discharged to:: Private residence Living Arrangements: Alone Available Help at Discharge: Family;Available PRN/intermittently Type of Home: House       Home Layout: One level Home Equipment: [place] - 2 wheels;Shower seat      Prior Function Level of Independence: Independent         Comments: walked without AD, denies falls, able to do housework/cooking without assist     Hand Dominance        Extremity/Trunk Assessment   Upper Extremity Assessment: Generalized weakness           Lower Extremity Assessment: Generalized weakness      Cervical / Trunk Assessment: Kyphotic  Communication   Communication: No difficulties  Cognition Arousal/Alertness: Awake/alert Behavior During Therapy: WFL for tasks assessed/performed Overall Cognitive Status: Within Functional Limits for tasks assessed                      General Comments      Exercises        Assessment/Plan    PT Assessment Patient needs continued PT services  PT Diagnosis Difficulty walking;Generalized weakness   PT Problem List Decreased strength;Decreased activity tolerance;Decreased balance;Decreased mobility  PT Treatment Interventions Gait training;Functional mobility training;Therapeutic activities;Patient/family education;Therapeutic exercise;Balance training;DME instruction   PT Goals (Current goals can be found in the Care Plan section) Acute Rehab PT Goals  Patient Stated Goal: get home to her dog PT Goal Formulation: With patient Time For Goal Achievement: 03/11/15 Potential to Achieve Goals: Fair    Frequency Min 3X/week   Barriers to discharge Decreased caregiver support      Co-evaluation               End of Session Equipment Utilized During Treatment: Gait belt Activity Tolerance: No increased pain;Patient limited by fatigue Patient left: in chair;with call bell/phone within reach;with chair alarm set Nurse Communication:  Mobility status         Time: 1010-1029 PT Time Calculation (min) (ACUTE ONLY): 19 min   Charges:   PT Evaluation $Initial PT Evaluation Tier I: 1 Procedure     PT G Codes:        Philomena Doheny 02/25/2015, 10:34 AM [phone]

## 2015-02-25 NOTE — Progress Notes (Signed)
CSW continuing to follow.   CSW followed up with pt at bedside to provide SNF bed offers.   CSW discussed SNF bed offers with pt. Pt reports that her daughter-in-law will be at hospital this evening and pt plans to discuss options with her.   CSW discussed importance of choosing facility in order to notify chosen facility for pt [facility] insurance authorization to be initiated. Pt expressed understanding.   CSW to follow up with pt regarding decision for SNF.   CSW to continue to follow to provide support and assist with pt disposition planning.  Alison Murray, MSW, LCSW Clinical Social Work [place] coverage [phone]

## 2015-02-26 LAB — BASIC METABOLIC PANEL
ANION GAP: 7 (ref 5–15)
BUN: 8 mg/dL (ref 6–20)
CO2: 25 mmol/L (ref 22–32)
Calcium: 8.1 mg/dL — ABNORMAL LOW (ref 8.9–10.3)
Chloride: 99 mmol/L — ABNORMAL LOW (ref 101–111)
Creatinine, Ser: 0.62 mg/dL (ref 0.44–1.00)
GFR calc Af Amer: >60 mL/min (ref >60)
GFR calc non Af Amer: >60 mL/min (ref >60)
GLUCOSE: 130 mg/dL — AB (ref 65–99)
POTASSIUM: 3 mmol/L — AB (ref 3.5–5.1)
Sodium: 131 mmol/L — ABNORMAL LOW (ref 135–145)

## 2015-02-26 MED ORDER — POTASSIUM CHLORIDE CRYS ER 20 MEQ PO TBCR
40.0000 meq | EXTENDED_RELEASE_TABLET | Freq: Two times a day (BID) | ORAL | Status: AC
  Administered 2015-02-26 (×2): 40 meq via ORAL
  Filled 2015-02-26 (×2): qty 2

## 2015-02-26 MED ORDER — MESALAMINE 1.2 G PO TBEC
4.8000 g | DELAYED_RELEASE_TABLET | Freq: Every day | ORAL | Status: DC
  Administered 2015-02-27 – 2015-03-06 (×8): 4.8 g via ORAL
  Filled 2015-02-26 (×10): qty 4

## 2015-02-26 MED ORDER — VANCOMYCIN HCL 500 MG IV SOLR
500.0000 mg | Freq: Four times a day (QID) | Status: DC
  Administered 2015-02-28 – 2015-03-01 (×4): 500 mg via RECTAL
  Filled 2015-02-26 (×16): qty 500

## 2015-02-26 MED ORDER — POTASSIUM CHLORIDE CRYS ER 20 MEQ PO TBCR
40.0000 meq | EXTENDED_RELEASE_TABLET | Freq: Once | ORAL | Status: AC
  Administered 2015-02-26: 40 meq via ORAL
  Filled 2015-02-26: qty 2

## 2015-02-26 MED ORDER — ONDANSETRON HCL 4 MG/2ML IJ SOLN
4.0000 mg | INTRAMUSCULAR | Status: DC | PRN
  Administered 2015-03-01: 4 mg via INTRAVENOUS
  Filled 2015-02-26: qty 2

## 2015-02-26 MED ORDER — SIMETHICONE 80 MG PO CHEW
80.0000 mg | CHEWABLE_TABLET | Freq: Once | ORAL | Status: AC
  Administered 2015-02-26: 80 mg via ORAL
  Filled 2015-02-26: qty 1

## 2015-02-26 MED ORDER — DEXTROSE-NACL 5-0.9 % IV SOLN
INTRAVENOUS | Status: DC
  Administered 2015-02-26 – 2015-03-02 (×5): via INTRAVENOUS

## 2015-02-26 NOTE — Care Management Note (Signed)
Case Management Note  Patient Details  Name: KOURTNI STINEMAN MRN: [id number] Date of Birth: Jul 26, 1927  Subjective/Objective:             Admitted with c.diff diarrhea       Action/Plan: Discharge planning per CSW  Expected Discharge Date:   (unknown)               Expected Discharge Plan:  [place]  In-House Referral:  Clinical Social Work  Discharge planning Services  CM Consult  Post Acute Care Choice:  NA Choice offered to:  NA  DME Arranged:  N/A DME Agency:  NA  HH Arranged:  NA HH Agency:  NA  Status of Service:  Completed, signed off  Medicare Important Message Given:  Yes-second notification given Date Medicare IM Given:    Medicare IM give by:    Date Additional Medicare IM Given:    Additional Medicare Important Message give by:     If discussed at [place] of Stay Meetings, dates discussed:    Additional Comments:  Guadalupe Maple, RN 02/26/2015, 10:16 AM

## 2015-02-26 NOTE — Progress Notes (Signed)
Physical Therapy Treatment Patient Details Name: Stacy Mcguire MRN: [id number] DOB: 1928-02-21 Today's Date: 02/26/2015    History of Present Illness 81 y.o. female admitted with diarrhea. Dx of c diff, sepsis, ARF.     PT Comments    Progressing with mobility.   Follow Up Recommendations  SNF     Equipment Recommendations       Recommendations for Other Services       Precautions / Restrictions Precautions Precautions: Fall Restrictions Weight Bearing Restrictions: No    Mobility  Bed Mobility Overal bed mobility: Modified Independent             General bed mobility comments: HOB up 45*, used rail  Transfers Overall transfer level: Needs assistance Equipment used: Rolling walker (2 wheeled) Transfers: Sit to/from Stand Sit to Stand: Min guard         General transfer comment: close guard for safety. VCs safety, hand placement  Ambulation/Gait Ambulation/Gait assistance: Min guard Ambulation Distance (Feet): 55 Feet (55'x1, 15'x1) Assistive device: Rolling walker (2 wheeled) Gait Pattern/deviations: Step-through pattern;Decreased stride length     General Gait Details: close guard for safety. VCs safety.    Stairs            Wheelchair Mobility    Modified Rankin (Stroke Patients Only)       Balance                                    Cognition Arousal/Alertness: Awake/alert Behavior During Therapy: WFL for tasks assessed/performed Overall Cognitive Status: Within Functional Limits for tasks assessed                      Exercises General Exercises - Lower Extremity Straight Leg Raises: AROM;Both;15 reps;Seated Hip Flexion/Marching: AROM;Both;5 reps;Seated    General Comments        Pertinent Vitals/Pain Pain Assessment: No/denies pain    Home Living                      Prior Function            PT Goals (current goals can now be found in the care plan section) Progress towards PT  goals: Progressing toward goals    Frequency  Min 3X/week    PT Plan Current plan remains appropriate    Co-evaluation             End of Session   Activity Tolerance: Patient tolerated treatment well Patient left: in chair;with call bell/phone within reach     Time: 1004-1029 PT Time Calculation (min) (ACUTE ONLY): 25 min  Charges:  $Gait Training: 8-22 mins $Therapeutic Activity: 8-22 mins                    G Codes:      Weston Anna, MPT Pager: [phone]

## 2015-02-26 NOTE — Clinical Social Work Note (Signed)
CSW spoke to pt's daughter-in-law regarding bed offers.  The family selected [facility].  CSW informed [facility] staff that family accepted their offer.  CSW sent PT and progress notes to facility to begin [facility].  CSW will continue to follow and assist with d/c planning needs.  [place], [place]

## 2015-02-26 NOTE — Consult Note (Signed)
[facility] Consult Note  Referring Provider: No ref. provider found Primary Care Physician:  Elton Sin, FNP Primary Gastroenterologist:  Dr.  Laurel Dimmer Complaint: Diarrhea HPI: Stacy Mcguire is an 81 y.o. white female  admitted on September 26 with weakness and diarrhea. She was found to have significant colitis from the transverse colon to the rectum on CT and a positive C. difficile toxin. She was started on IV Flagyl vancomycin enemas and Flagyl. She had been on several antibodies recently for UTIs prior to this.  She has a history of ulcerative colitis since at least 1997 but it is then inactive by colonoscopies in 2010 and 2005. She saw me for diarrhea in August of this year as well as some weight loss and underwent sigmoidoscopy on August 9 which showed mild to moderate rectosigmoiditis consistent with ulcerative colitis and was started on Lialda. She quit taking it sitting thereafter but did not tell me due to nausea. I saw her back in the office on August 29 and obtain C. difficile toxin tissue transglutaminase and H. pylori antibody which were negative. We obtained an abdominal CT scan at that point which did not show any active colitis. It was after that point that she was seen by other physicians and treated with antibodies for UTI.  Currently she states that her diarrhea has improved with formed stools for the last 3 days. 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 She has only mild abdominal cramping and is eating well. Her white blood cell count has fallen from 30,000-13,000.  Her first CT scan on September 2 showed a possible suspicious 2 cm lesion in the left lobe of the liver and an MRI was recommended. However on the September 26 study this was not apparent. I reviewed the scans with the radiologist, Dr. Liam Rogers who feels that this lesion is very low likelihood of neoplasm and is most likely focal fatty liver. He recommended MRI in 6 months if  clinically suspicious.  Past Medical History  Diagnosis Date  . Arthritis   . Anemia, unspecified   . Depression   . GERD (gastroesophageal reflux disease)   . Diverticulosis 2010    Colonoscopy  . Internal hemorrhoids 2010    Colonoscopy  . Hiatal hernia 2010    EGD   . Ulcerative (chronic) proctitis 2005    Colonoscopy   . Osteoarthritis   . Migraine   . Kidney disease, chronic, stage III (moderate, EGFR 30-59 ml/min)   . Shingles   . Hot flashes   . UTI (urinary tract infection)     Past Surgical History  Procedure Laterality Date  . Abdominal hysterectomy    . Knee surgery    . Mastoidectomy    . Tonsillectomy    . Nose surgery      Medications Prior to Admission  Medication Sig Dispense Refill  . acetaminophen (TYLENOL) 325 MG tablet Take 2 tablets (650 mg total) by mouth every 6 (six) hours as needed for mild pain (or Fever >/= 101).    . diazepam (VALIUM) 2 MG tablet Take 2 mg by mouth at bedtime as needed for sedation.     . ENSURE (ENSURE) Take 1 Can by mouth daily. Once or twice a week    . hydrocortisone cream 0.5 % Apply 1 application topically 2 (two) times daily as needed for itching.    . Loperamide HCl (ANTI-DIARRHEAL PO) Take 2 tablets by mouth daily as needed (diarrhea).    Marland Kitchen  omeprazole (PRILOSEC) 20 MG capsule Take 20 mg by mouth daily.    . ondansetron (ZOFRAN) 4 MG tablet Take 1 tablet (4 mg total) by mouth daily as needed for nausea. 30 tablet 1  . Probiotic Product (ALIGN PO) Take 1 capsule by mouth daily.    . psyllium (METAMUCIL) 58.6 % powder Take 1 packet by mouth 2 (two) times daily. Mix with 8oz of water    . sertraline (ZOLOFT) 50 MG tablet Take 50 mg by mouth daily.   1  . traMADol (ULTRAM) 50 MG tablet Take 1 tablet (50 mg total) by mouth every 4 (four) hours as needed. for pain 30 tablet 0  . Vitamin D, Ergocalciferol, (DRISDOL) 50000 UNITS CAPS capsule Take 50,000 Units by mouth daily.      Allergies:  Allergies  Allergen Reactions   . Neosporin Af [Miconazole] Itching    Family History  Problem Relation Age of Onset  . Breast cancer Mother   . Colon cancer Neg Hx   . Breast cancer Sister 54  . Stroke Mother 68  . Diabetes Son 34  . Prostate cancer Son     Social History:  reports that she has never smoked. She has never used smokeless tobacco. She reports that she does not drink alcohol or use illicit drugs.  Review of Systems: negative except as above   Blood pressure 150/72, pulse 87, temperature 97.9 F (36.6 C), temperature source Oral, resp. rate 19, height '5\' 4"'  (1.626 m), weight 55 kg (121 lb 4.1 oz), SpO2 97 %. Head: Normocephalic, without obvious abnormality, atraumatic Neck: no adenopathy, no carotid bruit, no JVD, supple, symmetrical, trachea midline and thyroid not enlarged, symmetric, no tenderness/mass/nodules Resp: clear to auscultation bilaterally Cardio: regular rate and rhythm, S1, S2 normal, no murmur, click, rub or gallop GI: Abdomen soft nondistended with normoactive bowel sounds. No hepatomegaly masses or guarding. Extremities: extremities normal, atraumatic, no cyanosis or edema  Results for orders placed or performed during the hospital encounter of 02/22/15 (from the past 48 hour(s))  Basic metabolic panel     Status: Abnormal   Collection Time: 02/25/15  9:40 AM  Result Value Ref Range   Sodium 131 (L) 135 - 145 mmol/L   Potassium 2.5 (LL) 3.5 - 5.1 mmol/L    Comment: CRITICAL RESULT CALLED TO, READ BACK BY AND VERIFIED WITH: BLOCK,D @ 1022 ON [id number] BY POTEAT,S    Chloride 102 101 - 111 mmol/L   CO2 22 22 - 32 mmol/L   Glucose, Bld 130 (H) 65 - 99 mg/dL   BUN 11 6 - 20 mg/dL   Creatinine, Ser 0.67 0.44 - 1.00 mg/dL   Calcium 8.2 (L) 8.9 - 10.3 mg/dL   GFR calc non Af Amer >60 >60 mL/min   GFR calc Af Amer >60 >60 mL/min    Comment: (NOTE) The eGFR has been calculated using the CKD EPI equation. This calculation has not been validated in all clinical situations. eGFR's  persistently <60 mL/min signify possible Chronic Kidney Disease.    Anion gap 7 5 - 15  CBC     Status: Abnormal   Collection Time: 02/25/15  9:40 AM  Result Value Ref Range   WBC 13.1 (H) 4.0 - 10.5 K/uL   RBC 3.79 (L) 3.87 - 5.11 MIL/uL   Hemoglobin 10.5 (L) 12.0 - 15.0 g/dL   HCT 31.0 (L) 36.0 - 46.0 %   MCV 81.8 78.0 - 100.0 fL   MCH 27.7 26.0 - 34.0 pg  MCHC 33.9 30.0 - 36.0 g/dL   RDW 14.7 11.5 - 15.5 %   Platelets 442 (H) 150 - 400 K/uL  Basic metabolic panel     Status: Abnormal   Collection Time: 02/26/15  6:10 AM  Result Value Ref Range   Sodium 131 (L) 135 - 145 mmol/L   Potassium 3.0 (L) 3.5 - 5.1 mmol/L   Chloride 99 (L) 101 - 111 mmol/L   CO2 25 22 - 32 mmol/L   Glucose, Bld 130 (H) 65 - 99 mg/dL   BUN 8 6 - 20 mg/dL   Creatinine, Ser 0.62 0.44 - 1.00 mg/dL   Calcium 8.1 (L) 8.9 - 10.3 mg/dL   GFR calc non Af Amer >60 >60 mL/min   GFR calc Af Amer >60 >60 mL/min    Comment: (NOTE) The eGFR has been calculated using the CKD EPI equation. This calculation has not been validated in all clinical situations. eGFR's persistently <60 mL/min signify possible Chronic Kidney Disease.    Anion gap 7 5 - 15   No results found.  Assessment: Left sided Ulcerative colitis most recent study sigmoidoscopy in September 2. C. difficile colitis associated with recent antibody use. Plan:  Difficult to say what the portion of her current presentation is due to ulcerative versus C. difficile colitis but suspect the majority of it is from the latter. Current therapy appears to be working by all objective measures. Would simply add back Lialda 4.8 g per day. We'll follow with you. HAYES,JOHN C 02/26/2015, 2:58 PM  Pager [phone] If no answer or after 5 PM call [phone]

## 2015-02-26 NOTE — Progress Notes (Signed)
[facility] PROGRESS NOTE Interim History: 81 year old with past medical history of ulcerative colitis on a PPI and frequent UTIs who completed a course of antibiotic, admitted on 02/22/2015 with chief complaint of abdominal pain and profuse diarrhea, on admission she was found to have a white count of 35 with a creatinine of 1.1.   Assessment/Plan: Sepsis due to Refractory C. difficile colitis - C. difficile colitis positive on 02/22/2015, she was started empirically on vancomycin on 02/22/2015. - She continues to have watery stool with abdominal pain.  -Started on  flagyl IV 9-29. Vancomycin enema 9-29 - Continue clear liquid diet so friend when necessary for nausea. -still with nausea, diarrhea. Will consult GI. Patient also with history of UC.   Acute renal failure: - Likely due to hypovolemia due to diarrhea. - It resolve with IV fluid hydration.   Acute hyponatremia - Prerenal in etiology improving with IV fluid hydration.  Hypokalemia: Likely the diarrhea, replete orally. 40 meq Kcl  BID time 2.   Metabolic acidosis: Improved with Bicarb Gtt/ Bicarb at 25. Will change fluids to NS.   Thrombocytosis: - Due to infectious etiology.   Family Communication: care discussed with patient.  Disposition Plan: will need SNF   Consultants:  GI  Procedures:  CT abd and pelvis  Antibiotics:  Oral vanc  HPI/Subjective: Still having diarrhea, had 4 BM yesterday. 3 so far today. Still complaining of nausea.   Objective: Filed Vitals:   02/25/15 1612 02/25/15 2055 02/26/15 0523 02/26/15 0847  BP: 152/71 154/58 154/79 127/63  Pulse: 86 66 96 93  Temp: 98.5 F (36.9 C) 98.4 F (36.9 C) 97.8 F (36.6 C) 97.3 F (36.3 C)  TempSrc: Oral Oral Oral Oral  Resp: 16 20 18 24   Height:      Weight:   55 kg (121 lb 4.1 oz)   SpO2: 97% 94% 95% 97%    Intake/Output Summary (Last 24 hours) at 02/26/15 1258 Last data filed at 02/26/15 0600  Gross per 24 hour    Intake 1911.25 ml  Output      0 ml  Net 1911.25 ml   Filed Weights   02/24/15 0454 02/25/15 0503 02/26/15 0523  Weight: 54.8 kg (120 lb 13 oz) 54.5 kg (120 lb 2.4 oz) 55 kg (121 lb 4.1 oz)    Exam:  General: Alert, awake HEENT: No bruits, no goiter.  Heart: Regular rate and rhythm. Lungs: Good air movement, clear Abdomen: Soft, diffuse tenderness nondistended, positive bowel sounds.  Neuro: Grossly intact, nonfocal.   Data Reviewed: Basic Metabolic Panel:  Recent Labs Lab 02/22/15 1036 02/22/15 1830 02/23/15 0615 02/24/15 0545 02/25/15 0940 02/26/15 0610  NA 130*  --  130* 132* 131* 131*  K 3.6  --  3.5 3.2* 2.5* 3.0*  CL 97*  --  100* 103 102 99*  CO2 23  --  20* 19* 22 25  GLUCOSE 160*  --  109* 79 130* 130*  BUN 28*  --  21* 18 11 8   CREATININE 1.19* 1.01* 0.91 0.83 0.67 0.62  CALCIUM 9.1  --  8.6* 8.3* 8.2* 8.1*  MG  --   --  1.7  --   --   --    Liver Function Tests:  Recent Labs Lab 02/22/15 1036  AST 33  ALT 37  ALKPHOS 83  BILITOT 0.7  PROT 6.3*  ALBUMIN 2.8*    Recent Labs Lab 02/22/15 1036  LIPASE <10*   No results for input(s): AMMONIA  in the last 168 hours. CBC:  Recent Labs Lab 02/22/15 1036 02/22/15 1830 02/23/15 0615 02/24/15 0545 02/25/15 0940  WBC 35.3* 29.8* 29.4* 19.0* 13.1*  NEUTROABS 31.4*  --   --   --   --   HGB 11.4* 10.9* 10.8* 9.7* 10.5*  HCT 33.6* 32.5* 32.2* 29.3* 31.0*  MCV 81.2 82.9 82.1 82.1 81.8  PLT 449* 429* 418* 401* 442*   Cardiac Enzymes: No results for input(s): CKTOTAL, CKMB, CKMBINDEX, TROPONINI in the last 168 hours. BNP (last 3 results) No results for input(s): BNP in the last 8760 hours.  ProBNP (last 3 results) No results for input(s): PROBNP in the last 8760 hours.  CBG: No results for input(s): GLUCAP in the last 168 hours.  Recent Results (from the past 240 hour(s))  C difficile quick scan w PCR reflex     Status: Abnormal   Collection Time: 02/22/15  3:22 PM  Result Value Ref  Range Status   C Diff antigen POSITIVE (A) NEGATIVE Final   C Diff toxin POSITIVE (A) NEGATIVE Final   C Diff interpretation Positive for toxigenic C. difficile  Final    Comment: CRITICAL RESULT CALLED TO, READ BACK BY AND VERIFIED WITH: C HALL AT 1618 ON 09.26.2016 BY NBROOKS   Culture, blood (x 2)     Status: None (Preliminary result)   Collection Time: 02/22/15  6:30 PM  Result Value Ref Range Status   Specimen Description BLOOD RIGHT ARM  Final   Special Requests BOTTLES DRAWN AEROBIC AND ANAEROBIC 5CC  Final   Culture   Final    NO GROWTH 3 DAYS Performed at [facility]    Report Status PENDING  Incomplete  Culture, blood (x 2)     Status: None (Preliminary result)   Collection Time: 02/22/15  6:41 PM  Result Value Ref Range Status   Specimen Description BLOOD RIGHT HAND  Final   Special Requests IN PEDIATRIC BOTTLE 3CC  Final   Culture   Final    NO GROWTH 3 DAYS Performed at [facility]    Report Status PENDING  Incomplete     Studies: No results found.  Scheduled Meds: . bifidobacterium infantis  1 capsule Oral Daily  . enoxaparin (LOVENOX) injection  40 mg Subcutaneous Q24H  . feeding supplement  1 Container Oral q morning - 10a  . feeding supplement (ENSURE ENLIVE)  237 mL Oral BID BM  . metronidazole  500 mg Intravenous Q8H  . potassium chloride  40 mEq Oral BID  . saccharomyces boulardii  250 mg Oral BID  . sertraline  50 mg Oral Daily  . sodium chloride  3 mL Intravenous Q12H  . vancomycin  500 mg Oral 4 times per day  . vancomycin (VANCOCIN) rectal ENEMA  500 mg Rectal Q6H   Continuous Infusions: . dextrose 5 % and 0.9% NaCl 75 mL/hr at 02/26/15 1238    Time Spent: 25 min   Regalado, Belkys A  [facility] Pager [phone]  If 7PM-7AM, please contact night-coverage at [email or web address], password [facility] 02/26/2015, 12:58 PM  LOS: 4 days

## 2015-02-27 DIAGNOSIS — E46 Unspecified protein-calorie malnutrition: Secondary | ICD-10-CM

## 2015-02-27 LAB — CULTURE, BLOOD (ROUTINE X 2)
CULTURE: NO GROWTH
CULTURE: NO GROWTH

## 2015-02-27 LAB — BASIC METABOLIC PANEL
ANION GAP: 6 (ref 5–15)
BUN: 9 mg/dL (ref 6–20)
CHLORIDE: 102 mmol/L (ref 101–111)
CO2: 25 mmol/L (ref 22–32)
Calcium: 8.4 mg/dL — ABNORMAL LOW (ref 8.9–10.3)
Creatinine, Ser: 0.79 mg/dL (ref 0.44–1.00)
Glucose, Bld: 126 mg/dL — ABNORMAL HIGH (ref 65–99)
Potassium: 4.5 mmol/L (ref 3.5–5.1)
SODIUM: 133 mmol/L — AB (ref 135–145)

## 2015-02-27 LAB — CBC
HEMATOCRIT: 31.1 % — AB (ref 36.0–46.0)
HEMOGLOBIN: 10.3 g/dL — AB (ref 12.0–15.0)
MCH: 27 pg (ref 26.0–34.0)
MCHC: 33.1 g/dL (ref 30.0–36.0)
MCV: 81.4 fL (ref 78.0–100.0)
Platelets: 414 10*3/uL — ABNORMAL HIGH (ref 150–400)
RBC: 3.82 MIL/uL — AB (ref 3.87–5.11)
RDW: 14.8 % (ref 11.5–15.5)
WBC: 9.7 10*3/uL (ref 4.0–10.5)

## 2015-02-27 NOTE — Progress Notes (Signed)
Patient ID: Rosemarie Ax, female   DOB: 09-20-1927, 81 y.o.   MRN: [id number] [facility] Progress Note  LAGRETTA LOSEKE 81 y.o. 1928-05-03   Subjective: Reports increasing form in stool but still has looseness. Reports decrease in frequency of stool. Denies abdominal pain or nausea. Tolerating liquids.  Objective: Vital signs in last 24 hours: Filed Vitals:   02/27/15 0513  BP: 140/71  Pulse: 86  Temp: 97.7 F (36.5 C)  Resp: 18    Physical Exam: Gen: lethargic, elderly, thin, no acute distress CV: RRR Chest: CTA B Abd: diffusely tender (greatest in LLQ) with guarding, soft, nondistended, +BS Ext: no edema Skin: no rash  Lab Results:  Recent Labs  02/26/15 0610 02/27/15 0552  NA 131* 133*  K 3.0* 4.5  CL 99* 102  CO2 25 25  GLUCOSE 130* 126*  BUN 8 9  CREATININE 0.62 0.79  CALCIUM 8.1* 8.4*   No results for input(s): AST, ALT, ALKPHOS, BILITOT, PROT, ALBUMIN in the last 72 hours.  Recent Labs  02/25/15 0940 02/27/15 0552  WBC 13.1* 9.7  HGB 10.5* 10.3*  HCT 31.0* 31.1*  MCV 81.8 81.4  PLT 442* 414*   No results for input(s): LABPROT, INR in the last 72 hours.    Assessment/Plan: C. Diff colitis (in the setting of ulcerative colitis) improving on IV Flagyl and PO Vanco. Also on Align. Continue Lialda for distal UC. Would continue IV Flagyl today but if symptoms continue to improve then would change to PO Flagyl in next 1-2 days. Continue PO Vanco and probiotics. Continue clear liquid diet. Supportive care. Will follow.   [place] C. 02/27/2015, 10:08 AM  Pager [phone]  If no answer or after 5 PM call [phone]

## 2015-02-27 NOTE — Progress Notes (Signed)
[facility] PROGRESS NOTE  Stacy Mcguire WUJ:[id number] DOB: [birth year] DOA: 02/22/2015 PCP: Elton Sin, FNP  Brief narrative 81 year old female with ulcerative colitis, frequent UTIs with recent antibiotic use was admitted on 9/26 with abdominal pain and profuse diarrhea with sepsis with significant WBC of 35K secondary to C. difficile colitis.   Assessment/Plan: Sepsis secondary to refractory C. difficile colitis On empiric IV Flagyl and vancomycin and enema and continued watery diarrhea and abdominal pain. Symptoms improving. Will advance diet to full liquid. stools are more formed today. Continue probiotics. Continue IV hydration. Supportive care with pain medications and antiemetics. Appreciate GI recommendations.  Acute kidney injury Secondary to dehydration with severe diarrhea. Resolved with IV fluids  Hyponatremia and hypokalemia Prerenal secondary to dehydration. Now resolved.  Metabolic acidosis Secondary to severe dehydration with diarrhea. Required bicarbonate drip on admission. Now resolved.  Thrombocytosis Reactive secondary to underlying infection. Now resolved.  Ulcerative colitis No signs of flareup. GI recommends to resume mesalamine.   Protein calorie malnutrition continue supplements   Diet: Full liquid EVD prophylaxis: Subcutaneous Lovenox   Code Status: Full Code Family Communication: None at bedside Disposition Plan: Seen by physical therapy and given ongoing weakness recommend skilled nursing facility . Possibly on 10/3   Consultants:  [facility]  Procedures:  CT abdomen and pelvis with contrast  Antibiotics:  IV Flagyl and vancomycin  HPI/Subjective: Patient reports having one watery bowel movement this morning. Frequency of diarrhea has improved. Denies nausea or vomiting.  Objective: Filed Vitals:   02/27/15 0513  BP: 140/71  Pulse: 86  Temp: 97.7 F (36.5 C)  Resp: 18    Intake/Output Summary (Last 24 hours)  at 02/27/15 1015 Last data filed at 02/27/15 0600  Gross per 24 hour  Intake   1024 ml  Output      0 ml  Net   1024 ml   Filed Weights   02/25/15 0503 02/26/15 0523 02/27/15 0513  Weight: 54.5 kg (120 lb 2.4 oz) 55 kg (121 lb 4.1 oz) 55.2 kg (121 lb 11.1 oz)    Exam:   General: Elderly female not in distress, appears fatigued  HEENT: No pallor, moist oral mucosa, supple neck  Chest: Clear to auscultation bilaterally, no added sounds  CVS: Normal S1 and S2, no murmurs rub or gallop  GI: Soft, nondistended, mild tenderness over left mid and lower quadrant, bowel sounds present  Musculoskeletal : Warm, no edema   Data Reviewed: Basic Metabolic Panel:  Recent Labs Lab 02/23/15 0615 02/24/15 0545 02/25/15 0940 02/26/15 0610 02/27/15 0552  NA 130* 132* 131* 131* 133*  K 3.5 3.2* 2.5* 3.0* 4.5  CL 100* 103 102 99* 102  CO2 20* 19* 22 25 25   GLUCOSE 109* 79 130* 130* 126*  BUN 21* 18 11 8 9   CREATININE 0.91 0.83 0.67 0.62 0.79  CALCIUM 8.6* 8.3* 8.2* 8.1* 8.4*  MG 1.7  --   --   --   --    Liver Function Tests:  Recent Labs Lab 02/22/15 1036  AST 33  ALT 37  ALKPHOS 83  BILITOT 0.7  PROT 6.3*  ALBUMIN 2.8*    Recent Labs Lab 02/22/15 1036  LIPASE <10*   No results for input(s): AMMONIA in the last 168 hours. CBC:  Recent Labs Lab 02/22/15 1036 02/22/15 1830 02/23/15 0615 02/24/15 0545 02/25/15 0940 02/27/15 0552  WBC 35.3* 29.8* 29.4* 19.0* 13.1* 9.7  NEUTROABS 31.4*  --   --   --   --   --  HGB 11.4* 10.9* 10.8* 9.7* 10.5* 10.3*  HCT 33.6* 32.5* 32.2* 29.3* 31.0* 31.1*  MCV 81.2 82.9 82.1 82.1 81.8 81.4  PLT 449* 429* 418* 401* 442* 414*   Cardiac Enzymes: No results for input(s): CKTOTAL, CKMB, CKMBINDEX, TROPONINI in the last 168 hours. BNP (last 3 results) No results for input(s): BNP in the last 8760 hours.  ProBNP (last 3 results) No results for input(s): PROBNP in the last 8760 hours.  CBG: No results for input(s): GLUCAP  in the last 168 hours.  Recent Results (from the past 240 hour(s))  C difficile quick scan w PCR reflex     Status: Abnormal   Collection Time: 02/22/15  3:22 PM  Result Value Ref Range Status   C Diff antigen POSITIVE (A) NEGATIVE Final   C Diff toxin POSITIVE (A) NEGATIVE Final   C Diff interpretation Positive for toxigenic C. difficile  Final    Comment: CRITICAL RESULT CALLED TO, READ BACK BY AND VERIFIED WITH: C HALL AT 1618 ON 09.26.2016 BY NBROOKS   Culture, blood (x 2)     Status: None (Preliminary result)   Collection Time: 02/22/15  6:30 PM  Result Value Ref Range Status   Specimen Description BLOOD RIGHT ARM  Final   Special Requests BOTTLES DRAWN AEROBIC AND ANAEROBIC 5CC  Final   Culture   Final    NO GROWTH 4 DAYS Performed at [facility]    Report Status PENDING  Incomplete  Culture, blood (x 2)     Status: None (Preliminary result)   Collection Time: 02/22/15  6:41 PM  Result Value Ref Range Status   Specimen Description BLOOD RIGHT HAND  Final   Special Requests IN PEDIATRIC BOTTLE 3CC  Final   Culture   Final    NO GROWTH 4 DAYS Performed at [facility]    Report Status PENDING  Incomplete     Studies: No results found.  Scheduled Meds: . bifidobacterium infantis  1 capsule Oral Daily  . enoxaparin (LOVENOX) injection  40 mg Subcutaneous Q24H  . feeding supplement  1 Container Oral q morning - 10a  . feeding supplement (ENSURE ENLIVE)  237 mL Oral BID BM  . mesalamine  4.8 g Oral Q breakfast  . metronidazole  500 mg Intravenous Q8H  . saccharomyces boulardii  250 mg Oral BID  . sertraline  50 mg Oral Daily  . sodium chloride  3 mL Intravenous Q12H  . vancomycin  500 mg Oral 4 times per day  . vancomycin (VANCOCIN) rectal ENEMA  500 mg Rectal 4 times per day   Continuous Infusions: . dextrose 5 % and 0.9% NaCl 75 mL/hr at 02/27/15 0600      Time spent: 25 minutes    Jayley Hustead, [place]  [facility] Pager [phone]. If  7PM-7AM, please contact night-coverage at [email or web address], password [facility] 02/27/2015, 10:15 AM  LOS: 5 days

## 2015-02-28 ENCOUNTER — Inpatient Hospital Stay (HOSPITAL_COMMUNITY): Payer: Medicare PPO

## 2015-02-28 DIAGNOSIS — E43 Unspecified severe protein-calorie malnutrition: Secondary | ICD-10-CM

## 2015-02-28 DIAGNOSIS — A09 Infectious gastroenteritis and colitis, unspecified: Secondary | ICD-10-CM

## 2015-02-28 NOTE — Progress Notes (Addendum)
[facility] PROGRESS NOTE  Stacy Mcguire OFB:[id number] DOB: 1927-10-12 DOA: 02/22/2015 PCP: Stacy Sin, FNP  Brief narrative 81 year old female with ulcerative colitis, frequent UTIs with recent antibiotic use was admitted on 9/26 with abdominal pain and profuse diarrhea with sepsis with significant WBC of 35K secondary to C. difficile colitis.   Assessment/Plan: Sepsis secondary to refractory C. difficile colitis On empiric IV Flagyl and oral vancomycin + vancomycin  Enema. Reports loose  diarrhea and abdominal pain. Had 4 episodes last night. Continue probiotics. Pt refusing vanco enema and i explained to her why she needs it. She agrees to take the enemas. Continue IV hydration. Supportive care with pain medications and antiemetics. Appreciate GI recommendations.  Acute kidney injury Secondary to dehydration with severe diarrhea. Resolved with IV fluids  Hyponatremia and hypokalemia Prerenal secondary to dehydration. Now resolved.  Metabolic acidosis Secondary to severe dehydration with diarrhea. Required bicarbonate drip on admission. Now resolved.  Thrombocytosis Reactive secondary to underlying infection. Now resolved.  Ulcerative colitis No signs of flareup. GI recommends to resume mesalamine.   Protein calorie malnutrition, severe continue supplements   Diet: Full liquid DVT  prophylaxis: Subcutaneous Lovenox   Code Status: Full Code Family Communication: None at bedside Disposition Plan: Seen by physical therapy and given ongoing weakness recommend skilled nursing facility . Possibly on 10/3 or 10/4 if diarrhea improves.   Consultants:  [facility]  Procedures:  CT abdomen and pelvis with contrast  Antibiotics:  IV Flagyl and vancomycin 9/26  HPI/Subjective: Patient reports that she had 4 episodes of diarrhea last night and 2 episodes already this am.   Objective: Filed Vitals:   02/28/15 0549  BP: 151/81  Pulse: 86  Temp: 98 F  (36.7 C)  Resp: 18    Intake/Output Summary (Last 24 hours) at 02/28/15 1000 Last data filed at 02/27/15 2000  Gross per 24 hour  Intake     60 ml  Output      0 ml  Net     60 ml   Filed Weights   02/26/15 0523 02/27/15 0513 02/28/15 0549  Weight: 55 kg (121 lb 4.1 oz) 55.2 kg (121 lb 11.1 oz) 53.5 kg (117 lb 15.1 oz)    Exam:   General: Elderly female not in distress, appears fatigued  HEENT: No pallor, moist oral mucosa, supple neck  Chest: Clear to auscultation bilaterally, no added sounds  CVS: Normal S1 and S2, no murmurs rub or gallop  GI: Soft, nondistended, mild tenderness over left mid and lower quadrant, bowel sounds present  Musculoskeletal : Warm, no edema  Data Reviewed: Basic Metabolic Panel:  Recent Labs Lab 02/23/15 0615 02/24/15 0545 02/25/15 0940 02/26/15 0610 02/27/15 0552  NA 130* 132* 131* 131* 133*  K 3.5 3.2* 2.5* 3.0* 4.5  CL 100* 103 102 99* 102  CO2 20* 19* 22 25 25   GLUCOSE 109* 79 130* 130* 126*  BUN 21* 18 11 8 9   CREATININE 0.91 0.83 0.67 0.62 0.79  CALCIUM 8.6* 8.3* 8.2* 8.1* 8.4*  MG 1.7  --   --   --   --    Liver Function Tests:  Recent Labs Lab 02/22/15 1036  AST 33  ALT 37  ALKPHOS 83  BILITOT 0.7  PROT 6.3*  ALBUMIN 2.8*    Recent Labs Lab 02/22/15 1036  LIPASE <10*   No results for input(s): AMMONIA in the last 168 hours. CBC:  Recent Labs Lab 02/22/15 1036 02/22/15 1830 02/23/15 0615 02/24/15 0545  02/25/15 0940 02/27/15 0552  WBC 35.3* 29.8* 29.4* 19.0* 13.1* 9.7  NEUTROABS 31.4*  --   --   --   --   --   HGB 11.4* 10.9* 10.8* 9.7* 10.5* 10.3*  HCT 33.6* 32.5* 32.2* 29.3* 31.0* 31.1*  MCV 81.2 82.9 82.1 82.1 81.8 81.4  PLT 449* 429* 418* 401* 442* 414*   Cardiac Enzymes: No results for input(s): CKTOTAL, CKMB, CKMBINDEX, TROPONINI in the last 168 hours. BNP (last 3 results) No results for input(s): BNP in the last 8760 hours.  ProBNP (last 3 results) No results for input(s): PROBNP in  the last 8760 hours.  CBG: No results for input(s): GLUCAP in the last 168 hours.  Recent Results (from the past 240 hour(s))  C difficile quick scan w PCR reflex     Status: Abnormal   Collection Time: 02/22/15  3:22 PM  Result Value Ref Range Status   C Diff antigen POSITIVE (A) NEGATIVE Final   C Diff toxin POSITIVE (A) NEGATIVE Final   C Diff interpretation Positive for toxigenic C. difficile  Final    Comment: CRITICAL RESULT CALLED TO, READ BACK BY AND VERIFIED WITH: C HALL AT 1610 ON 09.26.2016 BY NBROOKS   Culture, blood (x 2)     Status: None   Collection Time: 02/22/15  6:30 PM  Result Value Ref Range Status   Specimen Description BLOOD RIGHT ARM  Final   Special Requests BOTTLES DRAWN AEROBIC AND ANAEROBIC 5CC  Final   Culture   Final    NO GROWTH 5 DAYS Performed at [facility]    Report Status 02/27/2015 FINAL  Final  Culture, blood (x 2)     Status: None   Collection Time: 02/22/15  6:41 PM  Result Value Ref Range Status   Specimen Description BLOOD RIGHT HAND  Final   Special Requests IN PEDIATRIC BOTTLE 3CC  Final   Culture   Final    NO GROWTH 5 DAYS Performed at [facility]    Report Status 02/27/2015 FINAL  Final     Studies: No results found.  Scheduled Meds: . bifidobacterium infantis  1 capsule Oral Daily  . enoxaparin (LOVENOX) injection  40 mg Subcutaneous Q24H  . feeding supplement  1 Container Oral q morning - 10a  . feeding supplement (ENSURE ENLIVE)  237 mL Oral BID BM  . mesalamine  4.8 g Oral Q breakfast  . metronidazole  500 mg Intravenous Q8H  . saccharomyces boulardii  250 mg Oral BID  . sertraline  50 mg Oral Daily  . sodium chloride  3 mL Intravenous Q12H  . vancomycin  500 mg Oral 4 times per day  . vancomycin (VANCOCIN) rectal ENEMA  500 mg Rectal 4 times per day   Continuous Infusions: . dextrose 5 % and 0.9% NaCl 75 mL/hr at 02/28/15 0554      Time spent: 25 minutes    Linet Brash, [place]  [facility] Pager [phone]. If 7PM-7AM, please contact night-coverage at [email or web address], password [email or web address] 02/28/2015, 10:00 AM  LOS: 6 days

## 2015-02-28 NOTE — Progress Notes (Addendum)
Patient ID: Rosemarie Ax, female   DOB: 07/22/1927, 81 y.o.   MRN: [id number] [facility] Progress Note  TALULLAH ABATE 81 y.o. 1928/02/27   Subjective: Diarrhea worse overnight with increased frequency and no form. Denies abdominal pain, nausea, or vomiting.  Objective: Vital signs in last 24 hours: Filed Vitals:   02/28/15 0549  BP: 151/81  Pulse: 86  Temp: 98 F (36.7 C)  Resp: 18    Physical Exam: Gen: elderly, frail, thin, lethargic, no acute distress CV: RRR Chest: CTA B Abd: diffuse tenderness with guarding, soft, nondistended, +BS Ext: no edema  Lab Results:  Recent Labs  02/26/15 0610 02/27/15 0552  NA 131* 133*  K 3.0* 4.5  CL 99* 102  CO2 25 25  GLUCOSE 130* 126*  BUN 8 9  CREATININE 0.62 0.79  CALCIUM 8.1* 8.4*   No results for input(s): AST, ALT, ALKPHOS, BILITOT, PROT, ALBUMIN in the last 72 hours.  Recent Labs  02/27/15 0552  WBC 9.7  HGB 10.3*  HCT 31.1*  MCV 81.4  PLT 414*   No results for input(s): LABPROT, INR in the last 72 hours.    Assessment/Plan: C. Diff colitis in setting of ulcerative colitis. Worsened diarrhea overnight after making progress day before. Abdominal pain likely due to C. Diff but will do a KUB to look for colonic dilation (toxic megacolon). Needs Vanco enemas (if no colonic dilation) Nurse reports patient has been refusing but I advised patient she needs the Vancomycin enemas and hopefully she can retain them. Continue IV Flagyl. Patient reports gagging on PO Vanco but would keep on the PO Vanco as well. Continue supportive care.   Sterling C. 02/28/2015, 10:17 AM  Pager [phone]  If no answer or after 5 PM call [phone]

## 2015-03-01 DIAGNOSIS — B377 Candidal sepsis: Secondary | ICD-10-CM

## 2015-03-01 DIAGNOSIS — R1110 Vomiting, unspecified: Secondary | ICD-10-CM

## 2015-03-01 DIAGNOSIS — A047 Enterocolitis due to Clostridium difficile: Secondary | ICD-10-CM

## 2015-03-01 LAB — CREATININE, SERUM: CREATININE: 0.69 mg/dL (ref 0.44–1.00)

## 2015-03-01 MED ORDER — FIDAXOMICIN 200 MG PO TABS
200.0000 mg | ORAL_TABLET | Freq: Two times a day (BID) | ORAL | Status: DC
  Administered 2015-03-01 – 2015-03-06 (×10): 200 mg via ORAL
  Filled 2015-03-01 (×11): qty 1

## 2015-03-01 NOTE — Consult Note (Signed)
[name]      Reason for Consult: c diff    Referring Physician: Dr. Floria Raveling. Aileen Fass  Principal Problem:   C. difficile colitis Active Problems:   Weakness generalized   Diarrhea   Sepsis ([place])   Acute renal failure ([place])   Acute hyponatremia   Thrombocytosis ([facility])   Metabolic acidosis   Hypokalemia   . bifidobacterium infantis  1 capsule Oral Daily  . enoxaparin (LOVENOX) injection  40 mg Subcutaneous Q24H  . feeding supplement  1 Container Oral q morning - 10a  . feeding supplement (ENSURE ENLIVE)  237 mL Oral BID BM  . mesalamine  4.8 g Oral Q breakfast  . metronidazole  500 mg Intravenous Q8H  . saccharomyces boulardii  250 mg Oral BID  . sertraline  50 mg Oral Daily  . sodium chloride  3 mL Intravenous Q12H  . vancomycin  500 mg Oral 4 times per day  . vancomycin (VANCOCIN) rectal ENEMA  500 mg Rectal 4 times per day    Recommendations: Will change to dificid  Will stop enemas, IV flagyl, oral vancomycin and Align  Assessment: She has C diff and initially seemed to respond to treatment but now worsening.  I suspect this is largely related to vomiting the vancomycin solution over the last 2 days so will try to streamline her medications with Dificid since pills seem to be easier for her to keep down.     Explained to the patient and daughter in law that fecal transplant procedures are not done inpatient.    Her last several months have been notable for decline, progressive weakness of unknown etiology, not sure if UC is contributing to this or if something else going on.   I reviewed history and discussed the treatment plan and basics of C diff with her daughter in law by phone, [name], [phone], per patients request, after the patient gave me permission to speak to her.    Personally reviewed previous hospitalization, micro, treatments to date.    Antibiotics: vanco po, enema, IV flagyl  HPI: Stacy Mcguire is  a 81 y.o. female with ulcerative colitis, progressive decline over several months, who presented 9/26 with abdominal pain, watery diarrhea and poor po.  She has not felt well for months and saw her PCP in June who diagnosed her with a UTI.  She was not having any urinary symptoms at the time, just malaise and abdominal pain but was treated with antibiotics.  The culture was apparently resistant and a second antibiotic was prescribed for 10 days.  She then saw another doctor and prescribed cipro, she is unclear of what the treatment was for. Over the last several days prior to admission had subjective fever, and over 10 loose watery stools and felt progressively weaker.  WBC was 35,000 and C diff positive.  Started on empiric therapy.  Down to about 8 loose stools but still watery after initially becoming more formed.  No blood.    Review of Systems: A comprehensive review of systems was negative except for: Constitutional: positive for anorexia  Past Medical History  Diagnosis Date  . Arthritis   . Anemia, unspecified   . Depression   . GERD (gastroesophageal reflux disease)   . Diverticulosis 2010    Colonoscopy  . Internal hemorrhoids 2010    Colonoscopy  . Hiatal hernia 2010    EGD   . Ulcerative (chronic) proctitis 2005    Colonoscopy   .  Osteoarthritis   . Migraine   . Kidney disease, chronic, stage III (moderate, EGFR 30-59 ml/min)   . Shingles   . Hot flashes   . UTI (urinary tract infection)     Social History  Substance Use Topics  . Smoking status: Never Smoker   . Smokeless tobacco: Never Used  . Alcohol Use: No    Family History  Problem Relation Age of Onset  . Breast cancer Mother   . Colon cancer Neg Hx   . Breast cancer Sister 39  . Stroke Mother 90  . Diabetes Son 63  . Prostate cancer Son    Allergies  Allergen Reactions  . Neosporin Af [Miconazole] Itching    OBJECTIVE: Blood pressure 156/84, pulse 89, temperature 97.5 F (36.4 C), temperature  source Oral, resp. rate 19, height '5\' 4"'  (1.626 m), weight 113 lb 1.5 oz (51.3 kg), SpO2 98 %. General: awake, alert, mild distress with diarrhea HEENT: anicteric Skin: no rashes Lungs: CTA B Cor: RRR Abdomen: soft, nt, +hyperactive bs Ext: no edema Neuro: non-focal   Microbiology: Recent Results (from the past 240 hour(s))  C difficile quick scan w PCR reflex     Status: Abnormal   Collection Time: 02/22/15  3:22 PM  Result Value Ref Range Status   C Diff antigen POSITIVE (A) NEGATIVE Final   C Diff toxin POSITIVE (A) NEGATIVE Final   C Diff interpretation Positive for toxigenic C. difficile  Final    Comment: CRITICAL RESULT CALLED TO, READ BACK BY AND VERIFIED WITH: C HALL AT 1618 ON 09.26.2016 BY NBROOKS   Culture, blood (x 2)     Status: None   Collection Time: 02/22/15  6:30 PM  Result Value Ref Range Status   Specimen Description BLOOD RIGHT ARM  Final   Special Requests BOTTLES DRAWN AEROBIC AND ANAEROBIC 5CC  Final   Culture   Final    NO GROWTH 5 DAYS Performed at [facility]    Report Status 02/27/2015 FINAL  Final  Culture, blood (x 2)     Status: None   Collection Time: 02/22/15  6:41 PM  Result Value Ref Range Status   Specimen Description BLOOD RIGHT HAND  Final   Special Requests IN PEDIATRIC BOTTLE 3CC  Final   Culture   Final    NO GROWTH 5 DAYS Performed at [facility]    Report Status 02/27/2015 FINAL  Final    Scharlene Gloss, [facility] [email or web address] [phone] pager  [phone] cell 03/01/2015, 3:37 PM

## 2015-03-01 NOTE — Progress Notes (Signed)
PT Cancellation Note  Patient Details Name: Stacy Mcguire MRN: [id number] DOB: March 31, 1928   Cancelled Treatment:    Reason Eval/Treat Not Completed: Fatigue/lethargy limiting ability to participate. Pt declined participation on today.    Weston Anna, MPT Pager: [phone]

## 2015-03-01 NOTE — Progress Notes (Signed)
Subjective: Diarrhea persists. She feels very weak and deconditioned.  Objective: Vital signs in last 24 hours: Temp:  [97.5 F (36.4 C)-97.7 F (36.5 C)] 97.5 F (36.4 C) (10/03 0558) Pulse Rate:  [84-89] 89 (10/03 0558) Resp:  [18-20] 19 (10/03 0558) BP: (156-157)/(68-84) 156/84 mmHg (10/03 0558) SpO2:  [97 %-98 %] 98 % (10/03 0558) Weight:  [51.3 kg (113 lb 1.5 oz)] 51.3 kg (113 lb 1.5 oz) (10/03 0558) Weight change: -2.2 kg (-4 lb 13.6 oz) Last BM Date: 02/28/15  PE: GEN:  Weak- and deconditioned-appearing, significantly cachectic-appearing ABD:  Flat, non-tender, non-distended, scant bowel sounds, no peritonitis  Lab Results: CBC    Component Value Date/Time   WBC 9.7 02/27/2015 0552   RBC 3.82* 02/27/2015 0552   HGB 10.3* 02/27/2015 0552   HCT 31.1* 02/27/2015 0552   PLT 414* 02/27/2015 0552   MCV 81.4 02/27/2015 0552   MCH 27.0 02/27/2015 0552   MCHC 33.1 02/27/2015 0552   RDW 14.8 02/27/2015 0552   LYMPHSABS 1.1 02/22/2015 1036   MONOABS 2.8* 02/22/2015 1036   EOSABS 0.0 02/22/2015 1036   BASOSABS 0.0 02/22/2015 1036   CMP     Component Value Date/Time   NA 133* 02/27/2015 0552   K 4.5 02/27/2015 0552   CL 102 02/27/2015 0552   CO2 25 02/27/2015 0552   GLUCOSE 126* 02/27/2015 0552   BUN 9 02/27/2015 0552   CREATININE 0.69 03/01/2015 0532   CALCIUM 8.4* 02/27/2015 0552   PROT 6.3* 02/22/2015 1036   ALBUMIN 2.8* 02/22/2015 1036   AST 33 02/22/2015 1036   ALT 37 02/22/2015 1036   ALKPHOS 83 02/22/2015 1036   BILITOT 0.7 02/22/2015 1036   GFRNONAA >60 03/01/2015 0532   GFRAA >60 03/01/2015 0532   Assessment:  1.  Diarrhea.  Suspect refractory C. Diff.  Lack of blood in stool argues against ulcerative colitis. 2.  Significant weakness. 3.  Protein calorie malnutrition.  Plan:  1.  ID consultation. 2.  Continue medical therapy. 3.  Not sure what Align is adding to things, she is already on Florastor.  ID can d/c if seems appropriate. 4.  Will  await ID thoughts, specifically regarding need for Dificid versus sigmoidoscopy versus eventual need for fecal transplant. 5.  Will follow.   Landry Dyke 03/01/2015, 10:51 AM   Pager [phone] If no answer or after 5 PM call [phone]

## 2015-03-01 NOTE — Progress Notes (Signed)
[facility] PROGRESS NOTE    Progress Note   JALEEAH SLIGHT FXJ:[id number] DOB: 03/15/1928 DOA: 02/22/2015 PCP: Elton Sin, FNP   Brief Narrative:   Stacy Mcguire is an 81 y.o. female 81 year old with past medical history of ulcerative colitis on a PPI and frequent UTIs who completed a course of antibiotic, admitted on 02/22/2015 with chief complaint of abdominal pain and profuse diarrhea, on admission she was found to have a white count of 35 with a creatinine of 1.1.  Assessment/Plan:   Sepsis due to refractory C. difficile colitis/ulcerative colitis: Started empirically on oral vancomycin with no improvement IV Flagyl and vancomycin enemas were given. GI was consulted, they recommended to continue current regimen. He continues to have watery stools. Appreciate GIs assistance, question if she needs sigmoidoscopy. KUB showed no colonic dilation. Continue Lialda for distal UC. She looks more weak and pale, she seems not to be improving.  Acute kidney injury: Due to diarrhea that is oval IV fluid hydration.  Hyponatremia/hypokalemia: likely prerenal now resolved.  Metabolic acidosis: Likely due to diarrhea, require bicarbonate drip was stopped Bicarbonate has remained stable.  Thrombocytosis ([place]) Likely reactive due to infectious etiology and possibly ulcerative colitis, is now improving.  Severe protein caloric malnutrition: Continue supplementation.   DVT Prophylaxis - Lovenox ordered.  Family Communication: none Disposition Plan: Home when stable. Code Status:     Code Status Orders        Start     Ordered   02/22/15 1743  Full code   Continuous     02/22/15 1742        IV Access:    Peripheral IV   Procedures and diagnostic studies:   Dg Abd 1 View  02/28/2015   CLINICAL DATA:  Abdominal pain.  History of ulcerative colitis.  EXAM: ABDOMEN - 1 VIEW  COMPARISON:  February 22, 2015  FINDINGS: There is no bowel dilatation or  air-fluid level suggesting obstruction. No free air or portal venous air. Lung bases are clear. There is lumbar dextroscoliosis with extensive degenerative type change in the lumbar spine. There is sacroiliitis on the left. Much milder sacroiliitis is noted on the right.  IMPRESSION: Bowel gas pattern unremarkable. Sacroiliitis bilaterally, considerably more severe on the left than on the right. Arthropathy in the lumbar spine with dextroscoliosis. Lung bases clear.   Electronically Signed   By: Lowella Grip III M.D.   On: 02/28/2015 11:13     Medical Consultants:    None.  Anti-Infectives:   Anti-infectives    Start     Dose/Rate Route Frequency Ordered Stop   02/26/15 1800  vancomycin (VANCOCIN) 500 mg in sodium chloride irrigation 0.9 % 100 mL ENEMA     500 mg Rectal 4 times per day 02/26/15 1451 03/12/15 1759   02/25/15 1200  vancomycin (VANCOCIN) 50 mg/mL oral solution 500 mg     500 mg Oral 4 times per day 02/25/15 0900     02/25/15 1000  vancomycin (VANCOCIN) 500 mg in sodium chloride irrigation 0.9 % 100 mL ENEMA  Status:  Discontinued     500 mg Rectal Every 6 hours 02/25/15 0841 02/26/15 1304   02/25/15 1000  fidaxomicin (DIFICID) tablet 200 mg  Status:  Discontinued     200 mg Oral 2 times daily 02/25/15 0859 02/25/15 0901   02/25/15 1000  metroNIDAZOLE (FLAGYL) IVPB 500 mg     500 mg 100 mL/hr over 60 Minutes Intravenous Every 8 hours 02/25/15 0901  02/22/15 1800  vancomycin (VANCOCIN) 50 mg/mL oral solution 125 mg  Status:  Discontinued     125 mg Oral 4 times per day 02/22/15 1537 02/25/15 0900      Subjective:    Stacy Mcguire patient relates she continues to have watery stools.  Objective:    Filed Vitals:   02/28/15 0549 02/28/15 1400 02/28/15 2152 03/01/15 0558  BP: 151/81 157/68 156/75 156/84  Pulse: 86 84 86 89  Temp: 98 F (36.7 C) 97.6 F (36.4 C) 97.7 F (36.5 C) 97.5 F (36.4 C)  TempSrc: Oral Oral Oral Oral  Resp: 18 20 18 19   Height:       Weight: 53.5 kg (117 lb 15.1 oz)   51.3 kg (113 lb 1.5 oz)  SpO2: 96% 97% 97% 98%    Intake/Output Summary (Last 24 hours) at 03/01/15 0920 Last data filed at 02/28/15 2153  Gross per 24 hour  Intake    240 ml  Output      0 ml  Net    240 ml   Filed Weights   02/27/15 0513 02/28/15 0549 03/01/15 0558  Weight: 55.2 kg (121 lb 11.1 oz) 53.5 kg (117 lb 15.1 oz) 51.3 kg (113 lb 1.5 oz)    Exam: Gen:  NAD Cardiovascular:  RRR, No M/R/G Respiratory:  Lungs CTAB Gastrointestinal:  Abdomen soft, still tender + BS Extremities:  No C/E/C   Data Reviewed:    Labs: Basic Metabolic Panel:  Recent Labs Lab 02/23/15 0615 02/24/15 0545 02/25/15 0940 02/26/15 0610 02/27/15 0552 03/01/15 0532  NA 130* 132* 131* 131* 133*  --   K 3.5 3.2* 2.5* 3.0* 4.5  --   CL 100* 103 102 99* 102  --   CO2 20* 19* 22 25 25   --   GLUCOSE 109* 79 130* 130* 126*  --   BUN 21* 18 11 8 9   --   CREATININE 0.91 0.83 0.67 0.62 0.79 0.69  CALCIUM 8.6* 8.3* 8.2* 8.1* 8.4*  --   MG 1.7  --   --   --   --   --    GFR Estimated Creatinine Clearance: 42.4 mL/min (by C-G formula based on Cr of 0.69). Liver Function Tests:  Recent Labs Lab 02/22/15 1036  AST 33  ALT 37  ALKPHOS 83  BILITOT 0.7  PROT 6.3*  ALBUMIN 2.8*    Recent Labs Lab 02/22/15 1036  LIPASE <10*   No results for input(s): AMMONIA in the last 168 hours. Coagulation profile  Recent Labs Lab 02/22/15 1830  INR 1.17    CBC:  Recent Labs Lab 02/22/15 1036 02/22/15 1830 02/23/15 0615 02/24/15 0545 02/25/15 0940 02/27/15 0552  WBC 35.3* 29.8* 29.4* 19.0* 13.1* 9.7  NEUTROABS 31.4*  --   --   --   --   --   HGB 11.4* 10.9* 10.8* 9.7* 10.5* 10.3*  HCT 33.6* 32.5* 32.2* 29.3* 31.0* 31.1*  MCV 81.2 82.9 82.1 82.1 81.8 81.4  PLT 449* 429* 418* 401* 442* 414*   Cardiac Enzymes: No results for input(s): CKTOTAL, CKMB, CKMBINDEX, TROPONINI in the last 168 hours. BNP (last 3 results) No results for input(s):  PROBNP in the last 8760 hours. CBG: No results for input(s): GLUCAP in the last 168 hours. D-Dimer: No results for input(s): DDIMER in the last 72 hours. Hgb A1c: No results for input(s): HGBA1C in the last 72 hours. Lipid Profile: No results for input(s): CHOL, HDL, LDLCALC, TRIG, CHOLHDL, LDLDIRECT in  the last 72 hours. Thyroid function studies: No results for input(s): TSH, T4TOTAL, T3FREE, THYROIDAB in the last 72 hours.  Invalid input(s): FREET3 Anemia work up: No results for input(s): VITAMINB12, FOLATE, FERRITIN, TIBC, IRON, RETICCTPCT in the last 72 hours. Sepsis Labs:  Recent Labs Lab 02/22/15 1045 02/22/15 1440 02/22/15 1830 02/22/15 2152 02/23/15 0615 02/24/15 0545 02/25/15 0940 02/27/15 0552  PROCALCITON  --   --  0.40  --   --   --   --   --   WBC  --   --  29.8*  --  29.4* 19.0* 13.1* 9.7  LATICACIDVEN 1.17 1.48 1.2 1.2  --   --   --   --    Microbiology Recent Results (from the past 240 hour(s))  C difficile quick scan w PCR reflex     Status: Abnormal   Collection Time: 02/22/15  3:22 PM  Result Value Ref Range Status   C Diff antigen POSITIVE (A) NEGATIVE Final   C Diff toxin POSITIVE (A) NEGATIVE Final   C Diff interpretation Positive for toxigenic C. difficile  Final    Comment: CRITICAL RESULT CALLED TO, READ BACK BY AND VERIFIED WITH: C HALL AT 1031 ON 09.26.2016 BY NBROOKS   Culture, blood (x 2)     Status: None   Collection Time: 02/22/15  6:30 PM  Result Value Ref Range Status   Specimen Description BLOOD RIGHT ARM  Final   Special Requests BOTTLES DRAWN AEROBIC AND ANAEROBIC 5CC  Final   Culture   Final    NO GROWTH 5 DAYS Performed at [facility]    Report Status 02/27/2015 FINAL  Final  Culture, blood (x 2)     Status: None   Collection Time: 02/22/15  6:41 PM  Result Value Ref Range Status   Specimen Description BLOOD RIGHT HAND  Final   Special Requests IN PEDIATRIC BOTTLE 3CC  Final   Culture   Final    NO GROWTH 5  DAYS Performed at [facility]    Report Status 02/27/2015 FINAL  Final     Medications:   . bifidobacterium infantis  1 capsule Oral Daily  . enoxaparin (LOVENOX) injection  40 mg Subcutaneous Q24H  . feeding supplement  1 Container Oral q morning - 10a  . feeding supplement (ENSURE ENLIVE)  237 mL Oral BID BM  . mesalamine  4.8 g Oral Q breakfast  . metronidazole  500 mg Intravenous Q8H  . saccharomyces boulardii  250 mg Oral BID  . sertraline  50 mg Oral Daily  . sodium chloride  3 mL Intravenous Q12H  . vancomycin  500 mg Oral 4 times per day  . vancomycin (VANCOCIN) rectal ENEMA  500 mg Rectal 4 times per day   Continuous Infusions: . dextrose 5 % and 0.9% NaCl 75 mL/hr at 02/28/15 0554    Time spent: 15 min   LOS: 7 days   Charlynne Cousins  [facility] Pager [phone]  *Please refer to [email or web address], password [email or web address] to get updated schedule on who will round on this patient, as hospitalists switch teams weekly. If 7PM-7AM, please contact night-coverage at [email or web address], password [email or web address] for any overnight needs.  03/01/2015, 9:20 AM

## 2015-03-01 NOTE — Care Management Note (Signed)
Case Management Note  Patient Details  Name: Stacy Mcguire MRN: [id number] Date of Birth: 1927/07/10  Subjective/Objective:  c diff.CSW following for SNF.                  Action/Plan:d/c SNF   Expected Discharge Date:   (unknown)               Expected Discharge Plan:  [place]  In-House Referral:  Clinical Social Work  Discharge planning Services  CM Consult  Post Acute Care Choice:  NA Choice offered to:  NA  DME Arranged:  N/A DME Agency:  NA  HH Arranged:  NA HH Agency:  NA  Status of Service:  Completed, signed off  Medicare Important Message Given:  Yes-second notification given Date Medicare IM Given:    Medicare IM give by:    Date Additional Medicare IM Given:    Additional Medicare Important Message give by:     If discussed at [place] of Stay Meetings, dates discussed:    Additional Comments:  Dessa Phi, RN 03/01/2015, 9:11 PM

## 2015-03-01 NOTE — Progress Notes (Addendum)
CSW called [facility], per admissions ([phone]), facility received insurance auth for patient today - Josem Kaufmann is good for 48 hours.  Edwyna Shell, LCSW Clinical Social Worker

## 2015-03-02 ENCOUNTER — Other Ambulatory Visit: Payer: Medicare PPO

## 2015-03-02 NOTE — Progress Notes (Signed)
CSW spoke with Hoyle Sauer at [facility]. They will take the patient once medically stable and ready for discharge. Accepting MD is [place]. RN number to call report is [phone]. CSW will contact family to inform of possible d/c tomorrow, and bed offer.   Lucius Conn, [place] Social Worker [place] [phone]

## 2015-03-02 NOTE — Progress Notes (Signed)
Physical Therapy Treatment Patient Details Name: Stacy Mcguire MRN: [id number] DOB: 26-Feb-1928 Today's Date: 03/08/2015    History of Present Illness 81 y.o. female admitted with diarrhea. Dx of c diff, sepsis, ARF.     PT Comments    Pt reports feeling weaker since admission and reports diarrhea worse today then usual so assisted to [facility] first and then ambulated around room to pt tolerance.  Follow Up Recommendations  SNF     Equipment Recommendations  3in1 (PT)    Recommendations for Other Services       Precautions / Restrictions Precautions Precautions: Fall    Mobility  Bed Mobility Overal bed mobility: Modified Independent             General bed mobility comments: HOB up 45*, used rail  Transfers Overall transfer level: Needs assistance Equipment used: Rolling walker (2 wheeled) Transfers: Sit to/from [facility] Sit to Stand: Min guard Stand pivot transfers: Min guard       General transfer comment: close guard for safety. VCs safety, hand placement  Ambulation/Gait Ambulation/Gait assistance: Min guard Ambulation Distance (Feet): 25 Feet (x2) Assistive device: Rolling walker (2 wheeled) Gait Pattern/deviations: Step-through pattern;Decreased stride length     General Gait Details: close guard for safety. VCs safety.    Stairs            Wheelchair Mobility    Modified Rankin (Stroke Patients Only)       Balance                                    Cognition Arousal/Alertness: Awake/alert Behavior During Therapy: WFL for tasks assessed/performed Overall Cognitive Status: Within Functional Limits for tasks assessed                      Exercises      General Comments        Pertinent Vitals/Pain Pain Assessment: No/denies pain    Home Living                      Prior Function            PT Goals (current goals can now be found in the care plan section) Progress  towards PT goals: Progressing toward goals    Frequency  Min 3X/week    PT Plan Current plan remains appropriate    Co-evaluation             End of Session   Activity Tolerance: Patient tolerated treatment well Patient left: with call bell/phone within reach;in bed;with bed alarm set     Time: 1130-1145 PT Time Calculation (min) (ACUTE ONLY): 15 min  Charges:  $Gait Training: 8-22 mins                    G Codes:      Myron Lona,KATHrine E 2015/03/08, 1:20 PM Carmelia Bake, PT, DPT Mar 08, 2015 Pager: [phone]

## 2015-03-02 NOTE — Progress Notes (Signed)
Nutrition Follow-up  DOCUMENTATION CODES:   Severe malnutrition in context of chronic illness  INTERVENTION:  - Continue Ensure Enlive BID and Boost Breeze once/day - Encourage intakes be spread out throughout the day rather than 3 meals only - RD will continue to monitor for needs  NUTRITION DIAGNOSIS:   Inadequate oral intake related to acute illness, nausea, vomiting as evidenced by per patient/family report. -ongoing  GOAL:   Patient will meet greater than or equal to 90% of their needs -unmet  MONITOR:   PO intake, Supplement acceptance, Weight trends, Labs, I & O's  ASSESSMENT:   81 yo female who presented to [facility] ED with main concern of several days duration of progressively worsening watery diarrhea over 10 episodes per day, associated with abd cramps, intermittent in nature and involving lower abd quadrants, occasionally radiating to the entire abdomen. This has been associated with poor oral intake and malaise. Pt reports she was recently treated with ABX for UTI. She denies similar events in the past. No fevers, chills, no urinary concerns.   10/4 Pt reports that appetite is slightly improving and that nausea has resolved. She states that she was able to consume chicken broth, yogurt, and a few sips of Ensure for breakfast this AM. She states that she gets full very quickly; encouraged intakes be spread out throughout the day.   Severe muscle and fat wasting noted to upper body showing severe malnutrition. Not meeting needs. Medications reviewed. Labs reviewed; Na: 133 mmol/L, Ca: 8.4 mg/dL.   9/27 - Lunch tray in room is untouched and pt states she was unable to consume breakfast this AM.  - She denies abdominal pain or nausea at this time but states that this was occuring for 1-2 weeks PTA.  - Notes also indicate diarrhea during this time frame. - Pt did not feel up to talking further or answering further questions at this time.  - Will need to obtain more information  about intakes PTA at follow-up as well as recent weight trends and UBW PTA.  Diet Order:  Diet full liquid Room service appropriate?: Yes; Fluid consistency:: Thin  Skin:  Reviewed, no issues  Last BM:  10/4  Height:   Ht Readings from Last 1 Encounters:  02/22/15 5\' 4"  (1.626 m)    Weight:   Wt Readings from Last 1 Encounters:  03/02/15 126 lb 5.2 oz (57.3 kg)    Ideal Body Weight:  54.54 kg (kg)  BMI:  Body mass index is 21.67 kg/(m^2).  Estimated Nutritional Needs:   Kcal:  1370-1570  Protein:  55-65 grams  Fluid:  2.2-2.5 L/day  EDUCATION NEEDS:   No education needs identified at this time     Jarome Matin, RD, LDN Inpatient Clinical Dietitian Pager # [phone] After hours/weekend pager # [phone]

## 2015-03-02 NOTE — Care Management Important Message (Signed)
Important Message  Patient Details IM Letter given to Suzanne/ Case Manager to present to [place] Message  Patient Details  Name: EVONNE RINKS MRN: [id number] Date of Birth: December 16, 1927   Medicare Important Message Given:  Yes-second notification given    Camillo Flaming 03/02/2015, 1:27 [place] Message  Patient Details  Name: JEHAN RANGANATHAN MRN: [id number] Date of Birth: September 06, 1927   Medicare Important Message Given:  Yes-second notification given    Camillo Flaming 03/02/2015, 1:26 PM Name: LATISIA HILAIRE MRN: [id number] Date of Birth: 1927/07/02   Medicare Important Message Given:  Yes-second notification given    Camillo Flaming 03/02/2015, 1:26 PM

## 2015-03-02 NOTE — Progress Notes (Signed)
Will see how things do on Dificid.  If not improved in the next couple days, consider sigmoidoscopy to exclude concomitant active ulcerative colitis.  Appreciate ID consultation.

## 2015-03-02 NOTE — Progress Notes (Signed)
[facility] PROGRESS NOTE    Progress Note   Stacy Mcguire XTA:[id number] DOB: Feb 20, 1928 DOA: 02/22/2015 PCP: Elton Sin, FNP   Brief Narrative:   Stacy Mcguire is an 81 y.o. female 81 year old with past medical history of ulcerative colitis on a PPI and frequent UTIs who completed a course of antibiotic, admitted on 02/22/2015 with chief complaint of abdominal pain and profuse diarrhea, on admission she was found to have a white count of 35 with a creatinine of 1.1. Was diagnosed with sepsis and CHF, started on bank empirically on admission her diarrhea did not improve. IV Flagyl and thank enemas were added with no improvement so GI was consulted who recommended an ID consult. Recommended to start deficit. If there is no improvement in her bowel movements then GI will proceed with flexible sigmoidoscopy note endoscopy.  Assessment/Plan:   Sepsis due to refractory C. difficile colitis/ulcerative colitis: Started empirically on oral vancomycin with no improvement IV Flagyl and vancomycin enemas were given. Continue fluoroscopy store GI was consulted, they recommended to continue current regimen. KUB showed no colonic dilation. ID recommended deficit, GI will like to continue to observe if no improvement then we'll proceed with flexible endoscopy  Acute kidney injury: Due to diarrhea, now resolved. KVO IV fluids  Hyponatremia/hypokalemia: likely prerenal now resolved.  Metabolic acidosis: Likely due to diarrhea, require bicarbonate drip was stopped Bicarbonate has remained stable.  Thrombocytosis ([place]) Likely reactive due to infectious etiology and possibly ulcerative colitis and or C. Dif colitis, is now improving.  Severe protein caloric malnutrition: Continue supplementation.   DVT Prophylaxis - Lovenox ordered.  Family Communication: none Disposition Plan: Home once stools more formed. Code Status:     Code Status Orders        Start     Ordered   02/22/15 1743  Full code   Continuous     02/22/15 1742        IV Access:    Peripheral IV   Procedures and diagnostic studies:   Dg Abd 1 View  02/28/2015   CLINICAL DATA:  Abdominal pain.  History of ulcerative colitis.  EXAM: ABDOMEN - 1 VIEW  COMPARISON:  February 22, 2015  FINDINGS: There is no bowel dilatation or air-fluid level suggesting obstruction. No free air or portal venous air. Lung bases are clear. There is lumbar dextroscoliosis with extensive degenerative type change in the lumbar spine. There is sacroiliitis on the left. Much milder sacroiliitis is noted on the right.  IMPRESSION: Bowel gas pattern unremarkable. Sacroiliitis bilaterally, considerably more severe on the left than on the right. Arthropathy in the lumbar spine with dextroscoliosis. Lung bases clear.   Electronically Signed   By: Lowella Grip III M.D.   On: 02/28/2015 11:13     Medical Consultants:    None.  Anti-Infectives:   Anti-infectives    Start     Dose/Rate Route Frequency Ordered Stop   03/01/15 1800  fidaxomicin (DIFICID) tablet 200 mg     200 mg Oral 2 times daily 03/01/15 1551     02/26/15 1800  vancomycin (VANCOCIN) 500 mg in sodium chloride irrigation 0.9 % 100 mL ENEMA  Status:  Discontinued     500 mg Rectal 4 times per day 02/26/15 1451 03/01/15 1551   02/25/15 1200  vancomycin (VANCOCIN) 50 mg/mL oral solution 500 mg  Status:  Discontinued     500 mg Oral 4 times per day 02/25/15 0900 03/01/15 1551   02/25/15 1000  vancomycin (  VANCOCIN) 500 mg in sodium chloride irrigation 0.9 % 100 mL ENEMA  Status:  Discontinued     500 mg Rectal Every 6 hours 02/25/15 0841 02/26/15 1304   02/25/15 1000  fidaxomicin (DIFICID) tablet 200 mg  Status:  Discontinued     200 mg Oral 2 times daily 02/25/15 0859 02/25/15 0901   02/25/15 1000  metroNIDAZOLE (FLAGYL) IVPB 500 mg  Status:  Discontinued     500 mg 100 mL/hr over 60 Minutes Intravenous Every 8 hours 02/25/15 0901 03/01/15 1551    02/22/15 1800  vancomycin (VANCOCIN) 50 mg/mL oral solution 125 mg  Status:  Discontinued     125 mg Oral 4 times per day 02/22/15 1537 02/25/15 0900      Subjective:    Stacy Mcguire patient relates she continues to have watery stools. Had 4 watery bowel movements overnight  Objective:    Filed Vitals:   03/01/15 1641 03/01/15 2133 03/02/15 0451 03/02/15 0550  BP: 121/74 159/82 124/87   Pulse: 107 91 91   Temp: 97.6 F (36.4 C) 98.2 F (36.8 C) 97.5 F (36.4 C)   TempSrc: Oral Oral Oral   Resp: 19 18 18    Height:      Weight:    57.3 kg (126 lb 5.2 oz)  SpO2: 98% 97% 98%    No intake or output data in the 24 hours ending 03/02/15 1018 Filed Weights   02/28/15 0549 03/01/15 0558 03/02/15 0550  Weight: 53.5 kg (117 lb 15.1 oz) 51.3 kg (113 lb 1.5 oz) 57.3 kg (126 lb 5.2 oz)    Exam: Gen:  NAD Cardiovascular:  RRR, No M/R/G Respiratory:  Lungs CTAB Gastrointestinal:  Abdomen soft, less tender + BS Extremities:  No C/E/C   Data Reviewed:    Labs: Basic Metabolic Panel:  Recent Labs Lab 02/24/15 0545 02/25/15 0940 02/26/15 0610 02/27/15 0552 03/01/15 0532  NA 132* 131* 131* 133*  --   K 3.2* 2.5* 3.0* 4.5  --   CL 103 102 99* 102  --   CO2 19* 22 25 25   --   GLUCOSE 79 130* 130* 126*  --   BUN 18 11 8 9   --   CREATININE 0.83 0.67 0.62 0.79 0.69  CALCIUM 8.3* 8.2* 8.1* 8.4*  --    GFR Estimated Creatinine Clearance: 45.2 mL/min (by C-G formula based on Cr of 0.69). Liver Function Tests: No results for input(s): AST, ALT, ALKPHOS, BILITOT, PROT, ALBUMIN in the last 168 hours. No results for input(s): LIPASE, AMYLASE in the last 168 hours. No results for input(s): AMMONIA in the last 168 hours. Coagulation profile No results for input(s): INR, PROTIME in the last 168 hours.  CBC:  Recent Labs Lab 02/24/15 0545 02/25/15 0940 02/27/15 0552  WBC 19.0* 13.1* 9.7  HGB 9.7* 10.5* 10.3*  HCT 29.3* 31.0* 31.1*  MCV 82.1 81.8 81.4  PLT 401* 442*  414*   Cardiac Enzymes: No results for input(s): CKTOTAL, CKMB, CKMBINDEX, TROPONINI in the last 168 hours. BNP (last 3 results) No results for input(s): PROBNP in the last 8760 hours. CBG: No results for input(s): GLUCAP in the last 168 hours. D-Dimer: No results for input(s): DDIMER in the last 72 hours. Hgb A1c: No results for input(s): HGBA1C in the last 72 hours. Lipid Profile: No results for input(s): CHOL, HDL, LDLCALC, TRIG, CHOLHDL, LDLDIRECT in the last 72 hours. Thyroid function studies: No results for input(s): TSH, T4TOTAL, T3FREE, THYROIDAB in the last 72 hours.  Invalid input(s): FREET3 Anemia work up: No results for input(s): VITAMINB12, FOLATE, FERRITIN, TIBC, IRON, RETICCTPCT in the last 72 hours. Sepsis Labs:  Recent Labs Lab 02/24/15 0545 02/25/15 0940 02/27/15 0552  WBC 19.0* 13.1* 9.7   Microbiology Recent Results (from the past 240 hour(s))  C difficile quick scan w PCR reflex     Status: Abnormal   Collection Time: 02/22/15  3:22 PM  Result Value Ref Range Status   C Diff antigen POSITIVE (A) NEGATIVE Final   C Diff toxin POSITIVE (A) NEGATIVE Final   C Diff interpretation Positive for toxigenic C. difficile  Final    Comment: CRITICAL RESULT CALLED TO, READ BACK BY AND VERIFIED WITH: C HALL AT 8469 ON 09.26.2016 BY NBROOKS   Culture, blood (x 2)     Status: None   Collection Time: 02/22/15  6:30 PM  Result Value Ref Range Status   Specimen Description BLOOD RIGHT ARM  Final   Special Requests BOTTLES DRAWN AEROBIC AND ANAEROBIC 5CC  Final   Culture   Final    NO GROWTH 5 DAYS Performed at [facility]    Report Status 02/27/2015 FINAL  Final  Culture, blood (x 2)     Status: None   Collection Time: 02/22/15  6:41 PM  Result Value Ref Range Status   Specimen Description BLOOD RIGHT HAND  Final   Special Requests IN PEDIATRIC BOTTLE 3CC  Final   Culture   Final    NO GROWTH 5 DAYS Performed at [facility]    Report  Status 02/27/2015 FINAL  Final     Medications:   . enoxaparin (LOVENOX) injection  40 mg Subcutaneous Q24H  . feeding supplement  1 Container Oral q morning - 10a  . feeding supplement (ENSURE ENLIVE)  237 mL Oral BID BM  . fidaxomicin  200 mg Oral BID  . mesalamine  4.8 g Oral Q breakfast  . saccharomyces boulardii  250 mg Oral BID  . sertraline  50 mg Oral Daily  . sodium chloride  3 mL Intravenous Q12H   Continuous Infusions: . dextrose 5 % and 0.9% NaCl 75 mL/hr at 02/28/15 0554    Time spent: 15 min   LOS: 8 days   Charlynne Cousins  [facility] Pager [phone]  *Please refer to [email or web address], password [email or web address] to get updated schedule on who will round on this patient, as hospitalists switch teams weekly. If 7PM-7AM, please contact night-coverage at [email or web address], password [email or web address] for any overnight needs.  03/02/2015, 10:18 AM

## 2015-03-03 DIAGNOSIS — N179 Acute kidney failure, unspecified: Secondary | ICD-10-CM

## 2015-03-03 DIAGNOSIS — E871 Hypo-osmolality and hyponatremia: Secondary | ICD-10-CM

## 2015-03-03 DIAGNOSIS — R1084 Generalized abdominal pain: Secondary | ICD-10-CM

## 2015-03-03 LAB — CBC
HCT: 32 % — ABNORMAL LOW (ref 36.0–46.0)
Hemoglobin: 10.8 g/dL — ABNORMAL LOW (ref 12.0–15.0)
MCH: 27.5 pg (ref 26.0–34.0)
MCHC: 33.8 g/dL (ref 30.0–36.0)
MCV: 81.4 fL (ref 78.0–100.0)
PLATELETS: 402 10*3/uL — AB (ref 150–400)
RBC: 3.93 MIL/uL (ref 3.87–5.11)
RDW: 15.4 % (ref 11.5–15.5)
WBC: 11.5 10*3/uL — AB (ref 4.0–10.5)

## 2015-03-03 LAB — BASIC METABOLIC PANEL
ANION GAP: 7 (ref 5–15)
BUN: 11 mg/dL (ref 6–20)
CO2: 25 mmol/L (ref 22–32)
CREATININE: 0.82 mg/dL (ref 0.44–1.00)
Calcium: 8.7 mg/dL — ABNORMAL LOW (ref 8.9–10.3)
Chloride: 102 mmol/L (ref 101–111)
GFR calc Af Amer: >60 mL/min (ref >60)
GFR calc non Af Amer: >60 mL/min (ref >60)
Glucose, Bld: 106 mg/dL — ABNORMAL HIGH (ref 65–99)
POTASSIUM: 3 mmol/L — AB (ref 3.5–5.1)
SODIUM: 134 mmol/L — AB (ref 135–145)

## 2015-03-03 MED ORDER — POTASSIUM CHLORIDE CRYS ER 20 MEQ PO TBCR
40.0000 meq | EXTENDED_RELEASE_TABLET | Freq: Two times a day (BID) | ORAL | Status: AC
  Administered 2015-03-03 (×2): 40 meq via ORAL
  Filled 2015-03-03 (×2): qty 2

## 2015-03-03 NOTE — Progress Notes (Signed)
    [place] for Infectious Disease   Date of Admission:  02/22/2015  Antibiotics: Dificid  Subjective: Feels better, more formed stools, less output, no abdominal pain  Objective: Temp:  [97.5 F (36.4 C)-98.2 F (36.8 C)] 97.5 F (36.4 C) (10/05 0609) Pulse Rate:  [82-97] 82 (10/05 0609) Resp:  [18-19] 18 (10/05 0609) BP: (138-140)/(65-74) 138/65 mmHg (10/05 0609) SpO2:  [95 %-97 %] 97 % (10/05 0609) Weight:  [110 lb 3.7 oz (50 kg)] 110 lb 3.7 oz (50 kg) (10/05 0609)  General: awake, alert, nad Skin: no rashes Lungs: CTA B Cor: RRR Abdomen: soft, nt, nd Ext: no edema  A comprehensive review of systems was negative except for: Constitutional: positive for anorexia  Lab Results Lab Results  Component Value Date   WBC 11.5* 03/03/2015   HGB 10.8* 03/03/2015   HCT 32.0* 03/03/2015   MCV 81.4 03/03/2015   PLT 402* 03/03/2015    Lab Results  Component Value Date   CREATININE 0.82 03/03/2015   BUN 11 03/03/2015   NA 134* 03/03/2015   K 3.0* 03/03/2015   CL 102 03/03/2015   CO2 25 03/03/2015    Lab Results  Component Value Date   ALT 37 02/22/2015   AST 33 02/22/2015   ALKPHOS 83 02/22/2015   BILITOT 0.7 02/22/2015      Microbiology: Recent Results (from the past 240 hour(s))  C difficile quick scan w PCR reflex     Status: Abnormal   Collection Time: 02/22/15  3:22 PM  Result Value Ref Range Status   C Diff antigen POSITIVE (A) NEGATIVE Final   C Diff toxin POSITIVE (A) NEGATIVE Final   C Diff interpretation Positive for toxigenic C. difficile  Final    Comment: CRITICAL RESULT CALLED TO, READ BACK BY AND VERIFIED WITH: C HALL AT 1618 ON 09.26.2016 BY NBROOKS   Culture, blood (x 2)     Status: None   Collection Time: 02/22/15  6:30 PM  Result Value Ref Range Status   Specimen Description BLOOD RIGHT ARM  Final   Special Requests BOTTLES DRAWN AEROBIC AND ANAEROBIC 5CC  Final   Culture   Final    NO GROWTH 5 DAYS Performed at [facility]    Report Status 02/27/2015 FINAL  Final  Culture, blood (x 2)     Status: None   Collection Time: 02/22/15  6:41 PM  Result Value Ref Range Status   Specimen Description BLOOD RIGHT HAND  Final   Special Requests IN PEDIATRIC BOTTLE 3CC  Final   Culture   Final    NO GROWTH 5 DAYS Performed at [facility]    Report Status 02/27/2015 FINAL  Final    Studies/Results: No results found.  Assessment/Plan:  1) C diff - improved now on Dificid.  Treat for 14 days total.  Avoid antibiotics in future, avoid ppi as much as possible.    Will sign off, thanks for consult  Scharlene Gloss, [place] for Infectious Disease [place] [email or web address] [phone] pager   [phone] cell 03/03/2015, 10:48 AM

## 2015-03-03 NOTE — Progress Notes (Signed)
Improving on Dificid, course of therapy of which has been outlined by Dr. Linus Salmons.  Agree with slow advance of diet.  Agree that sigmoidoscopy likely not needed now.  Will sign-off; she can follow-up with Dr. Amedeo Plenty [facility], [phone]), her primary gastroenterologist, upon hospital discharge.  Thank you for the consultation.

## 2015-03-03 NOTE — Progress Notes (Signed)
CSW spoke with Hoyle Sauer from [name] to inform that patient would not be returning to facility on today. MD advanced patient's diet and expected discharge date is 10/6. CSW will continue to follow up with [facility] regarding patient's discharge status.   Lucius Conn, [place] Clinical Social Worker [place] [phone]

## 2015-03-03 NOTE — Progress Notes (Addendum)
[facility] PROGRESS NOTE    Progress Note   Stacy Mcguire IEP:[id number] DOB: Oct 05, 1927 DOA: 02/22/2015 PCP: Elton Sin, FNP   Brief Narrative:   Stacy Mcguire is an 81 y.o. female 81 year old with past medical history of ulcerative colitis on a PPI and frequent UTIs who completed a course of antibiotic, admitted on 02/22/2015 with chief complaint of abdominal pain and profuse diarrhea, on admission she was found to have a white count of 35 with a creatinine of 1.1. Was diagnosed with sepsis and C diff colitis, was  started on Vanocmycin empirically on admission her diarrhea did not improve. Then IV Flagyl and Vanc enemas were added with no improvement so GI was consulted who recommended an ID consult.  10/3 started on Dificid and now starting to improve  Assessment/Plan:   Sepsis due to refractory C. difficile colitis Started empirically on oral vancomycin on admission with no improvement, then switched to IV Flagyl and vancomycin enemas were given.  -due to persistence of symptoms GI and ID were consulted -KUB showed no colonic dilation. -on 10/3 started on Po FIdoxamicin per Dr.Comer, now starting to improve with less diarrhea and more formed stools today -will advance to bland diet -DC to  SNF tomorrow on Fidoxamicin for 2 weeks total -Doesnt appear that she will need a Flex sig at this time  H/o Ulcerative colitis -continue mesalamine, FU with GI  Acute kidney injury: Due to diarrhea, now resolved. KVO IV fluids  Hyponatremia/hypokalemia: -improved, replace K.  Metabolic acidosis: Likely due to diarrhea, was on bicarb gtt on admission  Thrombocytosis ([place]) Likely reactive due to infectious etiology and possibly ulcerative colitis and or C. Dif colitis, is now improving.  Severe protein caloric malnutrition: Continue supplementation.  DVT Prophylaxis - Lovenox ordered.  Family Communication: none, called and updated daughter in law Stacy Mcguire  Plan: SNF tomorrow Code Status: Full Code    Code Status Orders        Start     Ordered   02/22/15 1743  Full code   Continuous     02/22/15 1742        IV Access:    Peripheral IV   Procedures and diagnostic studies:   No results found.   Medical Consultants:    None.  Anti-Infectives:   Anti-infectives    Start     Dose/Rate Route Frequency Ordered Stop   03/01/15 1800  fidaxomicin (DIFICID) tablet 200 mg     200 mg Oral 2 times daily 03/01/15 1551     02/26/15 1800  vancomycin (VANCOCIN) 500 mg in sodium chloride irrigation 0.9 % 100 mL ENEMA  Status:  Discontinued     500 mg Rectal 4 times per day 02/26/15 1451 03/01/15 1551   02/25/15 1200  vancomycin (VANCOCIN) 50 mg/mL oral solution 500 mg  Status:  Discontinued     500 mg Oral 4 times per day 02/25/15 0900 03/01/15 1551   02/25/15 1000  vancomycin (VANCOCIN) 500 mg in sodium chloride irrigation 0.9 % 100 mL ENEMA  Status:  Discontinued     500 mg Rectal Every 6 hours 02/25/15 0841 02/26/15 1304   02/25/15 1000  fidaxomicin (DIFICID) tablet 200 mg  Status:  Discontinued     200 mg Oral 2 times daily 02/25/15 0859 02/25/15 0901   02/25/15 1000  metroNIDAZOLE (FLAGYL) IVPB 500 mg  Status:  Discontinued     500 mg 100 mL/hr over 60 Minutes Intravenous Every 8 hours 02/25/15 0901  03/01/15 1551   02/22/15 1800  vancomycin (VANCOCIN) 50 mg/mL oral solution 125 mg  Status:  Discontinued     125 mg Oral 4 times per day 02/22/15 1537 02/25/15 0900      Subjective:    Stacy Mcguire reports diarrhea improving, more formed stools, tolerating liquids  Objective:    Filed Vitals:   03/02/15 0550 03/02/15 1435 03/02/15 2223 03/03/15 0609  BP:  140/74 140/72 138/65  Pulse:  97 83 82  Temp:  98.1 F (36.7 C) 98.2 F (36.8 C) 97.5 F (36.4 C)  TempSrc:  Oral Oral Oral  Resp:  19 18 18   Height:      Weight: 57.3 kg (126 lb 5.2 oz)   50 kg (110 lb 3.7 oz)  SpO2:  96% 95% 97%    Intake/Output Summary  (Last 24 hours) at 03/03/15 1053 Last data filed at 03/03/15 0600  Gross per 24 hour  Intake    110 ml  Output      0 ml  Net    110 ml   Filed Weights   03/01/15 0558 03/02/15 0550 03/03/15 0609  Weight: 51.3 kg (113 lb 1.5 oz) 57.3 kg (126 lb 5.2 oz) 50 kg (110 lb 3.7 oz)    Exam: Gen:  NAD Cardiovascular:  RRR, No M/R/G Respiratory:  Lungs CTAB Gastrointestinal:  Abdomen soft,non tender + BS Extremities:  No C/E/C   Data Reviewed:    Labs: Basic Metabolic Panel:  Recent Labs Lab 02/25/15 0940 02/26/15 0610 02/27/15 0552 03/01/15 0532 03/03/15 0549  NA 131* 131* 133*  --  134*  K 2.5* 3.0* 4.5  --  3.0*  CL 102 99* 102  --  102  CO2 22 25 25   --  25  GLUCOSE 130* 130* 126*  --  106*  BUN 11 8 9   --  11  CREATININE 0.67 0.62 0.79 0.69 0.82  CALCIUM 8.2* 8.1* 8.4*  --  8.7*   GFR Estimated Creatinine Clearance: 40.3 mL/min (by C-G formula based on Cr of 0.82). Liver Function Tests: No results for input(s): AST, ALT, ALKPHOS, BILITOT, PROT, ALBUMIN in the last 168 hours. No results for input(s): LIPASE, AMYLASE in the last 168 hours. No results for input(s): AMMONIA in the last 168 hours. Coagulation profile No results for input(s): INR, PROTIME in the last 168 hours.  CBC:  Recent Labs Lab 02/25/15 0940 02/27/15 0552 03/03/15 0549  WBC 13.1* 9.7 11.5*  HGB 10.5* 10.3* 10.8*  HCT 31.0* 31.1* 32.0*  MCV 81.8 81.4 81.4  PLT 442* 414* 402*   Cardiac Enzymes: No results for input(s): CKTOTAL, CKMB, CKMBINDEX, TROPONINI in the last 168 hours. BNP (last 3 results) No results for input(s): PROBNP in the last 8760 hours. CBG: No results for input(s): GLUCAP in the last 168 hours. D-Dimer: No results for input(s): DDIMER in the last 72 hours. Hgb A1c: No results for input(s): HGBA1C in the last 72 hours. Lipid Profile: No results for input(s): CHOL, HDL, LDLCALC, TRIG, CHOLHDL, LDLDIRECT in the last 72 hours. Thyroid function studies: No results for  input(s): TSH, T4TOTAL, T3FREE, THYROIDAB in the last 72 hours.  Invalid input(s): FREET3 Anemia work up: No results for input(s): VITAMINB12, FOLATE, FERRITIN, TIBC, IRON, RETICCTPCT in the last 72 hours. Sepsis Labs:  Recent Labs Lab 02/25/15 0940 02/27/15 0552 03/03/15 0549  WBC 13.1* 9.7 11.5*   Microbiology Recent Results (from the past 240 hour(s))  C difficile quick scan w PCR reflex  Status: Abnormal   Collection Time: 02/22/15  3:22 PM  Result Value Ref Range Status   C Diff antigen POSITIVE (A) NEGATIVE Final   C Diff toxin POSITIVE (A) NEGATIVE Final   C Diff interpretation Positive for toxigenic C. difficile  Final    Comment: CRITICAL RESULT CALLED TO, READ BACK BY AND VERIFIED WITH: C HALL AT 8250 ON 09.26.2016 BY NBROOKS   Culture, blood (x 2)     Status: None   Collection Time: 02/22/15  6:30 PM  Result Value Ref Range Status   Specimen Description BLOOD RIGHT ARM  Final   Special Requests BOTTLES DRAWN AEROBIC AND ANAEROBIC 5CC  Final   Culture   Final    NO GROWTH 5 DAYS Performed at [facility]    Report Status 02/27/2015 FINAL  Final  Culture, blood (x 2)     Status: None   Collection Time: 02/22/15  6:41 PM  Result Value Ref Range Status   Specimen Description BLOOD RIGHT HAND  Final   Special Requests IN PEDIATRIC BOTTLE 3CC  Final   Culture   Final    NO GROWTH 5 DAYS Performed at [facility]    Report Status 02/27/2015 FINAL  Final     Medications:   . enoxaparin (LOVENOX) injection  40 mg Subcutaneous Q24H  . feeding supplement  1 Container Oral q morning - 10a  . feeding supplement (ENSURE ENLIVE)  237 mL Oral BID BM  . fidaxomicin  200 mg Oral BID  . mesalamine  4.8 g Oral Q breakfast  . potassium chloride  40 mEq Oral BID  . saccharomyces boulardii  250 mg Oral BID  . sertraline  50 mg Oral Daily  . sodium chloride  3 mL Intravenous Q12H   Continuous Infusions:    Time spent: 25 min   LOS: 9 days    Jazel Nimmons  [facility] Pager [phone] *Please refer to [email or web address], password [email or web address] to get updated schedule on who will round on this patient, as hospitalists switch teams weekly. If 7PM-7AM, please contact night-coverage at [email or web address], password [email or web address] for any overnight needs.  03/03/2015, 10:53 AM

## 2015-03-04 ENCOUNTER — Inpatient Hospital Stay (HOSPITAL_COMMUNITY): Payer: Medicare PPO

## 2015-03-04 DIAGNOSIS — E43 Unspecified severe protein-calorie malnutrition: Secondary | ICD-10-CM | POA: Insufficient documentation

## 2015-03-04 LAB — BASIC METABOLIC PANEL WITH GFR
Anion gap: 7 (ref 5–15)
BUN: 13 mg/dL (ref 6–20)
CO2: 21 mmol/L — ABNORMAL LOW (ref 22–32)
Calcium: 8.8 mg/dL — ABNORMAL LOW (ref 8.9–10.3)
Chloride: 105 mmol/L (ref 101–111)
Creatinine, Ser: 0.69 mg/dL (ref 0.44–1.00)
GFR calc Af Amer: >60 mL/min
GFR calc non Af Amer: >60 mL/min
Glucose, Bld: 94 mg/dL (ref 65–99)
Potassium: 4.1 mmol/L (ref 3.5–5.1)
Sodium: 133 mmol/L — ABNORMAL LOW (ref 135–145)

## 2015-03-04 LAB — CBC
HCT: 33.1 % — ABNORMAL LOW (ref 36.0–46.0)
Hemoglobin: 10.9 g/dL — ABNORMAL LOW (ref 12.0–15.0)
MCH: 27.3 pg (ref 26.0–34.0)
MCHC: 32.9 g/dL (ref 30.0–36.0)
MCV: 82.8 fL (ref 78.0–100.0)
Platelets: 442 10*3/uL — ABNORMAL HIGH (ref 150–400)
RBC: 4 MIL/uL (ref 3.87–5.11)
RDW: 16.1 % — ABNORMAL HIGH (ref 11.5–15.5)
WBC: 11.3 10*3/uL — ABNORMAL HIGH (ref 4.0–10.5)

## 2015-03-04 LAB — TROPONIN I

## 2015-03-04 MED ORDER — PANTOPRAZOLE SODIUM 40 MG PO TBEC
40.0000 mg | DELAYED_RELEASE_TABLET | Freq: Every day | ORAL | Status: DC
  Administered 2015-03-04 – 2015-03-05 (×2): 40 mg via ORAL
  Filled 2015-03-04 (×3): qty 1

## 2015-03-04 MED ORDER — VANCOMYCIN HCL 500 MG IV SOLR
500.0000 mg | Freq: Four times a day (QID) | Status: DC
  Administered 2015-03-04 – 2015-03-06 (×6): 500 mg via RECTAL
  Filled 2015-03-04 (×13): qty 500

## 2015-03-04 MED ORDER — SODIUM CHLORIDE 0.9 % IV SOLN
INTRAVENOUS | Status: DC
  Administered 2015-03-04 – 2015-03-05 (×3): via INTRAVENOUS

## 2015-03-04 NOTE — Progress Notes (Signed)
PT Cancellation Note  Patient Details Name: Stacy Mcguire MRN: [id number] DOB: 1927/06/26   Cancelled Treatment:    Reason Eval/Treat Not Completed: Pt declined to participate on today-not feeling well.    Weston Anna, MPT Pager: [phone]

## 2015-03-04 NOTE — Progress Notes (Signed)
Date:  Oct. 06, 2016 U.R. performed for needs and level of care. Will continue to follow for Case Management needs.  Velva Harman, RN, BSN, [place]   [phone]

## 2015-03-04 NOTE — Progress Notes (Addendum)
[place] for Infectious Disease   Date of Admission:  02/22/2015  Antibiotics: Dificid  Subjective: Feels worse again today and less formed stools but still better than 2 days ago  Objective: Temp:  [97.6 F (36.4 C)-97.8 F (36.6 C)] 97.6 F (36.4 C) (10/06 0550) Pulse Rate:  [85-87] 85 (10/06 0550) Resp:  [18-20] 18 (10/06 0550) BP: (126-139)/(65-72) 139/72 mmHg (10/06 0550) SpO2:  [96 %-99 %] 96 % (10/06 0550)  General: awake, alert, nad Skin: no rashes Lungs: CTA B Cor: RRR Abdomen: soft, nt, nd Ext: no edema  A comprehensive review of systems was negative except for: Constitutional: positive for anorexia  Lab Results Lab Results  Component Value Date   WBC 11.3* 03/04/2015   HGB 10.9* 03/04/2015   HCT 33.1* 03/04/2015   MCV 82.8 03/04/2015   PLT 442* 03/04/2015    Lab Results  Component Value Date   CREATININE 0.69 03/04/2015   BUN 13 03/04/2015   NA 133* 03/04/2015   K 4.1 03/04/2015   CL 105 03/04/2015   CO2 21* 03/04/2015    Lab Results  Component Value Date   ALT 37 02/22/2015   AST 33 02/22/2015   ALKPHOS 83 02/22/2015   BILITOT 0.7 02/22/2015      Microbiology: Recent Results (from the past 240 hour(s))  C difficile quick scan w PCR reflex     Status: Abnormal   Collection Time: 02/22/15  3:22 PM  Result Value Ref Range Status   C Diff antigen POSITIVE (A) NEGATIVE Final   C Diff toxin POSITIVE (A) NEGATIVE Final   C Diff interpretation Positive for toxigenic C. difficile  Final    Comment: CRITICAL RESULT CALLED TO, READ BACK BY AND VERIFIED WITH: C HALL AT 1618 ON 09.26.2016 BY NBROOKS   Culture, blood (x 2)     Status: None   Collection Time: 02/22/15  6:30 PM  Result Value Ref Range Status   Specimen Description BLOOD RIGHT ARM  Final   Special Requests BOTTLES DRAWN AEROBIC AND ANAEROBIC 5CC  Final   Culture   Final    NO GROWTH 5 DAYS Performed at [facility]    Report Status 02/27/2015 FINAL  Final    Culture, blood (x 2)     Status: None   Collection Time: 02/22/15  6:41 PM  Result Value Ref Range Status   Specimen Description BLOOD RIGHT HAND  Final   Special Requests IN PEDIATRIC BOTTLE 3CC  Final   Culture   Final    NO GROWTH 5 DAYS Performed at [facility]    Report Status 02/27/2015 FINAL  Final    Studies/Results: Dg Chest Port 1 View  03/04/2015   CLINICAL DATA:  81 year old female with chest pain. Clostridium difficile colitis. Initial encounter.  EXAM: PORTABLE CHEST 1 VIEW  COMPARISON:  Chest radiographs 02/22/2015 and earlier.  FINDINGS: Portable AP semi upright view at 0922 hours. Improved, chronically large lung volumes. Chronic hiatal hernia. Stable cardiac size and mediastinal contours. Stable apical pleural/parenchymal scarring. Otherwise, Allowing for portable technique, the lungs are clear. No pneumoperitoneum.  IMPRESSION: No acute cardiopulmonary abnormality.   Electronically Signed   By: Genevie Ann M.D.   On: 03/04/2015 09:57    Assessment/Plan:  1) C diff - improved now on Dificid but some worsening overnight.  I suspect just overall improvement but will also add vancomycin enema.     Discussed by phone with her daughter in law.  Scharlene Gloss, [place] for Infectious Disease [place] [email or web address] [phone] pager   [phone] cell 03/04/2015, 2:29 PM

## 2015-03-04 NOTE — Progress Notes (Signed)
[facility] PROGRESS NOTE    Progress Note   Stacy Mcguire YPP:[id number] DOB: August 01, 1927 DOA: 02/22/2015 PCP: Elton Sin, FNP   Brief Narrative:   Stacy Mcguire is an 81 y.o. female 81 year old with past medical history of ulcerative colitis on a PPI and frequent UTIs who completed a course of antibiotic, admitted on 02/22/2015 with chief complaint of abdominal pain and profuse diarrhea, on admission she was found to have a white count of 35 with a creatinine of 1.1. Was diagnosed with sepsis and C diff colitis, was  started on Vanocmycin empirically on admission her diarrhea did not improve. Then IV Flagyl and Vanc enemas were added with no improvement so GI was consulted who recommended an ID consult.  10/3 started on Dificid and now starting to improve  Assessment/Plan:   Sepsis due to refractory C. difficile colitis Started empirically on oral vancomycin on admission with no improvement, then switched to IV Flagyl and vancomycin enemas -due to persistence of symptoms GI and ID were consulted -KUB showed no colonic dilation. -on 10/3 started on Po FIdoxamicin per Dr.Comer,  -was starting to improve with less diarrhea and more formed stool -GI and ID signed off, but today with increase freq of diarrhea -if persists will ask GI to re-consider Flex sigmoidoscopy, add IVF today  H/o Ulcerative colitis -continue mesalamine, see above  Acute kidney injury: Due to diarrhea, now resolved. KVO IV fluids  Hyponatremia/hypokalemia: -improved, replace K.  Metabolic acidosis: Likely due to diarrhea, was on bicarb gtt on admission  Thrombocytosis ([place]) Likely reactive due to infectious etiology and possibly ulcerative colitis and or C. Dif colitis, is now improving.  Severe protein caloric malnutrition: Continue supplementation.  DVT Prophylaxis - Lovenox  Family Communication: none, called and updated daughter in law Leesa 10/5 Disposition Plan: SNF when  stable Code Status: Full Code    Code Status Orders        Start     Ordered   02/22/15 1743  Full code   Continuous     02/22/15 1742        IV Access:    Peripheral IV   Procedures and diagnostic studies:   Dg Chest Port 1 View  01-Apr-2015   CLINICAL DATA:  81 year old female with chest pain. Clostridium difficile colitis. Initial encounter.  EXAM: PORTABLE CHEST 1 VIEW  COMPARISON:  Chest radiographs 02/22/2015 and earlier.  FINDINGS: Portable AP semi upright view at 0922 hours. Improved, chronically large lung volumes. Chronic hiatal hernia. Stable cardiac size and mediastinal contours. Stable apical pleural/parenchymal scarring. Otherwise, Allowing for portable technique, the lungs are clear. No pneumoperitoneum.  IMPRESSION: No acute cardiopulmonary abnormality.   Electronically Signed   By: Genevie Ann M.D.   On: 2015/04/01 09:57     Medical Consultants:    None.  Anti-Infectives:   Anti-infectives    Start     Dose/Rate Route Frequency Ordered Stop   03/01/15 1800  fidaxomicin (DIFICID) tablet 200 mg     200 mg Oral 2 times daily 03/01/15 1551     02/26/15 1800  vancomycin (VANCOCIN) 500 mg in sodium chloride irrigation 0.9 % 100 mL ENEMA  Status:  Discontinued     500 mg Rectal 4 times per day 02/26/15 1451 03/01/15 1551   02/25/15 1200  vancomycin (VANCOCIN) 50 mg/mL oral solution 500 mg  Status:  Discontinued     500 mg Oral 4 times per day 02/25/15 0900 03/01/15 1551   02/25/15 1000  vancomycin (  VANCOCIN) 500 mg in sodium chloride irrigation 0.9 % 100 mL ENEMA  Status:  Discontinued     500 mg Rectal Every 6 hours 02/25/15 0841 02/26/15 1304   02/25/15 1000  fidaxomicin (DIFICID) tablet 200 mg  Status:  Discontinued     200 mg Oral 2 times daily 02/25/15 0859 02/25/15 0901   02/25/15 1000  metroNIDAZOLE (FLAGYL) IVPB 500 mg  Status:  Discontinued     500 mg 100 mL/hr over 60 Minutes Intravenous Every 8 hours 02/25/15 0901 03/01/15 1551   02/22/15 1800   vancomycin (VANCOCIN) 50 mg/mL oral solution 125 mg  Status:  Discontinued     125 mg Oral 4 times per day 02/22/15 1537 02/25/15 0900      Subjective:    Stacy Mcguire reports diarrhea worse again, 5-6 episodes overnight  Objective:    Filed Vitals:   03/03/15 0609 03/03/15 1500 03/03/15 2100 03/04/15 0550  BP: 138/65 126/65 129/71 139/72  Pulse: 82 87 86 85  Temp: 97.5 F (36.4 C) 97.8 F (36.6 C) 97.7 F (36.5 C) 97.6 F (36.4 C)  TempSrc: Oral Oral Oral Axillary  Resp: 18 18 20 18   Height:      Weight: 50 kg (110 lb 3.7 oz)     SpO2: 97% 97% 99% 96%    Intake/Output Summary (Last 24 hours) at 03/04/15 1348 Last data filed at 03/04/15 0900  Gross per 24 hour  Intake     50 ml  Output      0 ml  Net     50 ml   Filed Weights   03/01/15 0558 03/02/15 0550 03/03/15 0609  Weight: 51.3 kg (113 lb 1.5 oz) 57.3 kg (126 lb 5.2 oz) 50 kg (110 lb 3.7 oz)    Exam: Gen:  NAD Cardiovascular:  RRR, No M/R/G Respiratory:  Lungs CTAB Gastrointestinal:  Abdomen soft,non tender + BS Extremities:  No C/E/C   Data Reviewed:    Labs: Basic Metabolic Panel:  Recent Labs Lab 02/26/15 0610 02/27/15 0552 03/01/15 0532 03/03/15 0549 03/04/15 0520  NA 131* 133*  --  134* 133*  K 3.0* 4.5  --  3.0* 4.1  CL 99* 102  --  102 105  CO2 25 25  --  25 21*  GLUCOSE 130* 126*  --  106* 94  BUN 8 9  --  11 13  CREATININE 0.62 0.79 0.69 0.82 0.69  CALCIUM 8.1* 8.4*  --  8.7* 8.8*   GFR Estimated Creatinine Clearance: 41.3 mL/min (by C-G formula based on Cr of 0.69). Liver Function Tests: No results for input(s): AST, ALT, ALKPHOS, BILITOT, PROT, ALBUMIN in the last 168 hours. No results for input(s): LIPASE, AMYLASE in the last 168 hours. No results for input(s): AMMONIA in the last 168 hours. Coagulation profile No results for input(s): INR, PROTIME in the last 168 hours.  CBC:  Recent Labs Lab 02/27/15 0552 03/03/15 0549 03/04/15 0520  WBC 9.7 11.5* 11.3*  HGB  10.3* 10.8* 10.9*  HCT 31.1* 32.0* 33.1*  MCV 81.4 81.4 82.8  PLT 414* 402* 442*   Cardiac Enzymes:  Recent Labs Lab 03/04/15 1019  TROPONINI <0.03   BNP (last 3 results) No results for input(s): PROBNP in the last 8760 hours. CBG: No results for input(s): GLUCAP in the last 168 hours. D-Dimer: No results for input(s): DDIMER in the last 72 hours. Hgb A1c: No results for input(s): HGBA1C in the last 72 hours. Lipid Profile: No results for  input(s): CHOL, HDL, LDLCALC, TRIG, CHOLHDL, LDLDIRECT in the last 72 hours. Thyroid function studies: No results for input(s): TSH, T4TOTAL, T3FREE, THYROIDAB in the last 72 hours.  Invalid input(s): FREET3 Anemia work up: No results for input(s): VITAMINB12, FOLATE, FERRITIN, TIBC, IRON, RETICCTPCT in the last 72 hours. Sepsis Labs:  Recent Labs Lab 02/27/15 0552 03/03/15 0549 03/04/15 0520  WBC 9.7 11.5* 11.3*   Microbiology Recent Results (from the past 240 hour(s))  C difficile quick scan w PCR reflex     Status: Abnormal   Collection Time: 02/22/15  3:22 PM  Result Value Ref Range Status   C Diff antigen POSITIVE (A) NEGATIVE Final   C Diff toxin POSITIVE (A) NEGATIVE Final   C Diff interpretation Positive for toxigenic C. difficile  Final    Comment: CRITICAL RESULT CALLED TO, READ BACK BY AND VERIFIED WITH: C HALL AT 4174 ON 09.26.2016 BY NBROOKS   Culture, blood (x 2)     Status: None   Collection Time: 02/22/15  6:30 PM  Result Value Ref Range Status   Specimen Description BLOOD RIGHT ARM  Final   Special Requests BOTTLES DRAWN AEROBIC AND ANAEROBIC 5CC  Final   Culture   Final    NO GROWTH 5 DAYS Performed at [facility]    Report Status 02/27/2015 FINAL  Final  Culture, blood (x 2)     Status: None   Collection Time: 02/22/15  6:41 PM  Result Value Ref Range Status   Specimen Description BLOOD RIGHT HAND  Final   Special Requests IN PEDIATRIC BOTTLE 3CC  Final   Culture   Final    NO GROWTH 5  DAYS Performed at [facility]    Report Status 02/27/2015 FINAL  Final     Medications:   . enoxaparin (LOVENOX) injection  40 mg Subcutaneous Q24H  . fidaxomicin  200 mg Oral BID  . mesalamine  4.8 g Oral Q breakfast  . pantoprazole  40 mg Oral Q1200  . saccharomyces boulardii  250 mg Oral BID  . sertraline  50 mg Oral Daily  . sodium chloride  3 mL Intravenous Q12H   Continuous Infusions: . sodium chloride      Time spent: 25 min   LOS: 10 days   Viney Acocella  [facility] Pager [phone] *Please refer to [email or web address], password [email or web address] to get updated schedule on who will round on this patient, as hospitalists switch teams weekly. If 7PM-7AM, please contact night-coverage at [email or web address], password [email or web address] for any overnight needs.  03/04/2015, 1:48 PM

## 2015-03-05 LAB — CBC
HCT: 30.6 % — ABNORMAL LOW (ref 36.0–46.0)
Hemoglobin: 10.2 g/dL — ABNORMAL LOW (ref 12.0–15.0)
MCH: 27.6 pg (ref 26.0–34.0)
MCHC: 33.3 g/dL (ref 30.0–36.0)
MCV: 82.7 fL (ref 78.0–100.0)
PLATELETS: 420 10*3/uL — AB (ref 150–400)
RBC: 3.7 MIL/uL — AB (ref 3.87–5.11)
RDW: 16.4 % — ABNORMAL HIGH (ref 11.5–15.5)
WBC: 8.3 10*3/uL (ref 4.0–10.5)

## 2015-03-05 LAB — BASIC METABOLIC PANEL
Anion gap: 7 (ref 5–15)
BUN: 10 mg/dL (ref 6–20)
CO2: 20 mmol/L — ABNORMAL LOW (ref 22–32)
Calcium: 8.5 mg/dL — ABNORMAL LOW (ref 8.9–10.3)
Chloride: 104 mmol/L (ref 101–111)
Creatinine, Ser: 0.72 mg/dL (ref 0.44–1.00)
GFR calc non Af Amer: >60 mL/min (ref >60)
Glucose, Bld: 93 mg/dL (ref 65–99)
Potassium: 3.3 mmol/L — ABNORMAL LOW (ref 3.5–5.1)
SODIUM: 131 mmol/L — AB (ref 135–145)

## 2015-03-05 MED ORDER — POTASSIUM CHLORIDE CRYS ER 20 MEQ PO TBCR
40.0000 meq | EXTENDED_RELEASE_TABLET | Freq: Once | ORAL | Status: AC
  Administered 2015-03-05: 40 meq via ORAL
  Filled 2015-03-05: qty 2

## 2015-03-05 NOTE — Progress Notes (Signed)
    [facility] for Infectious Disease   Date of Admission:  02/22/2015  Antibiotics: Dificid  Subjective: Better today than yesterday, less diarrhea  Objective: Temp:  [97.3 F (36.3 C)-98.2 F (36.8 C)] 98.1 F (36.7 C) (10/07 1447) Pulse Rate:  [84-96] 84 (10/07 1447) Resp:  [20] 20 (10/07 1020) BP: (122-142)/(46-74) 138/64 mmHg (10/07 1447) SpO2:  [96 %-98 %] 98 % (10/07 1447)  General: awake, alert, nad Skin: no rashes Lungs: CTA B Cor: RRR Abdomen: soft, nt, nd Ext: no edema  A comprehensive review of systems was negative except for: Constitutional: positive for anorexia  Lab Results Lab Results  Component Value Date   WBC 8.3 03/05/2015   HGB 10.2* 03/05/2015   HCT 30.6* 03/05/2015   MCV 82.7 03/05/2015   PLT 420* 03/05/2015    Lab Results  Component Value Date   CREATININE 0.72 03/05/2015   BUN 10 03/05/2015   NA 131* 03/05/2015   K 3.3* 03/05/2015   CL 104 03/05/2015   CO2 20* 03/05/2015    Lab Results  Component Value Date   ALT 37 02/22/2015   AST 33 02/22/2015   ALKPHOS 83 02/22/2015   BILITOT 0.7 02/22/2015      Microbiology: No results found for this or any previous visit (from the past 240 hour(s)).  Studies/Results: Dg Chest Port 1 View  03/04/2015   CLINICAL DATA:  81 year old female with chest pain. Clostridium difficile colitis. Initial encounter.  EXAM: PORTABLE CHEST 1 VIEW  COMPARISON:  Chest radiographs 02/22/2015 and earlier.  FINDINGS: Portable AP semi upright view at 0922 hours. Improved, chronically large lung volumes. Chronic hiatal hernia. Stable cardiac size and mediastinal contours. Stable apical pleural/parenchymal scarring. Otherwise, Allowing for portable technique, the lungs are clear. No pneumoperitoneum.  IMPRESSION: No acute cardiopulmonary abnormality.   Electronically Signed   By: Genevie Ann M.D.   On: 03/04/2015 09:57    Assessment/Plan:  1) C diff - improved now on Dificid and improved with enema.  Can  continue enema for another 1-2 days if improvement continues.  Dificid for 14 days total.    I am available over the weekend if needed.    Scharlene Gloss, [place] for Infectious Disease [place] [email or web address] [phone] pager   [phone] cell 03/05/2015, 2:53 PM

## 2015-03-05 NOTE — Progress Notes (Signed)
[facility] PROGRESS NOTE    Progress Note   Stacy Mcguire SEG:[id number] DOB: May 08, 1928 DOA: 02/22/2015 PCP: Elton Sin, FNP   Brief Narrative:   Stacy Mcguire is an 81 y.o. female 81 year old with past medical history of ulcerative colitis on a PPI and frequent UTIs who completed a course of antibiotic, admitted on 02/22/2015 with chief complaint of abdominal pain and profuse diarrhea, on admission she was found to have a white count of 35 with a creatinine of 1.1. Was diagnosed with sepsis and C diff colitis, was  started on Vanocmycin empirically on admission her diarrhea did not improve. Then IV Flagyl and Vanc enemas were added with no improvement so GI was consulted who recommended an ID consult.  10/3 started on Dificid and now starting to improve  Assessment/Plan:   Sepsis due to refractory C. difficile colitis Started empirically on oral vancomycin on admission with no improvement, then switched to IV Flagyl and vancomycin enemas -due to persistence of symptoms GI and ID were consulted -KUB showed no colonic dilation. -on 10/3 started on Po FIdoxamicin per Dr.Comer,  -was starting to improve with less diarrhea and more formed stool -GI and ID signed off, but 03-26-2023 with increase freq of diarrhea, Vanc enema re-added -Diarrhea improving again  H/o Ulcerative colitis -continue mesalamine, see above  Acute kidney injury: Due to diarrhea, now resolved. KVO IV fluids  Hyponatremia/hypokalemia: -improved, replace K.  Metabolic acidosis: Likely due to diarrhea, was on bicarb gtt on admission  Thrombocytosis ([place]) Likely reactive due to infectious etiology and possibly ulcerative colitis and or C. Dif colitis, is now improving.  Severe protein caloric malnutrition: Continue supplementation.  DVT Prophylaxis - Lovenox  Family Communication: none, called and updated daughter in law Leesa 10/5 Disposition Plan: SNF when stable, ? tomorrow Code Status: Full  Code    Code Status Orders        Start     Ordered   02/22/15 1743  Full code   Continuous     02/22/15 1742        IV Access:    Peripheral IV   Procedures and diagnostic studies:   Dg Chest Port 1 View  Mar 26, 2015   CLINICAL DATA:  81 year old female with chest pain. Clostridium difficile colitis. Initial encounter.  EXAM: PORTABLE CHEST 1 VIEW  COMPARISON:  Chest radiographs 02/22/2015 and earlier.  FINDINGS: Portable AP semi upright view at 0922 hours. Improved, chronically large lung volumes. Chronic hiatal hernia. Stable cardiac size and mediastinal contours. Stable apical pleural/parenchymal scarring. Otherwise, Allowing for portable technique, the lungs are clear. No pneumoperitoneum.  IMPRESSION: No acute cardiopulmonary abnormality.   Electronically Signed   By: Genevie Ann M.D.   On: 26-Mar-2015 09:57     Medical Consultants:    None.  Anti-Infectives:   Anti-infectives    Start     Dose/Rate Route Frequency Ordered Stop   2015-03-26 1530  vancomycin (VANCOCIN) 500 mg in sodium chloride irrigation 0.9 % 100 mL ENEMA     500 mg Rectal 4 times per day 03-26-2015 1432     03/01/15 1800  fidaxomicin (DIFICID) tablet 200 mg     200 mg Oral 2 times daily 03/01/15 1551     02/26/15 1800  vancomycin (VANCOCIN) 500 mg in sodium chloride irrigation 0.9 % 100 mL ENEMA  Status:  Discontinued     500 mg Rectal 4 times per day 02/26/15 1451 03/01/15 1551   02/25/15 1200  vancomycin (VANCOCIN) 50 mg/mL  oral solution 500 mg  Status:  Discontinued     500 mg Oral 4 times per day 02/25/15 0900 03/01/15 1551   02/25/15 1000  vancomycin (VANCOCIN) 500 mg in sodium chloride irrigation 0.9 % 100 mL ENEMA  Status:  Discontinued     500 mg Rectal Every 6 hours 02/25/15 0841 02/26/15 1304   02/25/15 1000  fidaxomicin (DIFICID) tablet 200 mg  Status:  Discontinued     200 mg Oral 2 times daily 02/25/15 0859 02/25/15 0901   02/25/15 1000  metroNIDAZOLE (FLAGYL) IVPB 500 mg  Status:   Discontinued     500 mg 100 mL/hr over 60 Minutes Intravenous Every 8 hours 02/25/15 0901 03/01/15 1551   02/22/15 1800  vancomycin (VANCOCIN) 50 mg/mL oral solution 125 mg  Status:  Discontinued     125 mg Oral 4 times per day 02/22/15 1537 02/25/15 0900      Subjective:    Stacy Mcguire reports diarrhea improving, 2 episodes since last night  Objective:    Filed Vitals:   03/04/15 1830 03/04/15 2355 03/05/15 0510 03/05/15 1020  BP: 138/74 142/70 138/73 122/46  Pulse: 96 92 89 91  Temp: 97.7 F (36.5 C) 98.2 F (36.8 C) 97.5 F (36.4 C) 97.3 F (36.3 C)  TempSrc: Oral Oral Oral Oral  Resp: 20 20 20 20   Height:      Weight:      SpO2: 98% 98% 96% 98%    Intake/Output Summary (Last 24 hours) at 03/05/15 1141 Last data filed at 03/05/15 0700  Gross per 24 hour  Intake   1285 ml  Output      0 ml  Net   1285 ml   Filed Weights   03/01/15 0558 03/02/15 0550 03/03/15 0609  Weight: 51.3 kg (113 lb 1.5 oz) 57.3 kg (126 lb 5.2 oz) 50 kg (110 lb 3.7 oz)    Exam: Gen:  NAD Cardiovascular:  RRR, No M/R/G Respiratory:  Lungs CTAB Gastrointestinal:  Abdomen soft,non tender + BS Extremities:  No C/E/C   Data Reviewed:    Labs: Basic Metabolic Panel:  Recent Labs Lab 02/27/15 0552 03/01/15 0532 03/03/15 0549 03/04/15 0520 03/05/15 0515  NA 133*  --  134* 133* 131*  K 4.5  --  3.0* 4.1 3.3*  CL 102  --  102 105 104  CO2 25  --  25 21* 20*  GLUCOSE 126*  --  106* 94 93  BUN 9  --  11 13 10   CREATININE 0.79 0.69 0.82 0.69 0.72  CALCIUM 8.4*  --  8.7* 8.8* 8.5*   GFR Estimated Creatinine Clearance: 41.3 mL/min (by C-G formula based on Cr of 0.72). Liver Function Tests: No results for input(s): AST, ALT, ALKPHOS, BILITOT, PROT, ALBUMIN in the last 168 hours. No results for input(s): LIPASE, AMYLASE in the last 168 hours. No results for input(s): AMMONIA in the last 168 hours. Coagulation profile No results for input(s): INR, PROTIME in the last 168  hours.  CBC:  Recent Labs Lab 02/27/15 0552 03/03/15 0549 03/04/15 0520 03/05/15 0515  WBC 9.7 11.5* 11.3* 8.3  HGB 10.3* 10.8* 10.9* 10.2*  HCT 31.1* 32.0* 33.1* 30.6*  MCV 81.4 81.4 82.8 82.7  PLT 414* 402* 442* 420*   Cardiac Enzymes:  Recent Labs Lab 03/04/15 1019 03/04/15 1540  TROPONINI <0.03 <0.03   BNP (last 3 results) No results for input(s): PROBNP in the last 8760 hours. CBG: No results for input(s): GLUCAP in the  last 168 hours. D-Dimer: No results for input(s): DDIMER in the last 72 hours. Hgb A1c: No results for input(s): HGBA1C in the last 72 hours. Lipid Profile: No results for input(s): CHOL, HDL, LDLCALC, TRIG, CHOLHDL, LDLDIRECT in the last 72 hours. Thyroid function studies: No results for input(s): TSH, T4TOTAL, T3FREE, THYROIDAB in the last 72 hours.  Invalid input(s): FREET3 Anemia work up: No results for input(s): VITAMINB12, FOLATE, FERRITIN, TIBC, IRON, RETICCTPCT in the last 72 hours. Sepsis Labs:  Recent Labs Lab 02/27/15 0552 03/03/15 0549 03/04/15 0520 03/05/15 0515  WBC 9.7 11.5* 11.3* 8.3   Microbiology No results found for this or any previous visit (from the past 240 hour(s)).   Medications:   . enoxaparin (LOVENOX) injection  40 mg Subcutaneous Q24H  . fidaxomicin  200 mg Oral BID  . mesalamine  4.8 g Oral Q breakfast  . pantoprazole  40 mg Oral Q1200  . saccharomyces boulardii  250 mg Oral BID  . sertraline  50 mg Oral Daily  . sodium chloride  3 mL Intravenous Q12H  . vancomycin (VANCOCIN) rectal ENEMA  500 mg Rectal 4 times per day   Continuous Infusions: . sodium chloride 75 mL/hr at 03/05/15 0207    Time spent: 25 min   LOS: 11 days   [facility]  [facility] Pager [phone] *Please refer to [email or web address], password [email or web address] to get updated schedule on who will round on this patient, as hospitalists switch teams weekly. If 7PM-7AM, please contact night-coverage at [email or web address], password [email or web address] for any  overnight needs.  03/05/2015, 11:41 AM

## 2015-03-05 NOTE — Clinical Social Work Note (Signed)
Pt has been accepted at [facility] and can be admitted on Saturday is medically stable.  The room number is 153 and facility contact is Hilbert Corrigan [phone].  [facility] is the only facility available to accept pt.  CSW will inform pt and family.  CSW will continue to follow and assist with d/c planning needs.  [place], [place]

## 2015-03-05 NOTE — Progress Notes (Signed)
Physical Therapy Treatment Patient Details Name: Stacy Mcguire MRN: [id number] DOB: June 16, 1927 Today's Date: 03/05/2015    History of Present Illness 81 y.o. female admitted with diarrhea. Dx of c diff, sepsis, ARF.     PT Comments    Pt continues to be weak. Participated well with session. Encouraged washing of hands.   Follow Up Recommendations  SNF     Equipment Recommendations       Recommendations for Other Services       Precautions / Restrictions Precautions Precautions: Fall Restrictions Weight Bearing Restrictions: No    Mobility  Bed Mobility Overal bed mobility: Needs Assistance Bed Mobility: Supine to Sit;Sit to Supine     Supine to sit: Min assist Sit to supine: Min assist   General bed mobility comments: assist for trunk, LEs. Increased time.   Transfers Overall transfer level: Needs assistance Equipment used: Rolling walker (2 wheeled) Transfers: Sit to/from [facility] Sit to Stand: Min assist Stand pivot transfers: Min assist       General transfer comment: Assist to rise, stabilize, control descent. stand pivot bed to bsc  Ambulation/Gait Ambulation/Gait assistance: Min guard Ambulation Distance (Feet): 40 Feet Assistive device: Rolling walker (2 wheeled) Gait Pattern/deviations: Step-through pattern;Decreased stride length;Trunk flexed     General Gait Details: close guard for safety. VCs safety.    Stairs            Wheelchair Mobility    Modified Rankin (Stroke Patients Only)       Balance                                    Cognition Arousal/Alertness: Awake/alert Behavior During Therapy: WFL for tasks assessed/performed Overall Cognitive Status: Within Functional Limits for tasks assessed                      Exercises      General Comments        Pertinent Vitals/Pain Pain Assessment: No/denies pain    Home Living                      Prior Function             PT Goals (current goals can now be found in the care plan section) Progress towards PT goals: Progressing toward goals    Frequency  Min 3X/week    PT Plan Current plan remains appropriate    Co-evaluation             End of Session   Activity Tolerance: Patient tolerated treatment well Patient left: in bed;with call bell/phone within reach;with bed alarm set     Time: 1829-9371 PT Time Calculation (min) (ACUTE ONLY): 18 min  Charges:  $Gait Training: 8-22 mins                    G Codes:      Weston Anna, MPT Pager: [phone]

## 2015-03-05 NOTE — Clinical Social Work Note (Signed)
CSW spoke to admissions coordinator at [facility] who explained that [facility] authorized SNF until Monday for [facility] only.  Admission coordinator explained that [facility] would not be able to provide services to pt due to not being able to cover the cost of Dificid.  The facility explained that the medication cost $3500 for 20 pills.  CSW referred pt to [facility] and [facility] to determine if they would be able to extend a bed offer.  [facility] was not able to take pt due to no private rooms available.  [facility] is still reviewing pt.   CSW will continue to follow and assist with d/c planning needs.  [place], [place]

## 2015-03-06 LAB — BASIC METABOLIC PANEL
Anion gap: 8 (ref 5–15)
BUN: 13 mg/dL (ref 6–20)
CALCIUM: 8.4 mg/dL — AB (ref 8.9–10.3)
CO2: 21 mmol/L — AB (ref 22–32)
Chloride: 105 mmol/L (ref 101–111)
Creatinine, Ser: 0.7 mg/dL (ref 0.44–1.00)
Glucose, Bld: 88 mg/dL (ref 65–99)
Potassium: 3.3 mmol/L — ABNORMAL LOW (ref 3.5–5.1)
Sodium: 134 mmol/L — ABNORMAL LOW (ref 135–145)

## 2015-03-06 LAB — CBC
HCT: 30.1 % — ABNORMAL LOW (ref 36.0–46.0)
Hemoglobin: 10 g/dL — ABNORMAL LOW (ref 12.0–15.0)
MCH: 27.5 pg (ref 26.0–34.0)
MCHC: 33.2 g/dL (ref 30.0–36.0)
MCV: 82.9 fL (ref 78.0–100.0)
PLATELETS: 414 10*3/uL — AB (ref 150–400)
RBC: 3.63 MIL/uL — ABNORMAL LOW (ref 3.87–5.11)
RDW: 16.5 % — AB (ref 11.5–15.5)
WBC: 7.1 10*3/uL (ref 4.0–10.5)

## 2015-03-06 MED ORDER — DIAZEPAM 2 MG PO TABS: 2.0000 mg | ORAL_TABLET | Freq: Every evening | ORAL | Status: DC | PRN

## 2015-03-06 MED ORDER — FIDAXOMICIN 200 MG PO TABS: 200.0000 mg | ORAL_TABLET | Freq: Two times a day (BID) | ORAL | Status: DC

## 2015-03-06 MED ORDER — TRAMADOL HCL 50 MG PO TABS: 50.0000 mg | ORAL_TABLET | ORAL | Status: DC | PRN

## 2015-03-06 MED ORDER — MESALAMINE 1.2 G PO TBEC: 4.8000 g | DELAYED_RELEASE_TABLET | Freq: Every day | ORAL | Status: DC

## 2015-03-06 MED ORDER — POTASSIUM CHLORIDE CRYS ER 20 MEQ PO TBCR
40.0000 meq | EXTENDED_RELEASE_TABLET | Freq: Once | ORAL | Status: AC
  Administered 2015-03-06: 40 meq via ORAL
  Filled 2015-03-06: qty 2

## 2015-03-06 NOTE — Clinical Social Work Placement (Signed)
   CLINICAL SOCIAL WORK PLACEMENT  NOTE  Date:  03/06/2015  Patient Details  Name: Stacy Mcguire MRN: [id number] Date of Birth: [birth year]  Clinical Social Work is seeking post-discharge placement for this patient at the [place] level of care (*CSW will initial, date and re-position this form in  chart as items are completed):  Yes   Patient/family provided with [facility] Work Department's list of facilities offering this level of care within the geographic area requested by the patient (or if unable, by the patient's family).  Yes   Patient/family informed of their freedom to choose among providers that offer the needed level of care, that participate in Medicare, Medicaid or managed care program needed by the patient, have an available bed and are willing to accept the patient.  Yes   Patient/family informed of [facility]'s ownership interest in [facility] and [facility], as well as of the fact that they are under no obligation to receive care at these facilities.  PASRR submitted to EDS on 02/24/15     PASRR number received on 02/24/15     Existing PASRR number confirmed on       FL2 transmitted to all facilities in geographic area requested by pt/family on 02/24/15     FL2 transmitted to all facilities within larger geographic area on       Patient informed that his/her managed care company has contracts with or will negotiate with certain facilities, including the following:        Yes   Patient/family informed of bed offers received.  Patient chooses bed at   [facility]    Physician recommends and patient chooses bed at      Patient to be transferred to  [facility] on  .March 06, 2015  Patient to be transferred to facility by   ambulance    Patient family notified on   March 06, 2015 of transfer.  Name of family member notified:   Lattie Haw and Richardson Landry (pt's son and daughter in law)     PHYSICIAN Please  sign FL2     Additional Comment:    _______________________________________________ Carlean Jews, LCSW 03/06/2015, 11:57 AM

## 2015-03-06 NOTE — Discharge Summary (Signed)
Physician Discharge Summary  Stacy Mcguire OTR:[id number] DOB: 04/25/1928 DOA: 02/22/2015  PCP: Elton Sin, FNP  Admit date: 02/22/2015 Discharge date: 03/06/2015  Time spent: 45 minutes  Recommendations for Outpatient Follow-up:  1. PCP in 1 week 2. Dr.Outlaw (GI)  in 2 weeks  Discharge Diagnoses:  Principal Problem:   Severe C. difficile colitis Active Problems:   Weakness generalized   Diarrhea   Sepsis ([place])   Acute renal failure (HCC)   Acute hyponatremia   Thrombocytosis (HCC)   Metabolic acidosis   Hypokalemia   Abdominal pain, generalized   Protein-calorie malnutrition, severe ([place])   H/o Ulcerative colitis  Discharge Condition: stable  Diet recommendation: bland diet, limit dairy for few days  Filed Weights   03/01/15 0558 03/02/15 0550 03/03/15 0609  Weight: 51.3 kg (113 lb 1.5 oz) 57.3 kg (126 lb 5.2 oz) 50 kg (110 lb 3.7 oz)    History of present illness:  Chief Complaint: abd pain and watery diarrhea  HPI: 81 yo female who presented to [facility] ED with main concern of several days duration of progressively worsening watery diarrhea over 10 episodes per day, associated with abd cramps, intermittent in nature and involving lower abd quadrants, occasionally radiating to the entire abdomen, associated with poor oral intake and malaise. She was recently treated with ABX for UTI.  In ED, pt noted to be hemodynamically stable, VS notable for HR up to 110, blood work notable for WBC 35 K  Hospital Course:  Sepsis due to refractory C. difficile colitis Started empirically on oral vancomycin on admission with no improvement, then switched to IV Flagyl and vancomycin enemas -due to persistence of symptoms GI and ID were consulted -KUB showed no colonic dilation. -on 10/3 started on Po FIdoxamicin per Dr.Comer(infectious disease) -was starting to improve with less diarrhea and more formed stool -but then again on 10/5 had increase freq of diarrhea, Vanc enema  re-added -last 48hours with improving diarrhea again, WBC has normalized, clinically stable for discharge to SNF will need 9 more days of Fidoxamicin to complete 14days Rx  H/o Ulcerative colitis -continue mesalamine, FU with Gi Dr.Outlaw  Acute kidney injury: Due to volume depletion from diarrhea, now resolved.  Hyponatremia/hypokalemia: -improved, replaced K.  Metabolic acidosis: -due to diarrhea, was on bicarb gtt on admission -improved  Thrombocytosis ([place]) Reactive due to infectious etiology, now improving.  Severe protein caloric malnutrition: Supplements added  Debility/deconditioning, from above -Short term rehab at discharge   Consultations:  Dr.Comer ID  Dr.Outlaw GI  Discharge Exam: Filed Vitals:   03/06/15 0700  BP: 131/70  Pulse: 88  Temp: 97.4 F (36.3 C)  Resp: 20    General: AAOx3 Cardiovascular: S1S2/RRR Respiratory: CTAB  Discharge Instructions   Discharge Instructions    Diet - low sodium heart healthy    Complete by:  As directed      Increase activity slowly    Complete by:  As directed           Current Discharge Medication List    START taking these medications   Details  fidaxomicin (DIFICID) 200 MG TABS tablet Take 1 tablet (200 mg total) by mouth 2 (two) times daily. For 9 days Qty: 18 tablet, Refills: 0    mesalamine (LIALDA) 1.2 G EC tablet Take 4 tablets (4.8 g total) by mouth daily with breakfast.      CONTINUE these medications which have CHANGED   Details  diazepam (VALIUM) 2 MG tablet Take 1 tablet (2  mg total) by mouth at bedtime as needed for sedation (for sleep). Qty: 10 tablet, Refills: 0    traMADol (ULTRAM) 50 MG tablet Take 1 tablet (50 mg total) by mouth every 4 (four) hours as needed. for pain Qty: 30 tablet, Refills: 0      CONTINUE these medications which have NOT CHANGED   Details  acetaminophen (TYLENOL) 325 MG tablet Take 2 tablets (650 mg total) by mouth every 6 (six) hours as needed for mild  pain (or Fever >/= 101).    hydrocortisone cream 0.5 % Apply 1 application topically 2 (two) times daily as needed for itching.    Loperamide HCl (ANTI-DIARRHEAL PO) Take 2 tablets by mouth daily as needed (diarrhea).    ondansetron (ZOFRAN) 4 MG tablet Take 1 tablet (4 mg total) by mouth daily as needed for nausea. Qty: 30 tablet, Refills: 1    Probiotic Product (ALIGN PO) Take 1 capsule by mouth daily.    sertraline (ZOLOFT) 50 MG tablet Take 50 mg by mouth daily.  Refills: 1    Vitamin D, Ergocalciferol, (DRISDOL) 50000 UNITS CAPS capsule Take 50,000 Units by mouth daily.      STOP taking these medications     ENSURE (ENSURE)      omeprazole (PRILOSEC) 20 MG capsule      psyllium (METAMUCIL) 58.6 % powder        Allergies  Allergen Reactions  . Neosporin Af [Miconazole] Itching   Follow-up Information    Follow up with Elton Sin, FNP. Schedule an appointment as soon as possible for a visit in 1 week.   Specialty:  Nurse Practitioner   Contact information:   [address] [place] [phone]        The results of significant diagnostics from this hospitalization (including imaging, microbiology, ancillary and laboratory) are listed below for reference.    Significant Diagnostic Studies: Dg Abd 1 View  02/28/2015   CLINICAL DATA:  Abdominal pain.  History of ulcerative colitis.  EXAM: ABDOMEN - 1 VIEW  COMPARISON:  February 22, 2015  FINDINGS: There is no bowel dilatation or air-fluid level suggesting obstruction. No free air or portal venous air. Lung bases are clear. There is lumbar dextroscoliosis with extensive degenerative type change in the lumbar spine. There is sacroiliitis on the left. Much milder sacroiliitis is noted on the right.  IMPRESSION: Bowel gas pattern unremarkable. Sacroiliitis bilaterally, considerably more severe on the left than on the right. Arthropathy in the lumbar spine with dextroscoliosis. Lung bases clear.    Electronically Signed   By: Lowella Grip III M.D.   On: 02/28/2015 11:13   Ct Abdomen Pelvis W Contrast  02/22/2015   CLINICAL DATA:  Diarrhea and abdominal pain for 3-4 months.  EXAM: CT ABDOMEN AND PELVIS WITH CONTRAST  TECHNIQUE: Multidetector CT imaging of the abdomen and pelvis was performed using the standard protocol following bolus administration of intravenous contrast.  CONTRAST:  33mL OMNIPAQUE IOHEXOL 300 MG/ML SOLN, 7mL OMNIPAQUE IOHEXOL 300 MG/ML SOLN  COMPARISON:  01/29/2015  FINDINGS: Lower chest: No pleural or pericardial effusion identified. The lung bases are clear.  Hepatobiliary: No suspicious liver abnormality. Large cyst within left lobe is again noted measuring 8.2 cm on today's exam. Stable from previous exam. The gallbladder is normal. There is no biliary dilatation identified.  Pancreas: Unremarkable appearance of the pancreas.  Spleen: The spleen appears normal.  Adrenals/Urinary Tract: The adrenal glands are both normal. Small cysts are  noted in both kidneys. The urinary bladder appears normal.  Stomach/Bowel: There is a large hiatal hernia noted. The small bowel loops have a normal course and caliber. No obstruction. From the distal transverse colon to the rectum there is marked wall thickening, mucosal enhancement and inflammation compatible with colitis. No pneumatosis. No perforation or abscess.  Vascular/Lymphatic: Calcified atherosclerotic disease involves the abdominal aorta. No aneurysm. No enlarged retroperitoneal or mesenteric adenopathy. No enlarged pelvic or inguinal lymph nodes.  Reproductive: Previous hysterectomy.  No adnexal mass.  Other: No free fluid or fluid collections.  Musculoskeletal: There is a scoliosis deformity involving the lumbar spine which is convex towards the right. Multi level degenerative disc disease identified.  IMPRESSION: 1. Examination is positive for acute colitis involving the distal transverse colon through the rectum. This may be  inflammatory or infectious in etiology. Ischemic colitis not excluded. Negative for pneumatosis or perforation. 2. Aortic atherosclerosis. 3. Scoliosis and degenerative disc disease.   Electronically Signed   By: Kerby Moors M.D.   On: 02/22/2015 13:57   Dg Chest Port 1 View  03/04/2015   CLINICAL DATA:  82 year old female with chest pain. Clostridium difficile colitis. Initial encounter.  EXAM: PORTABLE CHEST 1 VIEW  COMPARISON:  Chest radiographs 02/22/2015 and earlier.  FINDINGS: Portable AP semi upright view at 0922 hours. Improved, chronically large lung volumes. Chronic hiatal hernia. Stable cardiac size and mediastinal contours. Stable apical pleural/parenchymal scarring. Otherwise, Allowing for portable technique, the lungs are clear. No pneumoperitoneum.  IMPRESSION: No acute cardiopulmonary abnormality.   Electronically Signed   By: Genevie Ann M.D.   On: 03/04/2015 09:57   Dg Chest Port 1 View  02/22/2015   CLINICAL DATA:  Sepsis. Dehydration and diarrhea. Shortness of breath. Nonsmoker.  EXAM: PORTABLE CHEST 1 VIEW  COMPARISON:  01/29/2015  FINDINGS: Numerous leads and wires project over the chest. Midline trachea. Normal heart size and mediastinal contours for age. No pleural effusion or pneumothorax. Biapical pleural thickening. Moderate hiatal hernia. Clear lungs.  IMPRESSION: No acute cardiopulmonary disease.  Hiatal hernia.   Electronically Signed   By: Abigail Miyamoto M.D.   On: 02/22/2015 18:22    Microbiology: No results found for this or any previous visit (from the past 240 hour(s)).   Labs: Basic Metabolic Panel:  Recent Labs Lab 03/01/15 0532 03/03/15 0549 03/04/15 0520 03/05/15 0515 03/06/15 0553  NA  --  134* 133* 131* 134*  K  --  3.0* 4.1 3.3* 3.3*  CL  --  102 105 104 105  CO2  --  25 21* 20* 21*  GLUCOSE  --  106* 94 93 88  BUN  --  11 13 10 13   CREATININE 0.69 0.82 0.69 0.72 0.70  CALCIUM  --  8.7* 8.8* 8.5* 8.4*   Liver Function Tests: No results for  input(s): AST, ALT, ALKPHOS, BILITOT, PROT, ALBUMIN in the last 168 hours. No results for input(s): LIPASE, AMYLASE in the last 168 hours. No results for input(s): AMMONIA in the last 168 hours. CBC:  Recent Labs Lab 03/03/15 0549 03/04/15 0520 03/05/15 0515 03/06/15 0553  WBC 11.5* 11.3* 8.3 7.1  HGB 10.8* 10.9* 10.2* 10.0*  HCT 32.0* 33.1* 30.6* 30.1*  MCV 81.4 82.8 82.7 82.9  PLT 402* 442* 420* 414*   Cardiac Enzymes:  Recent Labs Lab 03/04/15 1019 03/04/15 1540  TROPONINI <0.03 <0.03   BNP: BNP (last 3 results) No results for input(s): BNP in the last 8760 hours.  ProBNP (last 3 results) No  results for input(s): PROBNP in the last 8760 hours.  CBG: No results for input(s): GLUCAP in the last 168 hours.     SignedDomenic Polite  [facility] 03/06/2015, 10:10 AM

## 2015-03-07 ENCOUNTER — Non-Acute Institutional Stay (SKILLED_NURSING_FACILITY): Payer: Medicare PPO | Admitting: Internal Medicine

## 2015-03-07 DIAGNOSIS — E43 Unspecified severe protein-calorie malnutrition: Secondary | ICD-10-CM

## 2015-03-07 DIAGNOSIS — K5130 Ulcerative (chronic) rectosigmoiditis without complications: Secondary | ICD-10-CM

## 2015-03-07 DIAGNOSIS — Z87440 Personal history of urinary (tract) infections: Secondary | ICD-10-CM

## 2015-03-07 DIAGNOSIS — A0472 Enterocolitis due to Clostridium difficile, not specified as recurrent: Secondary | ICD-10-CM

## 2015-03-07 DIAGNOSIS — E876 Hypokalemia: Secondary | ICD-10-CM | POA: Diagnosis not present

## 2015-03-07 DIAGNOSIS — A047 Enterocolitis due to Clostridium difficile: Secondary | ICD-10-CM | POA: Diagnosis not present

## 2015-03-07 NOTE — Progress Notes (Signed)
Patient ID: Stacy Mcguire, female   DOB: 04-May-1928, 81 y.o.   MRN: [id number]    Facility; [facility] Chief complaint; admission to SNF post admit to [facility] from 9/26 to 03/06/2015  History;  this patient is a pleasant 81 year old woman who tells me that she was perfectly well up until June of this year. She had presented with generalized weakness abdominal pain and was ultimately diagnosed with UTI. She apparently had several courses of antibiotics perhaps 3. Sometime after this she started to have diarrhea off and on. She presented to hospital on this occasion with worsening watery diarrhea over 10 days prior to admission associated with abdominal cramps. She was started empirically on oral vancomycin with no improvement. Then switched IV Flagyl and vancomycin enemas again with little improvement. KUB showed no evidence of a toxic megacolon. At the suggestion of infectious disease she was started on Fidaxomicin 200 mg twice a day for 9 days after her arrival here. Currently she states she had 2 small formed bowel movements yesterday and has had 2 today. She feels a lot better.  She has an interesting history also of ulcerative colitis in the late 1990s 2000 range. She was followed by GI when she lived in [place]. Her last colonoscopy was in 2010 and she was told that her ulcerative colitis was "cured". I note that she is currently on mesalamine 4.8 g daily [4 tablets]. I note that she had a colonoscopy in 2010 that only showed diverticulosis in [place]. An endoscopy showed an hiatal hernia.  Other issues that we'll need watching a her hypokalemia with a low of 2.5 in the hospital. Her white count was in the high 30,000 range when she arrived in the hospital however this is come down nicely with successful treatment of the pseudomembranous colitis. Her albumen was 2.8 indicative of fairly severe protein malnutrition  BMP Latest Ref Rng 03/06/2015 03/05/2015 03/04/2015  Glucose 65 - 99 mg/dL  88 93 94  BUN 6 - 20 mg/dL '13 10 13  ' Creatinine 0.44 - 1.00 mg/dL 0.70 0.72 0.69  Sodium 135 - 145 mmol/L 134(L) 131(L) 133(L)  Potassium 3.5 - 5.1 mmol/L 3.3(L) 3.3(L) 4.1  Chloride 101 - 111 mmol/L 105 104 105  CO2 22 - 32 mmol/L 21(L) 20(L) 21(L)  Calcium 8.9 - 10.3 mg/dL 8.4(L) 8.5(L) 8.8(L)   CBC Latest Ref Rng 03/06/2015 03/05/2015 03/04/2015  WBC 4.0 - 10.5 K/uL 7.1 8.3 11.3(H)  Hemoglobin 12.0 - 15.0 g/dL 10.0(L) 10.2(L) 10.9(L)  Hematocrit 36.0 - 46.0 % 30.1(L) 30.6(L) 33.1(L)  Platelets 150 - 400 K/uL 414(H) 420(H) 442(H)   Past Medical History  Diagnosis Date  . Arthritis   . Anemia, unspecified   . Depression   . GERD (gastroesophageal reflux disease)   . Diverticulosis 2010    Colonoscopy  . Internal hemorrhoids 2010    Colonoscopy  . Hiatal hernia 2010    EGD   . Ulcerative (chronic) proctitis 2005    Colonoscopy   . Osteoarthritis   . Migraine   . Kidney disease, chronic, stage III (moderate, EGFR 30-59 ml/min)   . Shingles   . Hot flashes   . UTI (urinary tract infection)     Past Surgical History  Procedure Laterality Date  . Abdominal hysterectomy    . Knee surgery    . Mastoidectomy    . Tonsillectomy    . Nose surgery     Current Outpatient Prescriptions on File Prior to Visit  Medication Sig  Dispense Refill  . acetaminophen (TYLENOL) 325 MG tablet Take 2 tablets (650 mg total) by mouth every 6 (six) hours as needed for mild pain (or Fever >/= 101).    . diazepam (VALIUM) 2 MG tablet Take 1 tablet (2 mg total) by mouth at bedtime as needed for sedation (for sleep). 10 tablet 0  . fidaxomicin (DIFICID) 200 MG TABS tablet Take 1 tablet (200 mg total) by mouth 2 (two) times daily. For 9 days 18 tablet 0  . hydrocortisone cream 0.5 % Apply 1 application topically 2 (two) times daily as needed for itching.    . Loperamide HCl (ANTI-DIARRHEAL PO) Take 2 tablets by mouth daily as needed (diarrhea).    . mesalamine (LIALDA) 1.2 G EC tablet Take 4 tablets  (4.8 g total) by mouth daily with breakfast.    . ondansetron (ZOFRAN) 4 MG tablet Take 1 tablet (4 mg total) by mouth daily as needed for nausea. 30 tablet 1  . Probiotic Product (ALIGN PO) Take 1 capsule by mouth daily.    . sertraline (ZOLOFT) 50 MG tablet Take 50 mg by mouth daily.   1  . traMADol (ULTRAM) 50 MG tablet Take 1 tablet (50 mg total) by mouth every 4 (four) hours as needed. for pain 30 tablet 0  . Vitamin D, Ergocalciferol, (DRISDOL) 50000 UNITS CAPS capsule Take 50,000 Units by mouth daily.      Social history; patient lives in her own condominium in [place]. Independent with ADLs IADLs. As I understand things she had come to this facility in order to have the cost of that the Taxol Meissen covered presumably by the generosity of [place] since [place] would not cover it  reports that she has never smoked. She has never used smokeless tobacco. She reports that she does not drink alcohol or use illicit drugs.  indicated that her mother is deceased. She indicated that her father is deceased. She indicated that her sister is alive. She indicated that her daughter is alive. She indicated that both of her sons are alive.    Review of systems Gen.; states she has lost from 132 pounds down to 98 pounds since the beginning of this illness HEENT; no oral pain or swallowing difficulties. Respiratory no shortness of breath Cardiac no chest pain GI no current abdominal pain. No dysphagia. 2 formed bowel movements yesterday GU no current dysuria. She tells me she would have difficulty starting to void but never had urinary frequency dysuria hematuria even at the time she was having the UTIs in June Musculoskeletal; no significant joint problems. Neurologic; no focal weakness or numbness Gait no difficulties Mental status history of depression, does not appear to be any current issues  Physical examination Gen. thin woman but alert conversational does not appear to be acutely  ill HEENT oral exam is normal Lymph nonpalpable in the cervical clavicular or axillary areas Respiratory clear entry bilaterally no wheezing Cardiac heart sounds are normal no murmurs Abdomen no liver no spleen. She is diffusely tender but certainly no guarding or rebound GU bladder is not distended there is no CVA tenderness Extremities no edema Neurologic; mild generalized weakness. She is able to get herself up to a standing position independently. Somewhat hesitant gait but not particularly pathognomonic Mental status; upset about several things and in view of this I don't think she has significant depression  Impression/plan #1 severe pseudomembranous colitis/sepsis she is completing a course of fidaxomicin. She appears to be a lot better. #2 severe protein  calorie malnutrition I have talked to her about this. When of her major complaints was the bland diet with no salt she is on. I can see the point in putting a patient like this on dietary restrictions I will make her DAT #3 hypokalemia it does not appear that this was adequately replaced likely severe total body potassium depletion I will start her back on potassium today check it tomorrow along with a magnesium level. #4 question recurrent UTIs in June; it is difficult to second-guess this. She states she has difficulty voiding or at least initiating a void but never had dysuria. I'll check a post void residual urine on her. #5 history of ulcerative colitis on mesalamine. Last colonoscopy was in 2010 that only showed diverticulosis. The patient states she was told by GI in [place] that she was "cured". #6 acute renal failure due to volume depletion now resolved #7 extreme leukocytosis which has normalized compatible with the severity of her pseudomembranous colitis.  Think this patient is going to do well. She'll need some physical and occupational therapy. I changed her to a regular diet. I suspect she will have hypokalemia therefore  I'm going to start her back on potassium supplements.

## 2015-03-08 ENCOUNTER — Non-Acute Institutional Stay (SKILLED_NURSING_FACILITY): Payer: Medicare PPO | Admitting: Internal Medicine

## 2015-03-08 ENCOUNTER — Other Ambulatory Visit: Payer: Self-pay | Admitting: *Deleted

## 2015-03-08 ENCOUNTER — Encounter (HOSPITAL_COMMUNITY)
Admission: RE | Admit: 2015-03-08 | Discharge: 2015-03-08 | Disposition: A | Discharge status: Home / Self Care | Payer: Medicare PPO | Source: Skilled Nursing Facility | Attending: Internal Medicine | Admitting: Internal Medicine

## 2015-03-08 DIAGNOSIS — A047 Enterocolitis due to Clostridium difficile: Secondary | ICD-10-CM

## 2015-03-08 DIAGNOSIS — K5130 Ulcerative (chronic) rectosigmoiditis without complications: Secondary | ICD-10-CM | POA: Diagnosis not present

## 2015-03-08 DIAGNOSIS — E876 Hypokalemia: Secondary | ICD-10-CM

## 2015-03-08 DIAGNOSIS — A0472 Enterocolitis due to Clostridium difficile, not specified as recurrent: Secondary | ICD-10-CM

## 2015-03-08 DIAGNOSIS — E43 Unspecified severe protein-calorie malnutrition: Secondary | ICD-10-CM | POA: Diagnosis not present

## 2015-03-08 LAB — BASIC METABOLIC PANEL
Anion gap: 5 (ref 5–15)
BUN: 15 mg/dL (ref 6–20)
CO2: 25 mmol/L (ref 22–32)
Calcium: 8.1 mg/dL — ABNORMAL LOW (ref 8.9–10.3)
Chloride: 103 mmol/L (ref 101–111)
Creatinine, Ser: 0.77 mg/dL (ref 0.44–1.00)
GFR calc Af Amer: >60 mL/min (ref >60)
Glucose, Bld: 113 mg/dL — ABNORMAL HIGH (ref 65–99)
POTASSIUM: 3.6 mmol/L (ref 3.5–5.1)
Sodium: 133 mmol/L — ABNORMAL LOW (ref 135–145)

## 2015-03-08 LAB — MAGNESIUM: Magnesium: 1.6 mg/dL — ABNORMAL LOW (ref 1.7–2.4)

## 2015-03-08 MED ORDER — DIAZEPAM 2 MG PO TABS: ORAL_TABLET | ORAL | Status: DC

## 2015-03-08 MED ORDER — TRAMADOL HCL 50 MG PO TABS: ORAL_TABLET | ORAL | Status: DC

## 2015-03-08 NOTE — Telephone Encounter (Signed)
[facility] 

## 2015-03-08 NOTE — Progress Notes (Signed)
Patient ID: Stacy Mcguire, female   DOB: 02-04-1928, 81 y.o.   MRN: [id number] Facility; [facility] Chief complaint; follow-up pseudomembranous colitis/electrolyte problems History; this is a patient I admitted to the facility yesterday. She has been hospitalized from 9/26 through 03/06/2015 with severe pseudomembranous colitis, generalized weakness severe protein calorie malnutrition, hypokalemia. She left the hospital with a potassium of 3.3 not on any potassium supplements. I started potassium yesterday. Surprisingly her potassium is normal at 3.6 this morning however her magnesium was low at 1.6 which I will replace. Her white blood count normalized which had been in the 30,000 range. She is on Fidaxomicin for 9 days after her arrival here. I think she should be done 1 week from today. She is also on probiotics and mesalamine. The mesalamine dates back to a history of ulcerative colitis. I am not completely sure if she was on this prior to admission. I note her colonoscopy in 2010 showed diverticulosis not findings consistent with ulcerative colitis and she tells me she really didn't have any features of ulcerative colitis until the recent illness with diarrhea that turned out to be pseudomembranous colitis. C. difficile toxin and antigen were positive. Stool cultures negative. She apparently had 4 loose but otherwise formed stools and one large diarrheal stool yesterday. The patient also complains of dyspepsia last night I think received an acids. I note that she was on Prilosec this was stopped I'm assuming related to the mesalamine.  BMP Latest Ref Rng 03/08/2015 03/06/2015 03/05/2015  Glucose 65 - 99 mg/dL 113(H) 88 93  BUN 6 - 20 mg/dL 15 13 10   Creatinine 0.44 - 1.00 mg/dL 0.77 0.70 0.72  Sodium 135 - 145 mmol/L 133(L) 134(L) 131(L)  Potassium 3.5 - 5.1 mmol/L 3.6 3.3(L) 3.3(L)  Chloride 101 - 111 mmol/L 103 105 104  CO2 22 - 32 mmol/L 25 21(L) 20(L)  Calcium 8.9 - 10.3 mg/dL 8.1(L) 8.4(L) 8.5(L)     Current Outpatient Prescriptions on File Prior to Visit  Medication Sig Dispense Refill  . acetaminophen (TYLENOL) 325 MG tablet Take 2 tablets (650 mg total) by mouth every 6 (six) hours as needed for mild pain (or Fever >/= 101).    . diazepam (VALIUM) 2 MG tablet Take 1 tablet (2 mg total) by mouth at bedtime as needed for sedation (for sleep). 10 tablet 0  . fidaxomicin (DIFICID) 200 MG TABS tablet Take 1 tablet (200 mg total) by mouth 2 (two) times daily. For 9 days 18 tablet 0  . hydrocortisone cream 0.5 % Apply 1 application topically 2 (two) times daily as needed for itching.    . Loperamide HCl (ANTI-DIARRHEAL PO) Take 2 tablets by mouth daily as needed (diarrhea).    . mesalamine (LIALDA) 1.2 G EC tablet Take 4 tablets (4.8 g total) by mouth daily with breakfast.    . ondansetron (ZOFRAN) 4 MG tablet Take 1 tablet (4 mg total) by mouth daily as needed for nausea. 30 tablet 1  . Probiotic Product (ALIGN PO) Take 1 capsule by mouth daily.    . sertraline (ZOLOFT) 50 MG tablet Take 50 mg by mouth daily.   1  . traMADol (ULTRAM) 50 MG tablet Take 1 tablet (50 mg total) by mouth every 4 (four) hours as needed. for pain 30 tablet 0  . Vitamin D, Ergocalciferol, (DRISDOL) 50000 UNITS CAPS capsule Take 50,000 Units by mouth daily.     Review of systems Gen "I don't feel well" Respiratory no shortness of breath Cardiac  no chest pain GI complained of heartburn/dyspepsia symptoms last night did not this morning she did not vomit there is no dysphagia bowel movements as noted above GU no dysuria although she never had dysuria even during her suppose it UTIs in June Musculoskeletal no acute joints  Physical examination Gen. thin elderly female in no distress alert and orientated. Respiratory clear entry bilaterally Cardiac heart sounds are normal she is euvolemic. GI; no tenderness no distention no liver no spleen bowel sounds are positive GU no suprapubic or costovertebral angle  tenderness.  Impression/plan #1 pseudomembranous colitis. Her bowel movements are mostly formed. I think this is indicative of improvement. She will be on for Doxil Meissen I believe through next week and #2 slight gait ataxia secondary to overall protein calorie malnutrition muscular weakness I'll ask for physical and occupational therapy reviews #3 hypokalemia; surprisingly not hypokalemic today although this may reflect intracellular and extracellular shifts. I'm going to keep her on potassium 20 twice a day for another 3 days #4 hypomagnesemia start her on magnesium oxide and repeat a level next week. #5 protein calorie malnutrition; once again I get the impression that the patient is a fastidious eater, I have urged again status. I've liberalized her diet #6 history of ulcerative colitis on mesalamine. I'm not particularly sure of the history here. She is telling me she can't have milk products I wonder if that has to do with acidity issues in the stomach related to mesalamine metabolism

## 2015-03-15 ENCOUNTER — Non-Acute Institutional Stay (SKILLED_NURSING_FACILITY): Payer: Medicare PPO | Admitting: Internal Medicine

## 2015-03-15 ENCOUNTER — Encounter (HOSPITAL_COMMUNITY)
Admission: RE | Admit: 2015-03-15 | Discharge: 2015-03-15 | Disposition: A | Discharge status: Home / Self Care | Payer: Medicare PPO | Source: Skilled Nursing Facility | Attending: Internal Medicine | Admitting: Internal Medicine

## 2015-03-15 DIAGNOSIS — E876 Hypokalemia: Secondary | ICD-10-CM

## 2015-03-15 DIAGNOSIS — A0472 Enterocolitis due to Clostridium difficile, not specified as recurrent: Secondary | ICD-10-CM

## 2015-03-15 DIAGNOSIS — A047 Enterocolitis due to Clostridium difficile: Secondary | ICD-10-CM | POA: Diagnosis not present

## 2015-03-15 LAB — BASIC METABOLIC PANEL
Anion gap: 6 (ref 5–15)
BUN: 20 mg/dL (ref 6–20)
CHLORIDE: 103 mmol/L (ref 101–111)
CO2: 28 mmol/L (ref 22–32)
Calcium: 9.3 mg/dL (ref 8.9–10.3)
Creatinine, Ser: 0.93 mg/dL (ref 0.44–1.00)
GFR calc non Af Amer: 55 mL/min — ABNORMAL LOW (ref >60)
Glucose, Bld: 93 mg/dL (ref 65–99)
Potassium: 3.9 mmol/L (ref 3.5–5.1)
SODIUM: 137 mmol/L (ref 135–145)

## 2015-03-15 LAB — MAGNESIUM: MAGNESIUM: 1.9 mg/dL (ref 1.7–2.4)

## 2015-03-15 NOTE — Progress Notes (Signed)
Patient ID: Stacy Mcguire, female   DOB: 1928/04/20, 81 y.o.   MRN: [id number] Facility; [facility] Chief complaint; follow-up severe pseudomembranous colitis History; this is a patient who came to [place] a little over a week ago. The she was admitted with a progressive history of water.watery diarrhea. This did not respond to standard treatment including IV Flagyl and vancomycin enemas, oral vancomycin. Ultimately she was suggested for a course of Fidaxomicin. She completes her 10 day course tomorrow. She is still having frequent bowel movements to 4 so far this morning 3 yesterday and 4 the day before although she states these are formed. She is complaining of heartburn but no abdominal pain.  Past Medical History  Diagnosis Date  . Arthritis   . Anemia, unspecified   . Depression   . GERD (gastroesophageal reflux disease)   . Diverticulosis 2010    Colonoscopy  . Internal hemorrhoids 2010    Colonoscopy  . Hiatal hernia 2010    EGD   . Ulcerative (chronic) proctitis 2005    Colonoscopy   . Osteoarthritis   . Migraine   . Kidney disease, chronic, stage III (moderate, EGFR 30-59 ml/min)   . Shingles   . Hot flashes   . UTI (urinary tract infection)    BMP Latest Ref Rng 03/15/2015 03/08/2015 03/06/2015  Glucose 65 - 99 mg/dL 93 113(H) 88  BUN 6 - 20 mg/dL '20 15 13  ' Creatinine 0.44 - 1.00 mg/dL 0.93 0.77 0.70  Sodium 135 - 145 mmol/L 137 133(L) 134(L)  Potassium 3.5 - 5.1 mmol/L 3.9 3.6 3.3(L)  Chloride 101 - 111 mmol/L 103 103 105  CO2 22 - 32 mmol/L 28 25 21(L)  Calcium 8.9 - 10.3 mg/dL 9.3 8.1(L) 8.4(L)   Review of systems Gen. patient feels generally weak Respiratory; no shortness of breath Cardiac no chest pain GI talks about a epigastric burning discomfort once and acids GU; no dysuria  Physical examination Gen. the patient still looks very weak Respiratory clear entry bilaterally Cardiac extension weighted S1 no murmurs no gallops she appears to be euvolemic Abdomen;  bowel sounds are positive no liver no spleen no tenderness GU no suprapubic or costovertebral angle tenderness Extremities: No edema CNS She is up walking with her walker.   1) Pseudomembranous colitis: although the hisitory of formed stool  Would otherwise be re-assuring, I wonder about this and I'm going to have the nurse be more vigilant. ?hx of UC.  2) electrolytes have been corrected. BMP today is fine.

## 2015-03-16 ENCOUNTER — Emergency Department (HOSPITAL_COMMUNITY)
Admission: EM | Admit: 2015-03-16 | Discharge: 2015-03-17 | Payer: Medicare PPO | Attending: Emergency Medicine | Admitting: Emergency Medicine

## 2015-03-16 ENCOUNTER — Encounter (HOSPITAL_COMMUNITY): Payer: Self-pay | Admitting: Emergency Medicine

## 2015-03-16 DIAGNOSIS — F332 Major depressive disorder, recurrent severe without psychotic features: Secondary | ICD-10-CM | POA: Diagnosis present

## 2015-03-16 DIAGNOSIS — K5120 Ulcerative (chronic) proctitis without complications: Secondary | ICD-10-CM | POA: Insufficient documentation

## 2015-03-16 DIAGNOSIS — Y9389 Activity, other specified: Secondary | ICD-10-CM | POA: Diagnosis not present

## 2015-03-16 DIAGNOSIS — Y288XXA Contact with other sharp object, undetermined intent, initial encounter: Secondary | ICD-10-CM | POA: Insufficient documentation

## 2015-03-16 DIAGNOSIS — Z8619 Personal history of other infectious and parasitic diseases: Secondary | ICD-10-CM | POA: Diagnosis not present

## 2015-03-16 DIAGNOSIS — S50812A Abrasion of left forearm, initial encounter: Secondary | ICD-10-CM | POA: Insufficient documentation

## 2015-03-16 DIAGNOSIS — F1310 Sedative, hypnotic or anxiolytic abuse, uncomplicated: Secondary | ICD-10-CM | POA: Diagnosis not present

## 2015-03-16 DIAGNOSIS — Z87440 Personal history of urinary (tract) infections: Secondary | ICD-10-CM | POA: Insufficient documentation

## 2015-03-16 DIAGNOSIS — N183 Chronic kidney disease, stage 3 (moderate): Secondary | ICD-10-CM | POA: Insufficient documentation

## 2015-03-16 DIAGNOSIS — F329 Major depressive disorder, single episode, unspecified: Secondary | ICD-10-CM | POA: Insufficient documentation

## 2015-03-16 DIAGNOSIS — Z862 Personal history of diseases of the blood and blood-forming organs and certain disorders involving the immune mechanism: Secondary | ICD-10-CM | POA: Insufficient documentation

## 2015-03-16 DIAGNOSIS — Z751 Person awaiting admission to adequate facility elsewhere: Secondary | ICD-10-CM

## 2015-03-16 DIAGNOSIS — Z79899 Other long term (current) drug therapy: Secondary | ICD-10-CM | POA: Diagnosis not present

## 2015-03-16 DIAGNOSIS — T1491 Suicide attempt: Secondary | ICD-10-CM | POA: Diagnosis not present

## 2015-03-16 DIAGNOSIS — Y998 Other external cause status: Secondary | ICD-10-CM | POA: Diagnosis not present

## 2015-03-16 DIAGNOSIS — M1990 Unspecified osteoarthritis, unspecified site: Secondary | ICD-10-CM | POA: Diagnosis not present

## 2015-03-16 DIAGNOSIS — R45851 Suicidal ideations: Secondary | ICD-10-CM | POA: Diagnosis present

## 2015-03-16 DIAGNOSIS — Y9289 Other specified places as the place of occurrence of the external cause: Secondary | ICD-10-CM | POA: Diagnosis not present

## 2015-03-16 DIAGNOSIS — T1491XA Suicide attempt, initial encounter: Secondary | ICD-10-CM | POA: Diagnosis present

## 2015-03-16 DIAGNOSIS — Z8679 Personal history of other diseases of the circulatory system: Secondary | ICD-10-CM | POA: Diagnosis not present

## 2015-03-16 LAB — RAPID URINE DRUG SCREEN, HOSP PERFORMED
Amphetamines: NOT DETECTED
Barbiturates: NOT DETECTED
Benzodiazepines: POSITIVE — AB
COCAINE: NOT DETECTED
Opiates: NOT DETECTED
Tetrahydrocannabinol: NOT DETECTED

## 2015-03-16 LAB — COMPREHENSIVE METABOLIC PANEL
ALBUMIN: 3.1 g/dL — AB (ref 3.5–5.0)
ALT: 27 U/L (ref 14–54)
AST: 21 U/L (ref 15–41)
Alkaline Phosphatase: 70 U/L (ref 38–126)
Anion gap: 8 (ref 5–15)
BILIRUBIN TOTAL: 0.8 mg/dL (ref 0.3–1.2)
BUN: 22 mg/dL — AB (ref 6–20)
CALCIUM: 9.8 mg/dL (ref 8.9–10.3)
CO2: 25 mmol/L (ref 22–32)
Chloride: 99 mmol/L — ABNORMAL LOW (ref 101–111)
Creatinine, Ser: 1 mg/dL (ref 0.44–1.00)
GFR calc Af Amer: 58 mL/min — ABNORMAL LOW (ref >60)
GFR calc non Af Amer: 50 mL/min — ABNORMAL LOW (ref >60)
GLUCOSE: 117 mg/dL — AB (ref 65–99)
Potassium: 3.4 mmol/L — ABNORMAL LOW (ref 3.5–5.1)
Sodium: 132 mmol/L — ABNORMAL LOW (ref 135–145)
Total Protein: 6.2 g/dL — ABNORMAL LOW (ref 6.5–8.1)

## 2015-03-16 LAB — CBC
HCT: 33.2 % — ABNORMAL LOW (ref 36.0–46.0)
Hemoglobin: 10.8 g/dL — ABNORMAL LOW (ref 12.0–15.0)
MCH: 27.6 pg (ref 26.0–34.0)
MCHC: 32.5 g/dL (ref 30.0–36.0)
MCV: 84.7 fL (ref 78.0–100.0)
PLATELETS: 341 10*3/uL (ref 150–400)
RBC: 3.92 MIL/uL (ref 3.87–5.11)
RDW: 17.4 % — AB (ref 11.5–15.5)
WBC: 8.8 10*3/uL (ref 4.0–10.5)

## 2015-03-16 LAB — SALICYLATE LEVEL

## 2015-03-16 LAB — ETHANOL: Alcohol, Ethyl (B): <5 mg/dL (ref <5)

## 2015-03-16 LAB — ACETAMINOPHEN LEVEL: Acetaminophen (Tylenol), Serum: <10 ug/mL — ABNORMAL LOW (ref 10–30)

## 2015-03-16 MED ORDER — BACITRACIN ZINC 500 UNIT/GM EX OINT
TOPICAL_OINTMENT | CUTANEOUS | Status: AC
Start: 2015-03-16 — End: 2015-03-16
  Administered 2015-03-16: 20:00:00
  Filled 2015-03-16: qty 1.8

## 2015-03-16 MED ORDER — ALUM & MAG HYDROXIDE-SIMETH 200-200-20 MG/5ML PO SUSP
30.0000 mL | Freq: Once | ORAL | Status: AC
  Administered 2015-03-16: 30 mL via ORAL
  Filled 2015-03-16: qty 30

## 2015-03-16 NOTE — BH Assessment (Addendum)
Tele Assessment Note   Stacy Mcguire is an 81 y.o. female  presenting to [place] accompanied by her son and daughter in law after a suicide attempt. Pt stated "I have been sick since June 1st". "Ever since then my brain doesn't work good anymore". "I tried to slit my wrist and I couldn't even do that". Pt's son reported that she attempted to shoot herself with the gun but she could not fire the gun. Pt did not report any previous suicide attempts or a psychiatric history. Pt reported that she was prescribed Prozac in the past due to feeling weak; however she did not notice any changes. Pt reported that her current medical issues are a stressor. Pt is reporting the following depressive symptoms: despondent, fatigue, feelings of guilt and feeling worthless. Pt reported that she sleeps all the time and shared that she has lost approximately 30lbs in the past 5 months. Pt denies HI and AVH at this time. Pt did not report any alcohol or illicit substance abuse in the past year. Pt did not report any physical, sexual or emotional abuse. Inpatient treatment is recommended for psychiatric stabilization.   Diagnosis: Major Depressive Disorder, Single episode, Severe.   Past Medical History:  Past Medical History  Diagnosis Date  . Arthritis   . Anemia, unspecified   . Depression   . GERD (gastroesophageal reflux disease)   . Diverticulosis 2010    Colonoscopy  . Internal hemorrhoids 2010    Colonoscopy  . Hiatal hernia 2010    EGD   . Ulcerative (chronic) proctitis ([place]) 2005    Colonoscopy   . Osteoarthritis   . Migraine   . Kidney disease, chronic, stage III (moderate, EGFR 30-59 ml/min)   . Shingles   . Hot flashes   . UTI (urinary tract infection)     Past Surgical History  Procedure Laterality Date  . Abdominal hysterectomy    . Knee surgery    . Mastoidectomy    . Tonsillectomy    . Nose surgery      Family History:  Family History  Problem Relation Age of Onset  . Breast cancer  Mother   . Colon cancer Neg Hx   . Breast cancer Sister 50  . Stroke Mother 38  . Diabetes Son 59  . Prostate cancer Son     Social History:  reports that she has never smoked. She has never used smokeless tobacco. She reports that she does not drink alcohol or use illicit drugs.  Additional Social History:  Alcohol / Drug Use History of alcohol / drug use?: No history of alcohol / drug abuse  CIWA: CIWA-Ar BP: 135/80 mmHg Pulse Rate: 109 COWS:    PATIENT STRENGTHS: (choose at least two) Average or above average intelligence Supportive family/friends  Allergies:  Allergies  Allergen Reactions  . Neosporin Af [Miconazole] Itching    Home Medications:  (Not in a hospital admission)  OB/GYN Status:  No LMP recorded. Patient has had a hysterectomy.  General Assessment Data [facility] of [facility]: [facility] TTS Assessment: In system Is this a Tele or Face-to-Face Assessment?: Face-to-Face Is this an Initial Assessment or a Re-assessment for this encounter?: Initial Assessment Living Arrangements: Alone Can pt return to current living arrangement?: Yes Admission Status: Voluntary Is patient capable of signing voluntary admission?: Yes Referral Source: Self/Family/Friend Insurance type: [facility] Medicare     Crisis Care Plan Living Arrangements: Alone Name of Psychiatrist: No provider reported. Name of Therapist: No provider reported.  Education Status Is patient currently in school?: No Current Grade: N/A Highest grade of school patient has completed: N/A Name of school: N/A Contact person: N/A  Risk to self with the past 6 months Suicidal Ideation: Yes-Currently Present Has patient been a risk to self within the past 6 months prior to admission? : No Suicidal Intent: Yes-Currently Present Has patient had any suicidal intent within the past 6 months prior to admission? : No Is patient at risk for suicide?: Yes Suicidal Plan?: Yes-Currently Present Has patient  had any suicidal plan within the past 6 months prior to admission? : No Specify Current Suicidal Plan: Pt reported that she tried to slit her wrist and attempted to shoot herself with a gun.  Access to Means: Yes Specify Access to Suicidal Means: Pt had access to a gun.  What has been your use of drugs/alcohol within the last 12 months?: No alcohol or drug use reported.  Previous Attempts/Gestures: No How many times?: 0 Other Self Harm Risks: No other self harm risk identified.  Triggers for Past Attempts: None known Intentional Self Injurious Behavior: None Family Suicide History: No Recent stressful life event(s): Other (Comment) (Medical issues ) Persecutory voices/beliefs?: No Depression: Yes Depression Symptoms: Fatigue, Guilt, Feeling worthless/self pity, Despondent (Increased sleep) Substance abuse history and/or treatment for substance abuse?: No Suicide prevention information given to non-admitted patients: Not applicable  Risk to Others within the past 6 months Homicidal Ideation: No Does patient have any lifetime risk of violence toward others beyond the six months prior to admission? : No Thoughts of Harm to Others: No Current Homicidal Intent: No Current Homicidal Plan: No Access to Homicidal Means: No Identified Victim: N/A History of harm to others?: No Assessment of Violence: On admission Violent Behavior Description: No violent behaviors observed. PT is calm and cooperative.  Does patient have access to weapons?: Yes (Comment) (Gun ) Criminal Charges Pending?: No Does patient have a court date: No Is patient on probation?: No  Psychosis Hallucinations: None noted Delusions: None noted  Mental Status Report Appearance/Hygiene: Unable to Assess (Pt has blanket pulled up to neck. ) Eye Contact: Fair Motor Activity: Unable to assess Speech: Logical/coherent, Soft Level of Consciousness: Quiet/awake Mood: Depressed, Sad Affect: Sad Anxiety Level:  Minimal Thought Processes: Coherent, Relevant Judgement: Unimpaired Orientation: Appropriate for developmental age Obsessive Compulsive Thoughts/Behaviors: None  Cognitive Functioning Concentration: Normal Memory: Remote Intact, Recent Intact IQ: Average Insight: Poor Impulse Control: Poor Appetite: Fair Weight Loss: 30 (In 5 months ) Weight Gain: 0 Sleep: Increased Vegetative Symptoms: Staying in bed (Laying on couch or bed)  ADLScreening [facility] Assessment Services) Patient's cognitive ability adequate to safely complete daily activities?: Yes Patient able to express need for assistance with ADLs?: Yes Independently performs ADLs?: No  Prior Inpatient Therapy Prior Inpatient Therapy: No Prior Therapy Dates: N/A Prior Therapy Facilty/Provider(s): N/A Reason for Treatment: N/A  Prior Outpatient Therapy Prior Outpatient Therapy: No Prior Therapy Dates: N/A Prior Therapy Facilty/Provider(s): N/A Reason for Treatment: N/A Does patient have an ACCT team?: No Does patient have Intensive In-House Services?  : No Does patient have [facility] services? : No Does patient have P4CC services?: No  ADL Screening (condition at time of admission) Patient's cognitive ability adequate to safely complete daily activities?: Yes Is the patient deaf or have difficulty hearing?: No Does the patient have difficulty seeing, even when wearing glasses/contacts?: No Patient able to express need for assistance with ADLs?: Yes Does the patient have difficulty dressing or bathing?: No Independently performs  ADLs?: No       Abuse/Neglect Assessment (Assessment to be complete while patient is alone) Physical Abuse: Denies Verbal Abuse: Denies Sexual Abuse: Denies Exploitation of patient/patient's resources: Denies Self-Neglect: Denies     Regulatory affairs officer (For Healthcare) Does patient have an advance directive?: No Would patient like information on creating an advanced directive?: No -  patient declined information    Additional Information 1:1 In Past 12 Months?: No CIRT Risk: No Elopement Risk: No Does patient have medical clearance?: Yes     Disposition:  Disposition Initial Assessment Completed for this Encounter: Yes Disposition of Patient: Inpatient treatment program Type of inpatient treatment program: Adult  Berthel Bagnall S 03/16/2015 9:08 PM

## 2015-03-16 NOTE — ED Notes (Signed)
AC and Staffing office made aware of need for sitters

## 2015-03-16 NOTE — ED Notes (Signed)
Per family/patient-tried to kill herself today-pulled trigger on gun twice and superficial laceration to left wrist

## 2015-03-16 NOTE — BH Assessment (Signed)
Assessment completed. Consulted [employer] who agrees that pt meets inpatient criteria. Informed Bonna Gains, PA-C of the recommendation.

## 2015-03-16 NOTE — Progress Notes (Signed)
EDCM spoke to patient and family at bedside.  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  Patient's pcp is Dr. Leighton Ruff.  Patient's daughter in law reports patient has an appointment with her GI specialist Dr. Amedeo Plenty at [facility] on Nov 4th.  EDCM asked patient is she is able to perform her own ADL's at home?  Patient responded, "I used too, until I got sick."  Patient reports her son and daughter in law have been coming to see her every day.  Patient's daughter in law reports patient was recently admitted to the hospital for [place] and then went to [facility] in [place] for short term rehab.  Patient has just returned home yesterday from [facility].  Patient's daughter in law reports home health services with [facility] for RN and PT were arranged but were cancelled today due to patient coming to the ED.  Patient's daughter in law reports they would like the patient to go back to rehab so that patient can "get her strength up."  Patient does not want to go back to [facility].  EDCM placed consult for EDSW.  Patient has a walker, cane and shower chair at home.  Patient recently admitted from 09/26 to 10/08.  No further EDCM needs at this time.

## 2015-03-16 NOTE — ED Provider Notes (Signed)
CSN: [id number]     Arrival date & time 03/16/15  1817 History   None    Chief Complaint  Patient presents with  . Suicidal     (Consider location/radiation/quality/duration/timing/severity/associated sxs/prior Treatment) The history is provided by the patient and a caregiver. No language interpreter was used.   Ms. Roller is an 81 y.o female with a history of c.diff., colitis, stage III kidney disease, and depression who presents for suicidal ideation and attempt with a gun. Her son states that she tried to shoot herself twice but could not fire the gun. He then came over to see her and she had a razor blade in her hand and tried to cut her left forearm. Son stated that he cleaned her wound. Daughter-in-law and son state that she has been in the hospital recently for ulcerative colitis and diarrhea. She was at an [facility] [place] for physical therapy and to receive a special medication for ulcerative colitis. The daughter-in-law tried to get her into another facility since she felt that the care was not that great but most of the facilities would not accept her since she had a history of C. difficile. She states that she needs physical therapy. She also says that she has been doing better and has not had diarrhea since her discharge from [facility] 2 days ago. She lives alone at home.  The patient states that she just is tired of everything and if she could get better she would not feel this way. She also states that [facility] not [facility] and treated her badly. The patient and her son both agree to voluntary admission to behavioral health and do not want her to be IVC'd. She denies any pain now including chest pain, shortness of breath, abdominal pain, nausea, vomiting, or diarrhea. She lives on her own. Her tetanus is up-to-date.  Past Medical History  Diagnosis Date  . Arthritis   . Anemia, unspecified   . Depression   . GERD (gastroesophageal reflux disease)    . Diverticulosis 2010    Colonoscopy  . Internal hemorrhoids 2010    Colonoscopy  . Hiatal hernia 2010    EGD   . Ulcerative (chronic) proctitis ([place]) 2005    Colonoscopy   . Osteoarthritis   . Migraine   . Kidney disease, chronic, stage III (moderate, EGFR 30-59 ml/min)   . Shingles   . Hot flashes   . UTI (urinary tract infection)    Past Surgical History  Procedure Laterality Date  . Abdominal hysterectomy    . Knee surgery    . Mastoidectomy    . Tonsillectomy    . Nose surgery     Family History  Problem Relation Age of Onset  . Breast cancer Mother   . Colon cancer Neg Hx   . Breast cancer Sister 35  . Stroke Mother 90  . Diabetes Son 56  . Prostate cancer Son    Social History  Substance Use Topics  . Smoking status: Never Smoker   . Smokeless tobacco: Never Used  . Alcohol Use: No   OB History    No data available     Review of Systems  Constitutional: Negative for fever.  Respiratory: Negative for shortness of breath.   Cardiovascular: Negative for chest pain.  Gastrointestinal: Negative for abdominal pain.  Psychiatric/Behavioral: Positive for suicidal ideas.  All other systems reviewed and are negative.     Allergies  Neosporin af  Home  Medications   Prior to Admission medications   Medication Sig Start Date End Date Taking? Authorizing Provider  acetaminophen (TYLENOL) 325 MG tablet Take 2 tablets (650 mg total) by mouth every 6 (six) hours as needed for mild pain (or Fever >/= 101). 01/31/15  Yes Kinnie Feil, MD  alum & mag hydroxide-simeth (MAALOX/MYLANTA) 200-200-20 MG/5ML suspension Take 30 mLs by mouth every 6 (six) hours as needed for indigestion or heartburn.   Yes Historical Provider, MD  diazepam (VALIUM) 2 MG tablet Take one tablet by mouth at bedtime for sedation Patient taking differently: Take 2 mg by mouth at bedtime as needed for sedation.  03/08/15  Yes Tiffany L Reed, DO  hydrocortisone cream 0.5 % Apply 1 application  topically 2 (two) times daily as needed for itching.   Yes Historical Provider, MD  mesalamine (LIALDA) 1.2 G EC tablet Take 4 tablets (4.8 g total) by mouth daily with breakfast. Patient taking differently: Take 2.4 g by mouth 2 (two) times daily.  03/06/15  Yes Domenic Polite, MD  ondansetron (ZOFRAN) 4 MG tablet Take 1 tablet (4 mg total) by mouth daily as needed for nausea. 01/31/15  Yes Kinnie Feil, MD  Probiotic Product (ALIGN PO) Take 1 capsule by mouth daily.   Yes Historical Provider, MD  sertraline (ZOLOFT) 50 MG tablet Take 50 mg by mouth at bedtime.  01/14/15  Yes Historical Provider, MD  traMADol (ULTRAM) 50 MG tablet Take one tablet by mouth every 4 hours as needed for pain 03/08/15  Yes Tiffany L Reed, DO  Vitamin D, Ergocalciferol, (DRISDOL) 50000 UNITS CAPS capsule Take 50,000 Units by mouth daily.   Yes Historical Provider, MD  fidaxomicin (DIFICID) 200 MG TABS tablet Take 1 tablet (200 mg total) by mouth 2 (two) times daily. For 9 days Patient not taking: Reported on 03/16/2015 03/06/15   Domenic Polite, MD   BP 135/80 mmHg  Pulse 109  Temp(Src) 97.8 F (36.6 C)  Resp 18  SpO2 100% Physical Exam  Constitutional: She is oriented to person, place, and time. She appears well-developed and well-nourished.  She is sitting comfortably in her nightgown. No acute distress.  HENT:  Head: Normocephalic and atraumatic.  Eyes: Conjunctivae are normal.  Neck: Normal range of motion. Neck supple.  Cardiovascular: Normal rate, regular rhythm and normal heart sounds.   Pulmonary/Chest: Effort normal and breath sounds normal. No respiratory distress.  Abdominal: Soft. There is no tenderness.  Musculoskeletal: Normal range of motion.  Neurological: She is alert and oriented to person, place, and time.  Skin: Skin is warm and dry.  Superficial abrasions to the left forearm measuring approximately 6 cm in length. No active bleeding or signs of cellulitis.  Psychiatric: She has a normal  mood and affect. Her behavior is normal.  Nursing note and vitals reviewed.   ED Course  Procedures (including critical care time) Labs Review Labs Reviewed  COMPREHENSIVE METABOLIC PANEL - Abnormal; Notable for the following:    Sodium 132 (*)    Potassium 3.4 (*)    Chloride 99 (*)    Glucose, Bld 117 (*)    BUN 22 (*)    Total Protein 6.2 (*)    Albumin 3.1 (*)    GFR calc non Af Amer 50 (*)    GFR calc Af Amer 58 (*)    All other components within normal limits  CBC - Abnormal; Notable for the following:    Hemoglobin 10.8 (*)    HCT 33.2 (*)  RDW 17.4 (*)    All other components within normal limits  ACETAMINOPHEN LEVEL - Abnormal; Notable for the following:    Acetaminophen (Tylenol), Serum <10 (*)    All other components within normal limits  ETHANOL  SALICYLATE LEVEL  URINE RAPID DRUG SCREEN, HOSP PERFORMED    Imaging Review No results found. I have personally reviewed and evaluated these lab results as part of my medical decision-making.   EKG Interpretation None      MDM   Final diagnoses:  Suicide attempt [facility])  Patient presents for SI with attempt to shoot herself with a gun and cut her wrist with a razor blade. She is stable and psych hold orders have been placed.  She has no complaints of pain at this time.  Patient was seen and evaluated by Dr. Canary Brim. Her vitals are stable and labs are not concerning. She meets inpatient criteria will be admitted to behavioral health.    Ottie Glazier, PA-C 03/16/15 2157  Alfonzo Beers, MD 03/16/15 2209

## 2015-03-17 ENCOUNTER — Emergency Department (HOSPITAL_COMMUNITY): Payer: Medicare PPO

## 2015-03-17 DIAGNOSIS — T1491XA Suicide attempt, initial encounter: Secondary | ICD-10-CM | POA: Diagnosis present

## 2015-03-17 DIAGNOSIS — F332 Major depressive disorder, recurrent severe without psychotic features: Secondary | ICD-10-CM | POA: Diagnosis not present

## 2015-03-17 DIAGNOSIS — R45851 Suicidal ideations: Secondary | ICD-10-CM | POA: Diagnosis not present

## 2015-03-17 DIAGNOSIS — T1491 Suicide attempt: Secondary | ICD-10-CM | POA: Diagnosis not present

## 2015-03-17 MED ORDER — MESALAMINE 1.2 G PO TBEC: 2.4000 g | DELAYED_RELEASE_TABLET | Freq: Two times a day (BID) | ORAL | Status: DC

## 2015-03-17 MED ORDER — FLUOXETINE HCL 10 MG PO CAPS: 10.0000 mg | ORAL_CAPSULE | Freq: Every day | ORAL | Status: DC

## 2015-03-17 MED ORDER — FLUOXETINE HCL 10 MG PO CAPS
10.0000 mg | ORAL_CAPSULE | Freq: Every day | ORAL | Status: DC
  Administered 2015-03-17: 10 mg via ORAL
  Filled 2015-03-17: qty 1

## 2015-03-17 MED ORDER — TRAZODONE HCL 50 MG PO TABS: 50.0000 mg | ORAL_TABLET | Freq: Every evening | ORAL | Status: DC | PRN

## 2015-03-17 NOTE — BHH Counselor (Signed)
03/17/15, Referral packet sent to [place], Rosana Hoes, Mikel Cella, [place], [place], [place], [place], [place], [place], and [place]. Marbeth Smedley, LPCA, LPCA, [place]  Counselor 03/17/2015 6:58 AM

## 2015-03-17 NOTE — ED Notes (Signed)
Reports called to [facility], [place].

## 2015-03-17 NOTE — ED Notes (Signed)
Pt in shower, meds delayed

## 2015-03-17 NOTE — BH Assessment (Signed)
[facility] Assessment Progress Note  Per Corena Pilgrim, MD, this pt requires psychiatric hospitalization at this time.  [facility] calls to report that pt has been accepted to their facility by Dr. Lucilla Edin.  Please call report to [phone].  This Probation officer spoke to this pt, who is currently under voluntary status, and she agrees to transfer.  Catalina Pizza, NP, concurs with decision.  Pt's nurse has been notified.  Pt is to be transported via Stacey Drain, [place] Triage Specialist [phone]

## 2015-03-17 NOTE — ED Notes (Addendum)
Pt is cooperative with a sitter due to [place]. Pt uses a walker to ambulate. She is asking to speak to her children. Pt has good eye contact. She denies SI and HI now and does contract for safety. Pt was incontinent of stool. She was assisted to the BR and had stool in her depends. Pt was cleaned up. She stated,"I do not know if I want to die I just want some help and can not seem to get it." pt stated her children live about 1 mile away. 9:30a- Pt stated she wanted to get washed up later.11:30a- Report to [facility]

## 2015-03-17 NOTE — Progress Notes (Signed)
Pt recommended for inpatient psychiatric treatment at this time, due to SI with attempting to pull trigger on gun twice. Patient also requesting skilled nursing placement. Pt would need to be psychiatrically stable before csw can pursue snf placement.   Belia Heman, [place] Work  [employer] [phone]

## 2015-03-17 NOTE — Progress Notes (Signed)
Dr. Lucilla Edin has accepted pt to [facility]. Report number for RN is [phone]. States bed is ready and pt can arrive anytime. Requests that they be informed if pt is coming voluntarily or will be under IVC. CSW stated currently pt is here voluntarily and Rosana Hoes asks to be updated when report is called as to the status of IVC v. voluntary should that change.  Informed [facility] TTS.  Sharren Bridge, MSW, LCSW Clinical Social Work, Disposition  03/17/2015 [phone]

## 2015-03-17 NOTE — Consult Note (Addendum)
[name] Consult   Reason for Consult:  Suicide attempt   Referring Physician:  EDP Patient Identification: Stacy Mcguire MRN:  [id number] Principal Diagnosis: MDD (major depressive disorder), recurrent severe, without psychosis ([place]) Diagnosis:   Patient Active Problem List   Diagnosis Date Noted  . MDD (major depressive disorder), recurrent severe, without psychosis ([place]) [F33.2] 03/17/2015    Priority: High  . Suicide attempt ([place]) [T14.91] 03/17/2015    Priority: High  . Protein-calorie malnutrition, severe ([place]) [E43] 03/04/2015  . Abdominal pain, generalized [R10.84]   . Metabolic acidosis [P53.6] 02/25/2015  . Hypokalemia [E87.6] 02/25/2015  . C. difficile colitis [A04.7] 02/23/2015  . Diarrhea [R19.7] 02/22/2015  . Sepsis ([place]) [A41.9] 02/22/2015  . Acute renal failure ([place]) [N17.9] 02/22/2015  . Acute hyponatremia [E87.1] 02/22/2015  . Thrombocytosis ([place]) [D47.3] 02/22/2015  . Hiatal hernia [K44.9] 01/30/2015  . Liver cyst [K76.89] 01/30/2015  . Weakness generalized [R53.1] 01/30/2015  . GERD [K21.9] 10/06/2008  . Ulcerative colitis ([place]) [K51.90] 10/06/2008    Total Time spent with patient: 35 minutes  Subjective:   Stacy Mcguire is a 81 y.o. female patient admitted with reports of putting a gun to her head and attempting to pull the trigger and shoot herself. Pt reports that she did indeed try to do so. Pt was seen and chart reviewed by NP/MD team this AM. She does affirm the above with suicidal ideation and is in agreement about coming into a hospital. Pt denies homicidal ideation and psychosis and does not appear to be responding to internal stimuli.  HPI:  I have reviewed and concur with HPI below, modified as follows: Stacy Mcguire is an 81 y.o. female presenting to [facility] accompanied by her son and daughter in law after a suicide attempt. Pt stated "I have been sick since June 1st". "Ever since then my brain doesn't work good anymore". "I  tried to slit my wrist and I couldn't even do that". Pt's son reported that she attempted to shoot herself with the gun but she could not fire the gun. Pt did not report any previous suicide attempts or a psychiatric history. Pt reported that she was prescribed Prozac in the past due to feeling weak; however she did not notice any changes. Pt reported that her current medical issues are a stressor. Pt is reporting the following depressive symptoms: despondent, fatigue, feelings of guilt and feeling worthless. Pt reported that she sleeps all the time and shared that she has lost approximately 30lbs in the past 5 months. Pt denies HI and AVH at this time. Pt did not report any alcohol or illicit substance abuse in the past year. Pt did not report any physical, sexual or emotional abuse.  Risk to Self: Suicidal Ideation: Yes-Currently Present Suicidal Intent: Yes-Currently Present Is patient at risk for suicide?: Yes Suicidal Plan?: Yes-Currently Present Specify Current Suicidal Plan: Pt reported that she tried to slit her wrist and attempted to shoot herself with a gun.  Access to Means: Yes Specify Access to Suicidal Means: Pt had access to a gun.  What has been your use of drugs/alcohol within the last 12 months?: No alcohol or drug use reported.  How many times?: 0 Other Self Harm Risks: No other self harm risk identified.  Triggers for Past Attempts: None known Intentional Self Injurious Behavior: None Risk to Others: Homicidal Ideation: No Thoughts of Harm to Others: No Current Homicidal Intent: No Current Homicidal Plan: No Access to Homicidal Means: No Identified Victim:  N/A History of harm to others?: No Assessment of Violence: On admission Violent Behavior Description: No violent behaviors observed. PT is calm and cooperative.  Does patient have access to weapons?: Yes (Comment) (Gun ) Criminal Charges Pending?: No Does patient have a court date: No Prior Inpatient Therapy: Prior  Inpatient Therapy: No Prior Therapy Dates: N/A Prior Therapy Facilty/Provider(s): N/A Reason for Treatment: N/A Prior Outpatient Therapy: Prior Outpatient Therapy: No Prior Therapy Dates: N/A Prior Therapy Facilty/Provider(s): N/A Reason for Treatment: N/A Does patient have an ACCT team?: No Does patient have Intensive In-House Services?  : No Does patient have [facility] services? : No Does patient have P4CC services?: No  Past Medical History:  Past Medical History  Diagnosis Date  . Arthritis   . Anemia, unspecified   . Depression   . GERD (gastroesophageal reflux disease)   . Diverticulosis 2010    Colonoscopy  . Internal hemorrhoids 2010    Colonoscopy  . Hiatal hernia 2010    EGD   . Ulcerative (chronic) proctitis ([place]) 2005    Colonoscopy   . Osteoarthritis   . Migraine   . Kidney disease, chronic, stage III (moderate, EGFR 30-59 ml/min)   . Shingles   . Hot flashes   . UTI (urinary tract infection)     Past Surgical History  Procedure Laterality Date  . Abdominal hysterectomy    . Knee surgery    . Mastoidectomy    . Tonsillectomy    . Nose surgery     Family History:  Family History  Problem Relation Age of Onset  . Breast cancer Mother   . Colon cancer Neg Hx   . Breast cancer Sister 89  . Stroke Mother 104  . Diabetes Son 76  . Prostate cancer Son    Social History:  History  Alcohol Use No     History  Drug Use No    Social History   Social History  . Marital Status: Divorced    Spouse Name: N/A  . Number of Children: N/A  . Years of Education: N/A   Social History Main Topics  . Smoking status: Never Smoker   . Smokeless tobacco: Never Used  . Alcohol Use: No  . Drug Use: No  . Sexual Activity: Not Asked   Other Topics Concern  . None   Social History Narrative    Diet:       Do you drink/ eat things with caffeine?  Yes Chocolate, coffee      Marital status: Divorced, Widowed                              What year were you  married ? [id number]      Do you live in a house, apartment,assistred living, condo, trailer, etc.)? Townhouse      Is it one or more stories? 1        How many persons live in your home ? 1      Do you have any pets in your home ?(please list) Yes, Dog      Current or past profession: Editor, commissioning      Do you exercise? some                             Type & how often: house work, yard work      Do you have a  living will? Yes      Do you have a DNR form?  Yes                     If not, do you want to discuss one?       Do you have signed POA?HPOA forms?   Yes              If so, please bring to your        appointment         Additional Social History:    History of alcohol / drug use?: No history of alcohol / drug abuse                     Allergies:   Allergies  Allergen Reactions  . Neosporin Af [Miconazole] Itching    Labs:  Results for orders placed or performed during the hospital encounter of 03/16/15 (from the past 48 hour(s))  Comprehensive metabolic panel     Status: Abnormal   Collection Time: 03/16/15  7:20 PM  Result Value Ref Range   Sodium 132 (L) 135 - 145 mmol/L   Potassium 3.4 (L) 3.5 - 5.1 mmol/L   Chloride 99 (L) 101 - 111 mmol/L   CO2 25 22 - 32 mmol/L   Glucose, Bld 117 (H) 65 - 99 mg/dL   BUN 22 (H) 6 - 20 mg/dL   Creatinine, Ser 1.00 0.44 - 1.00 mg/dL   Calcium 9.8 8.9 - 10.3 mg/dL   Total Protein 6.2 (L) 6.5 - 8.1 g/dL   Albumin 3.1 (L) 3.5 - 5.0 g/dL   AST 21 15 - 41 U/L   ALT 27 14 - 54 U/L   Alkaline Phosphatase 70 38 - 126 U/L   Total Bilirubin 0.8 0.3 - 1.2 mg/dL   GFR calc non Af Amer 50 (L) >60 mL/min   GFR calc Af Amer 58 (L) >60 mL/min    Comment: (NOTE) The eGFR has been calculated using the CKD EPI equation. This calculation has not been validated in all clinical situations. eGFR's persistently <60 mL/min signify possible Chronic Kidney Disease.    Anion gap 8 5 - 15  Ethanol (ETOH)     Status:  None   Collection Time: 03/16/15  7:20 PM  Result Value Ref Range   Alcohol, Ethyl (B) <5 <5 mg/dL    Comment:        LOWEST DETECTABLE LIMIT FOR SERUM ALCOHOL IS 5 mg/dL FOR MEDICAL PURPOSES ONLY   CBC     Status: Abnormal   Collection Time: 03/16/15  7:20 PM  Result Value Ref Range   WBC 8.8 4.0 - 10.5 K/uL   RBC 3.92 3.87 - 5.11 MIL/uL   Hemoglobin 10.8 (L) 12.0 - 15.0 g/dL   HCT 33.2 (L) 36.0 - 46.0 %   MCV 84.7 78.0 - 100.0 fL   MCH 27.6 26.0 - 34.0 pg   MCHC 32.5 30.0 - 36.0 g/dL   RDW 17.4 (H) 11.5 - 15.5 %   Platelets 341 150 - 400 K/uL  Acetaminophen level     Status: Abnormal   Collection Time: 03/16/15  7:20 PM  Result Value Ref Range   Acetaminophen (Tylenol), Serum <10 (L) 10 - 30 ug/mL    Comment:        THERAPEUTIC CONCENTRATIONS VARY SIGNIFICANTLY. A RANGE OF 10-30 ug/mL MAY BE AN EFFECTIVE CONCENTRATION FOR MANY PATIENTS. HOWEVER, SOME ARE BEST TREATED AT CONCENTRATIONS OUTSIDE  THIS RANGE. ACETAMINOPHEN CONCENTRATIONS >150 ug/mL AT 4 HOURS AFTER INGESTION AND >50 ug/mL AT 12 HOURS AFTER INGESTION ARE OFTEN ASSOCIATED WITH TOXIC REACTIONS.   Salicylate level     Status: None   Collection Time: 03/16/15  7:20 PM  Result Value Ref Range   Salicylate Lvl <6.0 2.8 - 30.0 mg/dL  Urine rapid drug screen (hosp performed) (Not at [facility])     Status: Abnormal   Collection Time: 03/16/15  9:34 PM  Result Value Ref Range   Opiates NONE DETECTED NONE DETECTED   Cocaine NONE DETECTED NONE DETECTED   Benzodiazepines POSITIVE (A) NONE DETECTED   Amphetamines NONE DETECTED NONE DETECTED   Tetrahydrocannabinol NONE DETECTED NONE DETECTED   Barbiturates NONE DETECTED NONE DETECTED    Comment:        DRUG SCREEN FOR MEDICAL PURPOSES ONLY.  IF CONFIRMATION IS NEEDED FOR ANY PURPOSE, NOTIFY LAB WITHIN 5 DAYS.        LOWEST DETECTABLE LIMITS FOR URINE DRUG SCREEN Drug Class       Cutoff (ng/mL) Amphetamine      1000 Barbiturate      200 Benzodiazepine    630 Tricyclics       160 Opiates          300 Cocaine          300 THC              50     Current Facility-Administered Medications  Medication Dose Route Frequency Provider Last Rate Last Dose  . FLUoxetine (PROZAC) capsule 10 mg  10 mg Oral Daily Violia Knopf   10 mg at 03/17/15 1226  . traZODone (DESYREL) tablet 50 mg  50 mg Oral QHS PRN Shailene Demonbreun       Current Outpatient Prescriptions  Medication Sig Dispense Refill  . acetaminophen (TYLENOL) 325 MG tablet Take 2 tablets (650 mg total) by mouth every 6 (six) hours as needed for mild pain (or Fever >/= 101).    Marland Kitchen alum & mag hydroxide-simeth (MAALOX/MYLANTA) 200-200-20 MG/5ML suspension Take 30 mLs by mouth every 6 (six) hours as needed for indigestion or heartburn.    . diazepam (VALIUM) 2 MG tablet Take one tablet by mouth at bedtime for sedation (Patient taking differently: Take 2 mg by mouth at bedtime as needed for sedation. ) 30 tablet 0  . hydrocortisone cream 0.5 % Apply 1 application topically 2 (two) times daily as needed for itching.    . mesalamine (LIALDA) 1.2 G EC tablet Take 4 tablets (4.8 g total) by mouth daily with breakfast. (Patient taking differently: Take 2.4 g by mouth 2 (two) times daily. )    . ondansetron (ZOFRAN) 4 MG tablet Take 1 tablet (4 mg total) by mouth daily as needed for nausea. 30 tablet 1  . Probiotic Product (ALIGN PO) Take 1 capsule by mouth daily.    . sertraline (ZOLOFT) 50 MG tablet Take 50 mg by mouth at bedtime.   1  . traMADol (ULTRAM) 50 MG tablet Take one tablet by mouth every 4 hours as needed for pain 180 tablet 0  . Vitamin D, Ergocalciferol, (DRISDOL) 50000 UNITS CAPS capsule Take 50,000 Units by mouth daily.    . fidaxomicin (DIFICID) 200 MG TABS tablet Take 1 tablet (200 mg total) by mouth 2 (two) times daily. For 9 days (Patient not taking: Reported on 03/16/2015) 18 tablet 0    Musculoskeletal: Strength & Muscle Tone: within normal limits Gait & Station: normal Patient  leans: N/A  Psychiatric Specialty Exam: Review of Systems  Psychiatric/Behavioral: Positive for depression and suicidal ideas. The patient is nervous/anxious.   All other systems reviewed and are negative.   Blood pressure 132/77, pulse 103, temperature 98.1 F (36.7 C), temperature source Oral, resp. rate 18, SpO2 99 %.There is no weight on file to calculate BMI.  General Appearance: Casual and Fairly Groomed  Engineer, water::  Good  Speech:  Clear and Coherent and Normal Rate  Volume:  Normal  Mood:  Anxious and Depressed  Affect:  Appropriate and Congruent  Thought Process:  Coherent and Goal Directed  Orientation:  Full (Time, Place, and Person)  Thought Content:  WDL  Suicidal Thoughts:  Yes.  with intent/plan  Homicidal Thoughts:  No  Memory:  Immediate;   Fair Recent;   Fair Remote;   Fair  Judgement:  Fair  Insight:  Fair  Psychomotor Activity:  Normal  Concentration:  Fair  Recall:  [employer] of Knowledge:Fair  Language: Fair  Akathisia:  No  Handed:    AIMS (if indicated):     Assets:  Communication Skills Desire for Improvement Resilience Social Support  ADL's:  Intact  Cognition: WNL  Sleep:      Treatment Plan Summary: Daily contact with patient to assess and evaluate symptoms and progress in treatment and Medication management  Disposition: Recommend psychiatric Inpatient admission when medically cleared.  Benjamine Mola, FNP-BC 03/17/2015 12:53 PM Patient seen face-to-face for psychiatric evaluation, chart reviewed and case discussed with the physician extender and developed treatment plan. Reviewed the information documented and agree with the treatment plan. Corena Pilgrim, MD

## 2015-03-17 NOTE — BH Assessment (Signed)
Received a copy of power of attorney from pt's son as well as his business card. Pt's son has requested that pt remain local.

## 2015-03-17 NOTE — ED Notes (Signed)
Called [facility] for transport. 

## 2015-10-26 ENCOUNTER — Encounter: Payer: Self-pay | Admitting: Gastroenterology

## 2016-02-25 ENCOUNTER — Encounter (HOSPITAL_COMMUNITY): Payer: Self-pay | Admitting: *Deleted

## 2016-02-25 ENCOUNTER — Emergency Department (HOSPITAL_COMMUNITY)
Admission: EM | Admit: 2016-02-25 | Discharge: 2016-02-25 | Disposition: A | Discharge status: Home / Self Care | Payer: Medicare PPO | Attending: Emergency Medicine | Admitting: Emergency Medicine

## 2016-02-25 ENCOUNTER — Emergency Department (HOSPITAL_COMMUNITY): Payer: Medicare PPO

## 2016-02-25 DIAGNOSIS — R531 Weakness: Secondary | ICD-10-CM | POA: Insufficient documentation

## 2016-02-25 DIAGNOSIS — Z79899 Other long term (current) drug therapy: Secondary | ICD-10-CM | POA: Insufficient documentation

## 2016-02-25 DIAGNOSIS — R112 Nausea with vomiting, unspecified: Secondary | ICD-10-CM | POA: Diagnosis not present

## 2016-02-25 DIAGNOSIS — J984 Other disorders of lung: Secondary | ICD-10-CM | POA: Diagnosis not present

## 2016-02-25 LAB — URINALYSIS, ROUTINE W REFLEX MICROSCOPIC
GLUCOSE, UA: NEGATIVE mg/dL
HGB URINE DIPSTICK: NEGATIVE
Ketones, ur: NEGATIVE mg/dL
LEUKOCYTES UA: NEGATIVE
Nitrite: NEGATIVE
PH: 6 (ref 5.0–8.0)
Protein, ur: NEGATIVE mg/dL
Specific Gravity, Urine: 1.02 (ref 1.005–1.030)

## 2016-02-25 LAB — BASIC METABOLIC PANEL
Anion gap: 11 (ref 5–15)
BUN: 29 mg/dL — ABNORMAL HIGH (ref 6–20)
CO2: 22 mmol/L (ref 22–32)
Calcium: 9.6 mg/dL (ref 8.9–10.3)
Chloride: 101 mmol/L (ref 101–111)
Creatinine, Ser: 0.96 mg/dL (ref 0.44–1.00)
GFR calc Af Amer: >60 mL/min (ref >60)
GFR calc non Af Amer: 52 mL/min — ABNORMAL LOW (ref >60)
Glucose, Bld: 114 mg/dL — ABNORMAL HIGH (ref 65–99)
POTASSIUM: 3.3 mmol/L — AB (ref 3.5–5.1)
SODIUM: 134 mmol/L — AB (ref 135–145)

## 2016-02-25 LAB — HEPATIC FUNCTION PANEL
ALK PHOS: 67 U/L (ref 38–126)
ALT: 20 U/L (ref 14–54)
AST: 24 U/L (ref 15–41)
Albumin: 4.2 g/dL (ref 3.5–5.0)
Bilirubin, Direct: <0.1 mg/dL — ABNORMAL LOW (ref 0.1–0.5)
TOTAL PROTEIN: 7.4 g/dL (ref 6.5–8.1)
Total Bilirubin: 0.6 mg/dL (ref 0.3–1.2)

## 2016-02-25 LAB — LIPASE, BLOOD: Lipase: 34 U/L (ref 11–51)

## 2016-02-25 LAB — CBG MONITORING, ED: Glucose-Capillary: 112 mg/dL — ABNORMAL HIGH (ref 65–99)

## 2016-02-25 LAB — CBC
HEMATOCRIT: 41.2 % (ref 36.0–46.0)
Hemoglobin: 13.5 g/dL (ref 12.0–15.0)
MCH: 26.6 pg (ref 26.0–34.0)
MCHC: 32.8 g/dL (ref 30.0–36.0)
MCV: 81.1 fL (ref 78.0–100.0)
Platelets: 391 10*3/uL (ref 150–400)
RBC: 5.08 MIL/uL (ref 3.87–5.11)
RDW: 15.6 % — ABNORMAL HIGH (ref 11.5–15.5)
WBC: 16.1 10*3/uL — AB (ref 4.0–10.5)

## 2016-02-25 MED ORDER — SODIUM CHLORIDE 0.9 % IV SOLN
INTRAVENOUS | Status: DC
  Administered 2016-02-25: 14:00:00 via INTRAVENOUS

## 2016-02-25 MED ORDER — SODIUM CHLORIDE 0.9 % IV BOLUS (SEPSIS)
250.0000 mL | Freq: Once | INTRAVENOUS | Status: AC
  Administered 2016-02-25: 250 mL via INTRAVENOUS

## 2016-02-25 NOTE — ED Provider Notes (Signed)
Stacy Mcguire Provider Note   CSN: [id number] Arrival date & time: 02/25/16  1114     History   Chief Complaint Chief Complaint  Patient presents with  . Weakness    HPI Stacy Mcguire is a 82 y.o. female.  Patient followed by GI medicine. Has been on Lialda for many years for colitis. Recently switched over to sulfasalazine. Patient was taking in as directed. But started to have feeling of weakness nausea and vomiting. Coffee-ground emesis no red blood. No blood in bowel movements. This was stopped on Monday area patient still feeling weak vomiting showing some signs of improvement. Patient does have antinausea medicine at home. Patient is followed by Dr. Amedeo Plenty of [facility].      Past Medical History:  Diagnosis Date  . Anemia, unspecified   . Arthritis   . Depression   . Diverticulosis 2010   Colonoscopy  . GERD (gastroesophageal reflux disease)   . Hiatal hernia 2010   EGD   . Hot flashes   . Internal hemorrhoids 2010   Colonoscopy  . Kidney disease, chronic, stage III (moderate, EGFR 30-59 ml/min)   . Migraine   . Osteoarthritis   . Shingles   . 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 UTI (urinary tract infection)     Patient Active Problem List   Diagnosis Date Noted  . MDD (major depressive disorder), recurrent severe, without psychosis ([place]) 03/17/2015  . Suicide attempt ([place]) 03/17/2015  . Protein-calorie malnutrition, severe ([place]) 03/04/2015  . Abdominal pain, generalized   . Metabolic acidosis 59/93/5701  . Hypokalemia 02/25/2015  . C. difficile colitis 02/23/2015  . Diarrhea 02/22/2015  . Sepsis ([place]) 02/22/2015  . Acute renal failure ([place]) 02/22/2015  . Acute hyponatremia 02/22/2015  . Thrombocytosis ([place]) 02/22/2015  . Hiatal hernia 01/30/2015  . Liver cyst 01/30/2015  . Weakness generalized 01/30/2015  . GERD 10/06/2008  . Ulcerative colitis ([place]) 10/06/2008    Past Surgical History:  Procedure Laterality Date    . ABDOMINAL HYSTERECTOMY    . KNEE SURGERY    . MASTOIDECTOMY    . NOSE SURGERY    . TONSILLECTOMY      OB History    No data available       Home Medications    Prior to Admission medications   Medication Sig Start Date End Date Taking? Authorizing Provider  FLUoxetine (PROZAC) 10 MG capsule Take 1 capsule (10 mg total) by mouth daily. 03/17/15   Benjamine Mola, FNP  mesalamine (LIALDA) 1.2 G EC tablet Take 2 tablets (2.4 g total) by mouth 2 (two) times daily. 03/17/15   Benjamine Mola, FNP  traMADol Veatrice Bourbon) 50 MG tablet Take one tablet by mouth every 4 hours as needed for pain 03/08/15   Tiffany L Reed, DO  traZODone (DESYREL) 50 MG tablet Take 1 tablet (50 mg total) by mouth at bedtime as needed for sleep. 03/17/15   Benjamine Mola, FNP    Family History Family History  Problem Relation Age of Onset  . Breast cancer Mother   . Stroke Mother 95  . Breast cancer Sister 50  . Diabetes Son 59  . Prostate cancer Son   . Colon cancer Neg Hx     Social History Social History  Substance Use Topics  . Smoking status: Never Smoker  . Smokeless tobacco: Never Used  . Alcohol use No     Allergies   Neosporin af [miconazole]   Review  of Systems Review of Systems  Constitutional: Negative for fever.  HENT: Negative for congestion.   Eyes: Negative for redness.  Respiratory: Negative for shortness of breath.   Cardiovascular: Negative for chest pain.  Gastrointestinal: Positive for nausea and vomiting. Negative for abdominal pain and blood in stool.  Genitourinary: Negative for dysuria.  Skin: Negative for rash.  Neurological: Positive for weakness.  Hematological: Does not bruise/bleed easily.  Psychiatric/Behavioral: Negative for confusion.     Physical Exam Updated Vital Signs BP 136/84 (BP Location: Left Arm)   Pulse 95   Temp 98 F (36.7 C) (Oral)   Resp 16   Ht '5\' 7"'  (1.702 m)   Wt 50.8 kg   SpO2 94%   BMI 17.54 kg/m   Physical Exam   Constitutional: She is oriented to person, place, and time. She appears well-developed and well-nourished. No distress.  HENT:  Head: Normocephalic and atraumatic.  Eyes: EOM are normal. Pupils are equal, round, and reactive to light.  Neck: Normal range of motion. Neck supple.  Cardiovascular: Normal rate, regular rhythm and normal heart sounds.   Pulmonary/Chest: Effort normal and breath sounds normal.  Abdominal: Soft. Bowel sounds are normal. There is no tenderness.  Neurological: She is alert and oriented to person, place, and time. No cranial nerve deficit. She exhibits normal muscle tone. Coordination normal.  Skin: Skin is warm.  Nursing note and vitals reviewed.    ED Treatments / Results  Labs (all labs ordered are listed, but only abnormal results are displayed) Labs Reviewed  BASIC METABOLIC PANEL - Abnormal; Notable for the following:       Result Value   Sodium 134 (*)    Potassium 3.3 (*)    Glucose, Bld 114 (*)    BUN 29 (*)    GFR calc non Af Amer 52 (*)    All other components within normal limits  CBC - Abnormal; Notable for the following:    WBC 16.1 (*)    RDW 15.6 (*)    All other components within normal limits  URINALYSIS, ROUTINE W REFLEX MICROSCOPIC (NOT AT [facility]) - Abnormal; Notable for the following:    Bilirubin Urine SMALL (*)    All other components within normal limits  HEPATIC FUNCTION PANEL - Abnormal; Notable for the following:    Bilirubin, Direct <0.1 (*)    All other components within normal limits  CBG MONITORING, ED - Abnormal; Notable for the following:    Glucose-Capillary 112 (*)    All other components within normal limits  LIPASE, BLOOD    EKG  EKG Interpretation  Date/Time:  Friday February 25 2016 11:45:36 EDT Ventricular Rate:  108 PR Interval:    QRS Duration: 94 QT Interval:  330 QTC Calculation: 443 R Axis:   1 Text Interpretation:  Sinus tachycardia Atrial premature complexes Anterior infarct, old Minimal ST  depression, lateral leads Confirmed by Denai Caba  MD, Morelia Cassells [phone]) on 02/25/2016 2:27:02 PM       Radiology Ct Chest Wo Contrast  Result Date: 02/25/2016 CLINICAL DATA:  Started on sulfa Dr on Saturday with subsequent weakness and vomiting (brown emesis). Continued vomiting and poor appetite after discontinuing new medication. EXAM: CT CHEST WITHOUT CONTRAST TECHNIQUE: Multidetector CT imaging of the chest was performed following the standard protocol without IV contrast. COMPARISON:  CT abdomen dated 02/22/2015 and CT abdomen dated 01/29/2015. FINDINGS: Cardiovascular: Heart size is normal. No pericardial effusion. Coronary artery calcifications noted. Scattered atherosclerotic changes also seen along the walls  of the normal caliber thoracic aorta. Mediastinum/Nodes: No mass or enlarged lymph nodes seen in the mediastinum, perihilar or axillary regions. Sub cm hypodense focus within the right thyroid lobe is most likely a benign colloid cyst. Stable hiatal hernia, moderate to large in size. More proximal esophagus appears normal. Lungs/Pleura: Scattered small nodular consolidations at each lung base, most prominent at the posterior aspect of the right lower lobe. No pleural effusion or pneumothorax. Upper Abdomen: Chronic cyst with peripheral calcifications within the left liver lobe is not significantly changed in size, again measuring 8.2 cm greatest dimension. No acute abnormality. Musculoskeletal: Degenerative changes throughout the scoliotic thoracolumbar spine. No acute or suspicious osseous finding. IMPRESSION: 1. Scattered small nodular consolidations at each lung base, most prominent at the posterior aspect of the right lower lobe. Some component is favored to be sequela of acute and/or chronic aspiration. Some component is likely chronic nodular scarring or atelectasis. Neoplastic process is considered much less likely but would consider follow-up chest CT in 3-6 months to ensure stability or  resolution. 2. Hiatal hernia, moderate to large in size, stable. 3. Aortic atherosclerosis. 4. Chronic hepatic cyst. Electronically Signed   By: Franki Cabot M.D.   On: 02/25/2016 15:45   Dg Chest Port 1 View  Result Date: 02/25/2016 CLINICAL DATA:  Weakness, nausea/vomiting EXAM: PORTABLE CHEST 1 VIEW COMPARISON:  03/17/2015 FINDINGS: Chronic interstitial markings/emphysematous changes. Biapical pleural-parenchymal scarring. Mild left lower lobe opacity, likely atelectasis, pneumonia not excluded. No pleural effusion or pneumothorax. The heart is normal in size. IMPRESSION: Mild left lower lobe opacity, likely atelectasis, pneumonia not excluded. Electronically Signed   By: Julian Hy M.D.   On: 02/25/2016 14:29    Procedures Procedures (including critical care time)  Medications Ordered in ED Medications  0.9 %  sodium chloride infusion ( Intravenous New Bag/Given 02/25/16 1402)  sodium chloride 0.9 % bolus 250 mL (0 mLs Intravenous Stopped 02/25/16 1356)     Initial Impression / Assessment and Plan / ED Course  I have reviewed the triage vital signs and the nursing notes.  Pertinent labs & imaging results that were available during my care of the patient were reviewed by me and considered in my medical decision making (see chart for details).  Clinical Course   The patient presented with complaint of generalized weakness vomiting. Coffee-ground at times. Started over the weekend. Patient here with IV fluids and workup feeling much better. No acute explanation for the coffee-ground emesis other than perhaps the fact that she was on it's sulfasalazine which she has stopped and probably had GI upset from that. She was started back on her old medications doing much better. Chest x-ray raised some concerns for pneumonia but CT chest was negative for pneumonia. Patient stable for discharge home. She followed by GI medicine for her colitis. And she also has a primary care doctor.   Final  Clinical Impressions(s) / ED Diagnoses   Final diagnoses:  Weakness    New Prescriptions New Prescriptions   No medications on file     Fredia Sorrow, MD 02/25/16 [phone]

## 2016-02-25 NOTE — ED Notes (Signed)
Pt's family is leaving to go get lunch but will be back shortly. Contact information was provided in case they need to be reached: Richardson Landry [phone]

## 2016-02-25 NOTE — ED Notes (Signed)
Pt returned from CT °

## 2016-02-25 NOTE — Discharge Instructions (Signed)
Continue current medications. Arrange follow-up with your GI Dr. and your regular doctor. Take antinausea medicine as needed. Return for any new or worse symptoms. Return for vomiting of any red blood.

## 2016-02-25 NOTE — ED Triage Notes (Signed)
Pt started on Sulfa drug on Sat, pt became weaker, throwing up brown emesis, started back on reg meds on Monday, continues to throw up and has poor appetite, feels she has difficulty swallowing at intervals, pt stays in bed due to falls.

## 2016-02-25 NOTE — ED Notes (Signed)
X-ray at bedside

## 2016-11-07 ENCOUNTER — Encounter (HOSPITAL_COMMUNITY): Payer: Self-pay | Admitting: Radiology

## 2016-11-07 ENCOUNTER — Emergency Department (HOSPITAL_COMMUNITY): Payer: Medicare PPO

## 2016-11-07 ENCOUNTER — Inpatient Hospital Stay (HOSPITAL_COMMUNITY)
Admission: EM | Admit: 2016-11-07 | Discharge: 2016-11-17 | DRG: 200 | Disposition: A | Discharge status: Other Acute Inpatient Hospital | Payer: Medicare PPO | Attending: Surgery | Admitting: Surgery

## 2016-11-07 DIAGNOSIS — E440 Moderate protein-calorie malnutrition: Secondary | ICD-10-CM | POA: Diagnosis present

## 2016-11-07 DIAGNOSIS — X749XXA Intentional self-harm by unspecified firearm discharge, initial encounter: Secondary | ICD-10-CM | POA: Diagnosis not present

## 2016-11-07 DIAGNOSIS — S271XXA Traumatic hemothorax, initial encounter: Secondary | ICD-10-CM | POA: Diagnosis present

## 2016-11-07 DIAGNOSIS — K5900 Constipation, unspecified: Secondary | ICD-10-CM | POA: Diagnosis not present

## 2016-11-07 DIAGNOSIS — I4891 Unspecified atrial fibrillation: Secondary | ICD-10-CM | POA: Diagnosis present

## 2016-11-07 DIAGNOSIS — T1491XA Suicide attempt, initial encounter: Secondary | ICD-10-CM | POA: Diagnosis not present

## 2016-11-07 DIAGNOSIS — S299XXA Unspecified injury of thorax, initial encounter: Secondary | ICD-10-CM

## 2016-11-07 DIAGNOSIS — R45851 Suicidal ideations: Secondary | ICD-10-CM | POA: Diagnosis present

## 2016-11-07 DIAGNOSIS — Z915 Personal history of self-harm: Secondary | ICD-10-CM

## 2016-11-07 DIAGNOSIS — S2239XA Fracture of one rib, unspecified side, initial encounter for closed fracture: Secondary | ICD-10-CM | POA: Diagnosis present

## 2016-11-07 DIAGNOSIS — E871 Hypo-osmolality and hyponatremia: Secondary | ICD-10-CM | POA: Diagnosis present

## 2016-11-07 DIAGNOSIS — I360 Nonrheumatic tricuspid (valve) stenosis: Secondary | ICD-10-CM | POA: Diagnosis not present

## 2016-11-07 DIAGNOSIS — S21132A Puncture wound without foreign body of left front wall of thorax without penetration into thoracic cavity, initial encounter: Secondary | ICD-10-CM | POA: Diagnosis present

## 2016-11-07 DIAGNOSIS — S2691XA Contusion of heart, unspecified with or without hemopericardium, initial encounter: Secondary | ICD-10-CM | POA: Diagnosis present

## 2016-11-07 DIAGNOSIS — W3400XA Accidental discharge from unspecified firearms or gun, initial encounter: Secondary | ICD-10-CM

## 2016-11-07 DIAGNOSIS — Z66 Do not resuscitate: Secondary | ICD-10-CM | POA: Diagnosis present

## 2016-11-07 DIAGNOSIS — Z681 Body mass index (BMI) 19 or less, adult: Secondary | ICD-10-CM | POA: Diagnosis not present

## 2016-11-07 DIAGNOSIS — J942 Hemothorax: Secondary | ICD-10-CM

## 2016-11-07 DIAGNOSIS — F329 Major depressive disorder, single episode, unspecified: Secondary | ICD-10-CM | POA: Diagnosis present

## 2016-11-07 DIAGNOSIS — J939 Pneumothorax, unspecified: Secondary | ICD-10-CM

## 2016-11-07 DIAGNOSIS — G4700 Insomnia, unspecified: Secondary | ICD-10-CM | POA: Diagnosis not present

## 2016-11-07 DIAGNOSIS — F332 Major depressive disorder, recurrent severe without psychotic features: Secondary | ICD-10-CM | POA: Diagnosis not present

## 2016-11-07 DIAGNOSIS — R079 Chest pain, unspecified: Secondary | ICD-10-CM | POA: Diagnosis not present

## 2016-11-07 LAB — PREPARE FRESH FROZEN PLASMA
Unit division: 0
Unit division: 0

## 2016-11-07 LAB — CBC
HCT: 32.9 % — ABNORMAL LOW (ref 36.0–46.0)
Hemoglobin: 10.4 g/dL — ABNORMAL LOW (ref 12.0–15.0)
MCH: 29.5 pg (ref 26.0–34.0)
MCHC: 31.6 g/dL (ref 30.0–36.0)
MCV: 93.2 fL (ref 78.0–100.0)
PLATELETS: 282 10*3/uL (ref 150–400)
RBC: 3.53 MIL/uL — ABNORMAL LOW (ref 3.87–5.11)
RDW: 14.9 % (ref 11.5–15.5)
WBC: 17.5 10*3/uL — AB (ref 4.0–10.5)

## 2016-11-07 LAB — BPAM FFP
BLOOD PRODUCT EXPIRATION DATE: 201806302359
Blood Product Expiration Date: 201806302359
ISSUE DATE / TIME: 201806121753
ISSUE DATE / TIME: 201806121753
UNIT TYPE AND RH: 600
UNIT TYPE AND RH: 6200

## 2016-11-07 LAB — COMPREHENSIVE METABOLIC PANEL
ALT: 28 U/L (ref 14–54)
AST: 70 U/L — AB (ref 15–41)
Albumin: 3.3 g/dL — ABNORMAL LOW (ref 3.5–5.0)
Alkaline Phosphatase: 43 U/L (ref 38–126)
Anion gap: 10 (ref 5–15)
BUN: 39 mg/dL — ABNORMAL HIGH (ref 6–20)
CALCIUM: 8.7 mg/dL — AB (ref 8.9–10.3)
CHLORIDE: 106 mmol/L (ref 101–111)
CO2: 19 mmol/L — ABNORMAL LOW (ref 22–32)
CREATININE: 1.53 mg/dL — AB (ref 0.44–1.00)
GFR calc Af Amer: 34 mL/min — ABNORMAL LOW (ref >60)
GFR calc non Af Amer: 30 mL/min — ABNORMAL LOW (ref >60)
Glucose, Bld: 240 mg/dL — ABNORMAL HIGH (ref 65–99)
Potassium: 3.8 mmol/L (ref 3.5–5.1)
Sodium: 135 mmol/L (ref 135–145)
Total Bilirubin: 0.8 mg/dL (ref 0.3–1.2)
Total Protein: 5.9 g/dL — ABNORMAL LOW (ref 6.5–8.1)

## 2016-11-07 LAB — I-STAT CHEM 8, ED
BUN: 43 mg/dL — AB (ref 6–20)
Calcium, Ion: 1.18 mmol/L (ref 1.15–1.40)
Chloride: 105 mmol/L (ref 101–111)
Creatinine, Ser: 1.4 mg/dL — ABNORMAL HIGH (ref 0.44–1.00)
Glucose, Bld: 232 mg/dL — ABNORMAL HIGH (ref 65–99)
HEMATOCRIT: 33 % — AB (ref 36.0–46.0)
Hemoglobin: 11.2 g/dL — ABNORMAL LOW (ref 12.0–15.0)
Potassium: 3.9 mmol/L (ref 3.5–5.1)
Sodium: 138 mmol/L (ref 135–145)
TCO2: 22 mmol/L (ref 0–100)

## 2016-11-07 LAB — PROTIME-INR
INR: 0.96
Prothrombin Time: 12.7 seconds (ref 11.4–15.2)

## 2016-11-07 LAB — I-STAT CG4 LACTIC ACID, ED: Lactic Acid, Venous: 2.43 mmol/L (ref 0.5–1.9)

## 2016-11-07 LAB — SALICYLATE LEVEL: Salicylate Lvl: <7 mg/dL (ref 2.8–30.0)

## 2016-11-07 LAB — CDS SEROLOGY

## 2016-11-07 LAB — ACETAMINOPHEN LEVEL: Acetaminophen (Tylenol), Serum: <10 ug/mL — ABNORMAL LOW (ref 10–30)

## 2016-11-07 LAB — ABO/RH: ABO/RH(D): A POS

## 2016-11-07 LAB — ETHANOL: Alcohol, Ethyl (B): <5 mg/dL (ref <5)

## 2016-11-07 MED ORDER — HYDROCODONE-ACETAMINOPHEN 5-325 MG PO TABS: 2.0000 | ORAL_TABLET | ORAL | Status: DC | PRN

## 2016-11-07 MED ORDER — HYDROCODONE-ACETAMINOPHEN 5-325 MG PO TABS: 1.0000 | ORAL_TABLET | ORAL | Status: DC | PRN

## 2016-11-07 MED ORDER — IOPAMIDOL (ISOVUE-300) INJECTION 61%
100.0000 mL | Freq: Once | INTRAVENOUS | Status: AC | PRN
  Administered 2016-11-07: 100 mL via INTRAVENOUS

## 2016-11-07 MED ORDER — ACETAMINOPHEN 325 MG PO TABS: 650.0000 mg | ORAL_TABLET | ORAL | Status: DC | PRN

## 2016-11-07 MED ORDER — SODIUM CHLORIDE 0.9 % IV SOLN
INTRAVENOUS | Status: DC
  Administered 2016-11-07 – 2016-11-08 (×2): via INTRAVENOUS

## 2016-11-07 MED ORDER — ONDANSETRON HCL 4 MG PO TABS
4.0000 mg | ORAL_TABLET | Freq: Four times a day (QID) | ORAL | Status: DC | PRN
  Administered 2016-11-09 – 2016-11-16 (×4): 4 mg via ORAL
  Filled 2016-11-07 (×4): qty 1

## 2016-11-07 MED ORDER — MESALAMINE 1.2 G PO TBEC
4.8000 g | DELAYED_RELEASE_TABLET | Freq: Every day | ORAL | Status: DC
  Administered 2016-11-08 – 2016-11-17 (×10): 4.8 g via ORAL
  Filled 2016-11-07 (×10): qty 4

## 2016-11-07 MED ORDER — MORPHINE SULFATE (PF) 4 MG/ML IV SOLN: 1.0000 mg | INTRAVENOUS | Status: DC | PRN

## 2016-11-07 MED ORDER — MORPHINE SULFATE (PF) 4 MG/ML IV SOLN: 2.0000 mg | INTRAVENOUS | Status: DC | PRN

## 2016-11-07 MED ORDER — MORPHINE SULFATE (PF) 4 MG/ML IV SOLN: 3.0000 mg | INTRAVENOUS | Status: DC | PRN

## 2016-11-07 MED ORDER — ONDANSETRON HCL 4 MG/2ML IJ SOLN
4.0000 mg | Freq: Four times a day (QID) | INTRAMUSCULAR | Status: DC | PRN
  Administered 2016-11-08 – 2016-11-11 (×6): 4 mg via INTRAVENOUS
  Filled 2016-11-07 (×6): qty 2
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
## 2016-11-07 NOTE — ED Notes (Signed)
Pt to CT at this time.
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
## 2016-11-07 NOTE — ED Notes (Signed)
Pt talking with family at bedside.
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
## 2016-11-07 NOTE — ED Notes (Signed)
Pt returned from CT.  Pt remains alert and oriented x's 3.  Continues to deny any pain at this time.  [employer] at bedside
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
## 2016-11-08 ENCOUNTER — Encounter (HOSPITAL_COMMUNITY): Payer: Self-pay

## 2016-11-08 ENCOUNTER — Inpatient Hospital Stay (HOSPITAL_COMMUNITY): Payer: Medicare PPO

## 2016-11-08 DIAGNOSIS — J942 Hemothorax: Secondary | ICD-10-CM

## 2016-11-08 DIAGNOSIS — I4891 Unspecified atrial fibrillation: Secondary | ICD-10-CM

## 2016-11-08 LAB — URINALYSIS, ROUTINE W REFLEX MICROSCOPIC
Bilirubin Urine: NEGATIVE
Glucose, UA: NEGATIVE mg/dL
Hgb urine dipstick: NEGATIVE
KETONES UR: NEGATIVE mg/dL
Nitrite: NEGATIVE
PH: 5 (ref 5.0–8.0)
Protein, ur: NEGATIVE mg/dL
Specific Gravity, Urine: 1.044 — ABNORMAL HIGH (ref 1.005–1.030)

## 2016-11-08 LAB — TYPE AND SCREEN
ABO/RH(D): A POS
ANTIBODY SCREEN: NEGATIVE
UNIT DIVISION: 0
Unit division: 0

## 2016-11-08 LAB — RAPID URINE DRUG SCREEN, HOSP PERFORMED
Amphetamines: NOT DETECTED
BENZODIAZEPINES: NOT DETECTED
Barbiturates: NOT DETECTED
Cocaine: NOT DETECTED
Opiates: NOT DETECTED
Tetrahydrocannabinol: NOT DETECTED

## 2016-11-08 LAB — BPAM RBC
BLOOD PRODUCT EXPIRATION DATE: 201807022359
BLOOD PRODUCT EXPIRATION DATE: 201807032359
ISSUE DATE / TIME: 201806121752
ISSUE DATE / TIME: 201806121752
UNIT TYPE AND RH: 9500
Unit Type and Rh: 9500

## 2016-11-08 LAB — BASIC METABOLIC PANEL
Anion gap: 9 (ref 5–15)
BUN: 34 mg/dL — AB (ref 6–20)
CO2: 21 mmol/L — ABNORMAL LOW (ref 22–32)
Calcium: 8.9 mg/dL (ref 8.9–10.3)
Chloride: 106 mmol/L (ref 101–111)
Creatinine, Ser: 1.01 mg/dL — ABNORMAL HIGH (ref 0.44–1.00)
GFR calc Af Amer: 57 mL/min — ABNORMAL LOW (ref >60)
GFR, EST NON AFRICAN AMERICAN: 49 mL/min — AB (ref >60)
GLUCOSE: 137 mg/dL — AB (ref 65–99)
POTASSIUM: 3.6 mmol/L (ref 3.5–5.1)
Sodium: 136 mmol/L (ref 135–145)

## 2016-11-08 LAB — LACTIC ACID, PLASMA: Lactic Acid, Venous: 1.9 mmol/L (ref 0.5–1.9)

## 2016-11-08 LAB — CBC
HCT: 31.4 % — ABNORMAL LOW (ref 36.0–46.0)
Hemoglobin: 10.1 g/dL — ABNORMAL LOW (ref 12.0–15.0)
MCH: 30.1 pg (ref 26.0–34.0)
MCHC: 32.2 g/dL (ref 30.0–36.0)
MCV: 93.5 fL (ref 78.0–100.0)
Platelets: 259 10*3/uL (ref 150–400)
RBC: 3.36 MIL/uL — AB (ref 3.87–5.11)
RDW: 15.1 % (ref 11.5–15.5)
WBC: 15.7 10*3/uL — ABNORMAL HIGH (ref 4.0–10.5)

## 2016-11-08 LAB — TROPONIN I: Troponin I: 10.84 ng/mL (ref <0.03)

## 2016-11-08 LAB — BLOOD PRODUCT ORDER (VERBAL) VERIFICATION

## 2016-11-08 LAB — MRSA PCR SCREENING: MRSA by PCR: NEGATIVE

## 2016-11-08 MED ORDER — HYDROCODONE-ACETAMINOPHEN 5-325 MG PO TABS
1.0000 | ORAL_TABLET | ORAL | Status: DC | PRN
  Administered 2016-11-12: 1 via ORAL
  Filled 2016-11-08: qty 2
  Filled 2016-11-08: qty 1

## 2016-11-08 MED ORDER — DILTIAZEM LOAD VIA INFUSION
10.0000 mg | Freq: Once | INTRAVENOUS | Status: AC
  Administered 2016-11-08: 10 mg via INTRAVENOUS
  Filled 2016-11-08: qty 10

## 2016-11-08 MED ORDER — DEXTROSE 5 % IV SOLN
5.0000 mg/h | INTRAVENOUS | Status: DC
  Administered 2016-11-08: 5 mg/h via INTRAVENOUS
  Filled 2016-11-08: qty 100

## 2016-11-08 MED ORDER — AMIODARONE LOAD VIA INFUSION
150.0000 mg | Freq: Once | INTRAVENOUS | Status: AC
  Administered 2016-11-08: 150 mg via INTRAVENOUS
  Filled 2016-11-08: qty 83.34

## 2016-11-08 MED ORDER — ORAL CARE MOUTH RINSE: 15.0000 mL | Freq: Two times a day (BID) | OROMUCOSAL | Status: DC

## 2016-11-08 MED ORDER — MORPHINE SULFATE (PF) 4 MG/ML IV SOLN: 1.0000 mg | INTRAVENOUS | Status: DC | PRN

## 2016-11-08 MED ORDER — AMIODARONE LOAD VIA INFUSION
150.0000 mg | Freq: Once | INTRAVENOUS | Status: DC
  Filled 2016-11-08: qty 83.34

## 2016-11-08 MED ORDER — DULOXETINE HCL 20 MG PO CPEP
20.0000 mg | ORAL_CAPSULE | Freq: Every day | ORAL | Status: DC
  Administered 2016-11-08 – 2016-11-13 (×6): 20 mg via ORAL
  Filled 2016-11-08 (×6): qty 1

## 2016-11-08 MED ORDER — TRAZODONE HCL 50 MG PO TABS
25.0000 mg | ORAL_TABLET | Freq: Every day | ORAL | Status: DC
  Administered 2016-11-09 – 2016-11-16 (×8): 25 mg via ORAL
  Filled 2016-11-08 (×10): qty 1

## 2016-11-08 MED ORDER — AMIODARONE HCL IN DEXTROSE 360-4.14 MG/200ML-% IV SOLN: 60.0000 mg/h | INTRAVENOUS | Status: DC

## 2016-11-08 MED ORDER — SODIUM CHLORIDE 0.9 % IV BOLUS (SEPSIS): 500.0000 mL | Freq: Once | INTRAVENOUS | Status: DC

## 2016-11-08 MED ORDER — ORAL CARE MOUTH RINSE
15.0000 mL | Freq: Two times a day (BID) | OROMUCOSAL | Status: DC
  Administered 2016-11-09 (×2): 15 mL via OROMUCOSAL

## 2016-11-08 MED ORDER — ENSURE ENLIVE PO LIQD: 237.0000 mL | Freq: Two times a day (BID) | ORAL | Status: DC

## 2016-11-08 MED ORDER — AMIODARONE HCL IN DEXTROSE 360-4.14 MG/200ML-% IV SOLN: 30.0000 mg/h | INTRAVENOUS | Status: DC

## 2016-11-08 MED ORDER — AMIODARONE HCL IN DEXTROSE 360-4.14 MG/200ML-% IV SOLN
30.0000 mg/h | INTRAVENOUS | Status: DC
  Administered 2016-11-09: 30 mg/h via INTRAVENOUS
  Filled 2016-11-08: qty 200

## 2016-11-08 MED ORDER — AMIODARONE HCL IN DEXTROSE 360-4.14 MG/200ML-% IV SOLN
60.0000 mg/h | INTRAVENOUS | Status: AC
  Administered 2016-11-08 – 2016-11-09 (×2): 60 mg/h via INTRAVENOUS
  Filled 2016-11-08 (×2): qty 200

## 2016-11-08 NOTE — Progress Notes (Addendum)
Patient sat up at the edge of the bed with PT and HR shot into the 170's. HR sustaining in the 170's at this point. MD paged. Tillman Kazmierski, Rande Brunt, RN   EKG completed and MD at bedside. Awaiting new orders.
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
## 2016-11-08 NOTE — Progress Notes (Signed)
CRITICAL VALUE ALERT  Critical Value:  Troponin 10.84  Date & Time Notied:  11/08/16 1917  Provider Notified: Dr. Hulen Skains and Cardiology  Orders Received/Actions taken:
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
## 2016-11-08 NOTE — Progress Notes (Addendum)
Patient converted back into sinus rhythm with rates in the 90's at this time. Titrating Cardizem down. Pt states she feels she can breathe easier now. Pt resting comfortably at this time. Monitoring closely. Kalianne Fetting, Rande Brunt, RN   Cardiology PA left Amiodarone and NS bolus in the orderset if needed this evening. Will not need at this moment.
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
## 2016-11-08 NOTE — Progress Notes (Signed)
Pt had not voided since PTA; therefore, bladder scanned and resulted w/ 750mL. Encouraged pt to attempt to urinate via purewick device - patient stated that she couldn't, then attempted w/ bed pan and she also stated that she couldn't. Lastly attempted to get pt to void on own by getting up and pivoting to bedside commode - able to void successfully.  Post-void bladder scan resulted - 480mL.  Will continue to encourage pt to void on own and assess for discomfort r/t a full bladder, etc.   Guadalupe Maple, RN
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
## 2016-11-09 ENCOUNTER — Inpatient Hospital Stay (HOSPITAL_COMMUNITY): Payer: Medicare PPO

## 2016-11-09 DIAGNOSIS — I360 Nonrheumatic tricuspid (valve) stenosis: Secondary | ICD-10-CM

## 2016-11-09 DIAGNOSIS — R748 Abnormal levels of other serum enzymes: Secondary | ICD-10-CM

## 2016-11-09 LAB — BASIC METABOLIC PANEL
Anion gap: 7 (ref 5–15)
BUN: 35 mg/dL — AB (ref 6–20)
CALCIUM: 8.8 mg/dL — AB (ref 8.9–10.3)
CO2: 22 mmol/L (ref 22–32)
CREATININE: 0.87 mg/dL (ref 0.44–1.00)
Chloride: 106 mmol/L (ref 101–111)
GFR calc Af Amer: >60 mL/min (ref >60)
GFR, EST NON AFRICAN AMERICAN: 59 mL/min — AB (ref >60)
GLUCOSE: 133 mg/dL — AB (ref 65–99)
Potassium: 3.6 mmol/L (ref 3.5–5.1)
Sodium: 135 mmol/L (ref 135–145)

## 2016-11-09 LAB — TSH: TSH: 1.905 u[IU]/mL (ref 0.350–4.500)

## 2016-11-09 LAB — ECHOCARDIOGRAM COMPLETE
Height: 66 in
Weight: 1767.21 oz

## 2016-11-09 LAB — CBC WITH DIFFERENTIAL/PLATELET
BASOS ABS: 0 10*3/uL (ref 0.0–0.1)
Basophils Relative: 0 %
Eosinophils Absolute: 0.1 10*3/uL (ref 0.0–0.7)
Eosinophils Relative: 1 %
HCT: 30.2 % — ABNORMAL LOW (ref 36.0–46.0)
Hemoglobin: 9.6 g/dL — ABNORMAL LOW (ref 12.0–15.0)
Lymphocytes Relative: 13 %
Lymphs Abs: 1.9 10*3/uL (ref 0.7–4.0)
MCH: 29.5 pg (ref 26.0–34.0)
MCHC: 31.8 g/dL (ref 30.0–36.0)
MCV: 92.9 fL (ref 78.0–100.0)
MONO ABS: 1.2 10*3/uL — AB (ref 0.1–1.0)
MONOS PCT: 8 %
Neutro Abs: 11.1 10*3/uL — ABNORMAL HIGH (ref 1.7–7.7)
Neutrophils Relative %: 78 %
PLATELETS: 272 10*3/uL (ref 150–400)
RBC: 3.25 MIL/uL — ABNORMAL LOW (ref 3.87–5.11)
RDW: 15.2 % (ref 11.5–15.5)
WBC: 14.3 10*3/uL — ABNORMAL HIGH (ref 4.0–10.5)

## 2016-11-09 LAB — TROPONIN I
TROPONIN I: 9.23 ng/mL — AB (ref <0.03)
TROPONIN I: 8.02 ng/mL — AB (ref <0.03)

## 2016-11-09 LAB — MAGNESIUM: MAGNESIUM: 1.8 mg/dL (ref 1.7–2.4)

## 2016-11-09 MED ORDER — METOPROLOL TARTRATE 5 MG/5ML IV SOLN
5.0000 mg | Freq: Once | INTRAVENOUS | Status: AC
  Administered 2016-11-09: 5 mg via INTRAVENOUS
  Filled 2016-11-09: qty 5

## 2016-11-09 MED ORDER — POLYETHYLENE GLYCOL 3350 17 G PO PACK
17.0000 g | PACK | Freq: Every day | ORAL | Status: DC
  Administered 2016-11-09 – 2016-11-16 (×4): 17 g via ORAL
  Filled 2016-11-09 (×9): qty 1

## 2016-11-09 MED ORDER — METOPROLOL TARTRATE 25 MG PO TABS
25.0000 mg | ORAL_TABLET | Freq: Four times a day (QID) | ORAL | Status: DC
Start: 2016-11-10 — End: 2016-11-17
  Administered 2016-11-09 – 2016-11-17 (×24): 25 mg via ORAL
  Filled 2016-11-09 (×23): qty 1

## 2016-11-09 MED ORDER — DOCUSATE SODIUM 100 MG PO CAPS
100.0000 mg | ORAL_CAPSULE | Freq: Two times a day (BID) | ORAL | Status: DC
  Administered 2016-11-09 – 2016-11-17 (×6): 100 mg via ORAL
  Filled 2016-11-09 (×14): qty 1
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
## 2016-11-09 NOTE — Progress Notes (Signed)
Stopped by to check on pt, who was asleep. Consulted w/ nurse as to pt's status. Chaplain available for f/u.   11/09/16 1500  Clinical Encounter Type  Visited With Patient not available;Health care provider  Visit Type Follow-up  Referral From [place] Derry Kassel, Chaplain
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
## 2016-11-09 NOTE — Progress Notes (Signed)
Pt verbalized auditory and visual hallucinations to the RN.  RN encouraged pt to express these occurences in detain and elaborate when ever possible.  RN will continue to monitor.   Sherril Cong, RN

## 2016-11-09 NOTE — Progress Notes (Signed)
Stopped by to visit w/ pt, but she was being ministered to medically. Consulted w/ nurse and dietician as to pt's status and needs, and when family likely to be present. Chaplain available for f/u.    11/09/16 1100  Clinical Encounter Type  Visited With Patient not available;Health care provider  Visit Type Follow-up;Psychological support;Social support;Critical Care  Referral From Chaplain  Spiritual Encounters  Spiritual Needs Emotional  Stress Factors  Patient Stress Factors Health changes;Loss of control  Family Stress Factors Family relationships;Loss of control   Gerrit Heck, Chaplain
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
## 2016-11-09 NOTE — Progress Notes (Signed)
  Echocardiogram 2D Echocardiogram has been performed.  Bobbye [place] 11/09/2016, 11:07 AM
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
## 2016-11-09 NOTE — Progress Notes (Signed)
Tech gave all bath supplies to sitter; to assist with PT with bath.
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
## 2016-11-09 NOTE — Clinical Social Work Note (Signed)
Psych recommending geriatric psych placement when medically stable. CSW sent referral to [facility] [place] at this time.   Stacy Mcguire, [place]

## 2016-11-10 ENCOUNTER — Inpatient Hospital Stay (HOSPITAL_COMMUNITY): Payer: Medicare PPO

## 2016-11-10 LAB — BASIC METABOLIC PANEL
Anion gap: 6 (ref 5–15)
BUN: 22 mg/dL — ABNORMAL HIGH (ref 6–20)
CALCIUM: 8.6 mg/dL — AB (ref 8.9–10.3)
CO2: 23 mmol/L (ref 22–32)
Chloride: 103 mmol/L (ref 101–111)
Creatinine, Ser: 0.75 mg/dL (ref 0.44–1.00)
GFR calc Af Amer: >60 mL/min (ref >60)
GFR calc non Af Amer: >60 mL/min (ref >60)
GLUCOSE: 116 mg/dL — AB (ref 65–99)
Potassium: 3.7 mmol/L (ref 3.5–5.1)
Sodium: 132 mmol/L — ABNORMAL LOW (ref 135–145)

## 2016-11-10 LAB — CBC
HEMATOCRIT: 27.8 % — AB (ref 36.0–46.0)
Hemoglobin: 9 g/dL — ABNORMAL LOW (ref 12.0–15.0)
MCH: 29.9 pg (ref 26.0–34.0)
MCHC: 32.4 g/dL (ref 30.0–36.0)
MCV: 92.4 fL (ref 78.0–100.0)
Platelets: 276 10*3/uL (ref 150–400)
RBC: 3.01 MIL/uL — ABNORMAL LOW (ref 3.87–5.11)
RDW: 14.8 % (ref 11.5–15.5)
WBC: 14.1 10*3/uL — ABNORMAL HIGH (ref 4.0–10.5)

## 2016-11-10 MED ORDER — WHITE PETROLATUM GEL
Status: AC
  Administered 2016-11-10: 08:00:00
  Filled 2016-11-10: qty 1

## 2016-11-10 MED ORDER — WHITE PETROLATUM GEL: Status: DC | PRN
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
## 2016-11-11 LAB — CBC WITH DIFFERENTIAL/PLATELET
Basophils Absolute: 0 10*3/uL (ref 0.0–0.1)
Basophils Relative: 0 %
EOS ABS: 0.2 10*3/uL (ref 0.0–0.7)
Eosinophils Relative: 2 %
HEMATOCRIT: 31 % — AB (ref 36.0–46.0)
HEMOGLOBIN: 9.9 g/dL — AB (ref 12.0–15.0)
LYMPHS ABS: 1.8 10*3/uL (ref 0.7–4.0)
LYMPHS PCT: 15 %
MCH: 29.6 pg (ref 26.0–34.0)
MCHC: 31.9 g/dL (ref 30.0–36.0)
MCV: 92.5 fL (ref 78.0–100.0)
Monocytes Absolute: 1 10*3/uL (ref 0.1–1.0)
Monocytes Relative: 9 %
NEUTROS ABS: 8.7 10*3/uL — AB (ref 1.7–7.7)
NEUTROS PCT: 74 %
Platelets: 333 10*3/uL (ref 150–400)
RBC: 3.35 MIL/uL — AB (ref 3.87–5.11)
RDW: 14.7 % (ref 11.5–15.5)
WBC: 11.7 10*3/uL — ABNORMAL HIGH (ref 4.0–10.5)

## 2016-11-11 LAB — BASIC METABOLIC PANEL
Anion gap: 8 (ref 5–15)
BUN: 19 mg/dL (ref 6–20)
CHLORIDE: 99 mmol/L — AB (ref 101–111)
CO2: 22 mmol/L (ref 22–32)
Calcium: 8.6 mg/dL — ABNORMAL LOW (ref 8.9–10.3)
Creatinine, Ser: 0.87 mg/dL (ref 0.44–1.00)
GFR calc Af Amer: >60 mL/min (ref >60)
GFR calc non Af Amer: 59 mL/min — ABNORMAL LOW (ref >60)
Glucose, Bld: 124 mg/dL — ABNORMAL HIGH (ref 65–99)
Potassium: 3.5 mmol/L (ref 3.5–5.1)
SODIUM: 129 mmol/L — AB (ref 135–145)

## 2016-11-11 MED ORDER — ALUM & MAG HYDROXIDE-SIMETH 200-200-20 MG/5ML PO SUSP
30.0000 mL | Freq: Four times a day (QID) | ORAL | Status: DC | PRN
Start: 2016-11-11 — End: 2016-11-17
  Administered 2016-11-11 – 2016-11-17 (×3): 30 mL via ORAL
  Filled 2016-11-11 (×3): qty 30

## 2016-11-11 MED ORDER — FAMOTIDINE 40 MG/5ML PO SUSR
20.0000 mg | Freq: Once | ORAL | Status: AC
  Administered 2016-11-11: 20 mg via ORAL
  Filled 2016-11-11: qty 2.5
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
## 2016-11-12 LAB — BASIC METABOLIC PANEL
ANION GAP: 6 (ref 5–15)
BUN: 19 mg/dL (ref 6–20)
CO2: 24 mmol/L (ref 22–32)
CREATININE: 0.93 mg/dL (ref 0.44–1.00)
Calcium: 8.4 mg/dL — ABNORMAL LOW (ref 8.9–10.3)
Chloride: 102 mmol/L (ref 101–111)
GFR calc Af Amer: >60 mL/min (ref >60)
GFR, EST NON AFRICAN AMERICAN: 54 mL/min — AB (ref >60)
Glucose, Bld: 108 mg/dL — ABNORMAL HIGH (ref 65–99)
Potassium: 4.1 mmol/L (ref 3.5–5.1)
Sodium: 132 mmol/L — ABNORMAL LOW (ref 135–145)
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
## 2016-11-13 DIAGNOSIS — F332 Major depressive disorder, recurrent severe without psychotic features: Secondary | ICD-10-CM

## 2016-11-13 DIAGNOSIS — X749XXA Intentional self-harm by unspecified firearm discharge, initial encounter: Secondary | ICD-10-CM

## 2016-11-13 DIAGNOSIS — G4700 Insomnia, unspecified: Secondary | ICD-10-CM

## 2016-11-13 DIAGNOSIS — T1491XA Suicide attempt, initial encounter: Secondary | ICD-10-CM

## 2016-11-13 MED ORDER — DULOXETINE HCL 30 MG PO CPEP
30.0000 mg | ORAL_CAPSULE | Freq: Every day | ORAL | Status: DC
  Administered 2016-11-14 – 2016-11-17 (×4): 30 mg via ORAL
  Filled 2016-11-13 (×6): qty 1

## 2016-11-13 MED ORDER — ENOXAPARIN SODIUM 40 MG/0.4ML ~~LOC~~ SOLN
40.0000 mg | Freq: Every day | SUBCUTANEOUS | Status: DC
  Administered 2016-11-13: 40 mg via SUBCUTANEOUS
  Filled 2016-11-13 (×5): qty 0.4
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
## 2016-11-13 NOTE — Clinical Social Work Note (Signed)
CSW spoke with pt at bedside to explained placement for geri psych. Pt agreeable. Pt not concerned about location. CSW will continue to follow up with pt as plan is determined.   [place], [place]

## 2016-11-13 NOTE — Care Management Important Message (Signed)
Important Message  Patient Details  Name: Stacy Mcguire MRN: [id number] Date of Birth: 09-Jun-1927   Medicare Important Message Given:  Yes    Maxene Byington Montine Circle 11/13/2016, 1:41 PM

## 2016-11-13 NOTE — Progress Notes (Signed)
During assessment found a plastic sharp toothpick inside the patient's sock. She was not happy that I found it. When I asked if she needed anything she said "yes a toothpick." I stated she can't have that in her room at this point. Will continue to monitor patient.
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
## 2016-11-14 LAB — BASIC METABOLIC PANEL
Anion gap: 8 (ref 5–15)
BUN: 19 mg/dL (ref 6–20)
CHLORIDE: 102 mmol/L (ref 101–111)
CO2: 23 mmol/L (ref 22–32)
CREATININE: 0.88 mg/dL (ref 0.44–1.00)
Calcium: 8.5 mg/dL — ABNORMAL LOW (ref 8.9–10.3)
GFR calc Af Amer: >60 mL/min (ref >60)
GFR calc non Af Amer: 58 mL/min — ABNORMAL LOW (ref >60)
Glucose, Bld: 103 mg/dL — ABNORMAL HIGH (ref 65–99)
POTASSIUM: 4.2 mmol/L (ref 3.5–5.1)
SODIUM: 133 mmol/L — AB (ref 135–145)

## 2016-11-14 LAB — CBC
HEMATOCRIT: 29.3 % — AB (ref 36.0–46.0)
Hemoglobin: 9.3 g/dL — ABNORMAL LOW (ref 12.0–15.0)
MCH: 29.9 pg (ref 26.0–34.0)
MCHC: 31.7 g/dL (ref 30.0–36.0)
MCV: 94.2 fL (ref 78.0–100.0)
Platelets: 397 10*3/uL (ref 150–400)
RBC: 3.11 MIL/uL — ABNORMAL LOW (ref 3.87–5.11)
RDW: 15.5 % (ref 11.5–15.5)
WBC: 9.2 10*3/uL (ref 4.0–10.5)
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
## 2016-11-15 LAB — BASIC METABOLIC PANEL
Anion gap: 5 (ref 5–15)
BUN: 18 mg/dL (ref 6–20)
CALCIUM: 8.6 mg/dL — AB (ref 8.9–10.3)
CO2: 25 mmol/L (ref 22–32)
CREATININE: 0.98 mg/dL (ref 0.44–1.00)
Chloride: 99 mmol/L — ABNORMAL LOW (ref 101–111)
GFR calc Af Amer: 59 mL/min — ABNORMAL LOW (ref >60)
GFR calc non Af Amer: 51 mL/min — ABNORMAL LOW (ref >60)
Glucose, Bld: 159 mg/dL — ABNORMAL HIGH (ref 65–99)
POTASSIUM: 4 mmol/L (ref 3.5–5.1)
Sodium: 129 mmol/L — ABNORMAL LOW (ref 135–145)

## 2016-11-15 MED ORDER — PRO-STAT SUGAR FREE PO LIQD
30.0000 mL | Freq: Two times a day (BID) | ORAL | Status: DC
  Administered 2016-11-16 – 2016-11-17 (×2): 30 mL via ORAL
  Filled 2016-11-15 (×3): qty 30

## 2016-11-15 MED ORDER — BOOST / RESOURCE BREEZE PO LIQD
1.0000 | Freq: Two times a day (BID) | ORAL | Status: DC
  Administered 2016-11-15 – 2016-11-17 (×3): 1 via ORAL

## 2016-11-15 NOTE — Clinical Social Work Note (Signed)
Followed up with [facility] and they do have beds. Denyse Amass in admissions will take a look at the referral.   Loletha Grayer, [place]
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
## 2016-11-15 NOTE — Clinical Social Work Note (Addendum)
Call made to [facility] [phone]) regarding possible bed availability for patient. Spoke with Ed with Intake and they have a patient in the ED who needs a bed and appears to meet their criteria, however he will call ED SW Jody, if they do have bed availability this evening. Ed was given Ed social worker's phone number. Call made to [facility], ED CSW and information provided.  Anyla Israelson Givens, MSW, LCSW Licensed Clinical Social Worker [place] [phone]
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
## 2016-11-16 MED ORDER — DULOXETINE HCL 30 MG PO CPEP: 30.0000 mg | ORAL_CAPSULE | Freq: Every day | ORAL | 3 refills | Status: DC

## 2016-11-16 MED ORDER — BOOST / RESOURCE BREEZE PO LIQD: 1.0000 | Freq: Two times a day (BID) | ORAL | 0 refills | Status: DC

## 2016-11-16 MED ORDER — METOPROLOL TARTRATE 25 MG PO TABS: 25.0000 mg | ORAL_TABLET | Freq: Four times a day (QID) | ORAL | Status: DC

## 2016-11-16 MED ORDER — SODIUM CHLORIDE 1 G PO TABS
1.0000 g | ORAL_TABLET | Freq: Once | ORAL | Status: AC
  Administered 2016-11-16: 1 g via ORAL
  Filled 2016-11-16 (×3): qty 1

## 2016-11-16 MED ORDER — SODIUM CHLORIDE 1 G PO TABS: 1.0000 g | ORAL_TABLET | Freq: Three times a day (TID) | ORAL | Status: DC

## 2016-11-16 MED ORDER — ACETAMINOPHEN 325 MG PO TABS
650.0000 mg | ORAL_TABLET | Freq: Three times a day (TID) | ORAL | Status: DC | PRN
Start: 2016-11-16 — End: 2017-10-02

## 2016-11-16 MED ORDER — PRO-STAT SUGAR FREE PO LIQD: 30.0000 mL | Freq: Two times a day (BID) | ORAL | 0 refills | Status: DC

## 2016-11-16 MED ORDER — TRAZODONE HCL 50 MG PO TABS: 25.0000 mg | ORAL_TABLET | Freq: Every day | ORAL | Status: DC

## 2016-11-16 MED ORDER — POLYETHYLENE GLYCOL 3350 17 G PO PACK: 17.0000 g | PACK | Freq: Every day | ORAL | 0 refills | Status: DC | PRN

## 2016-11-16 MED ORDER — WHITE PETROLATUM GEL
Status: AC
  Filled 2016-11-16: qty 1
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
## 2016-11-16 NOTE — Clinical Social Work Note (Addendum)
Pt has a bed today at [facility]. Pt agreed to go. Pt agreed to sign herself in. Rosana Hoes asking that CSW call at 2:30 to confirm bed is ready. Accepting attending will be Risado. Number for report: [phone]. Son notified. MD notifed. RNCM notified.   [place], [place]

## 2016-11-16 NOTE — Clinical Social Work Note (Signed)
CSW advised by CSW Shelton Silvas to follow-up with [facility] at 2:30 pm to confirm bed is ready. CSW called [phone]) at 2:32 pm and spoke with Lyndee Leo regarding patient (requested to speak with Denyse Amass). Was referred to Intake and no one answered the phone. Called again at 2:47 and message left, and called again 4:31 pm and no one answered the phone.  CSW Shelton Silvas will be updated on Friday regarding patient.  Deasha Clendenin Givens, MSW, LCSW Licensed Clinical Social Worker [place] [phone]

## 2016-11-17 ENCOUNTER — Encounter (HOSPITAL_COMMUNITY): Payer: Self-pay | Admitting: *Deleted

## 2016-11-17 LAB — BASIC METABOLIC PANEL
ANION GAP: 7 (ref 5–15)
BUN: 21 mg/dL — ABNORMAL HIGH (ref 6–20)
CO2: 23 mmol/L (ref 22–32)
Calcium: 8.6 mg/dL — ABNORMAL LOW (ref 8.9–10.3)
Chloride: 103 mmol/L (ref 101–111)
Creatinine, Ser: 0.8 mg/dL (ref 0.44–1.00)
GFR calc non Af Amer: >60 mL/min (ref >60)
Glucose, Bld: 111 mg/dL — ABNORMAL HIGH (ref 65–99)
POTASSIUM: 4.1 mmol/L (ref 3.5–5.1)
Sodium: 133 mmol/L — ABNORMAL LOW (ref 135–145)

## 2016-11-17 MED ORDER — SODIUM CHLORIDE 1 G PO TABS: 1.0000 g | ORAL_TABLET | Freq: Three times a day (TID) | ORAL | Status: DC

## 2016-11-17 MED ORDER — SODIUM CHLORIDE 1 G PO TABS
1.0000 g | ORAL_TABLET | Freq: Three times a day (TID) | ORAL | Status: DC
  Filled 2016-11-17 (×2): qty 1
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
## 2016-11-17 NOTE — Clinical Social Work Note (Signed)
Clinical Social Worker facilitated patient discharge including contacting patient family and facility to confirm patient discharge plans.  Clinical information faxed to facility and family agreeable with plan.  CSW arranged ambulance transport via [facility] to [place] .  RN to call for report prior to discharge.  Clinical Social Worker will sign off for now as social work intervention is no longer needed. Please consult [place] again if new need arises.  [place], [place]

## 2016-11-17 NOTE — Clinical Social Work Note (Addendum)
Confirmed with [facility]--Pt has a bed today. Accepting attending will be [place]. Number for report: RN call report to [phone]. Son notified. MD notifed. RNCM notified.   [place], [place]

## 2016-11-17 NOTE — Progress Notes (Signed)
Pt stated that she wanted to get back in bed. Tech/Sitter attempted to encourage Pt sit up a little long. Explained to Pt that laying in the bed will only make her weaker. Pt acknowledged and stated she would get back up later. Tech attempted to encourage Pt to pull the reclincer's release handle on chair. Pt stated later therefore tech/sitter pulled released handle and  assisted Pt out of recliner.

## 2016-11-17 NOTE — Progress Notes (Signed)
Patient discharged to [facility] as ordered, all belongings sent with patient and transporter, reported to nurse [facility].

## 2016-12-11 ENCOUNTER — Emergency Department (HOSPITAL_COMMUNITY): Payer: Medicare PPO

## 2016-12-11 ENCOUNTER — Encounter (HOSPITAL_COMMUNITY): Payer: Self-pay

## 2016-12-11 ENCOUNTER — Emergency Department (HOSPITAL_COMMUNITY)
Admission: EM | Admit: 2016-12-11 | Discharge: 2016-12-12 | Disposition: A | Discharge status: Home / Self Care | Payer: Medicare PPO | Attending: Emergency Medicine | Admitting: Emergency Medicine

## 2016-12-11 DIAGNOSIS — I129 Hypertensive chronic kidney disease with stage 1 through stage 4 chronic kidney disease, or unspecified chronic kidney disease: Secondary | ICD-10-CM | POA: Diagnosis not present

## 2016-12-11 DIAGNOSIS — F4329 Adjustment disorder with other symptoms: Secondary | ICD-10-CM | POA: Diagnosis not present

## 2016-12-11 DIAGNOSIS — N183 Chronic kidney disease, stage 3 (moderate): Secondary | ICD-10-CM | POA: Diagnosis not present

## 2016-12-11 DIAGNOSIS — Z79899 Other long term (current) drug therapy: Secondary | ICD-10-CM | POA: Diagnosis not present

## 2016-12-11 DIAGNOSIS — R45851 Suicidal ideations: Secondary | ICD-10-CM

## 2016-12-11 DIAGNOSIS — Z008 Encounter for other general examination: Secondary | ICD-10-CM

## 2016-12-11 DIAGNOSIS — I4891 Unspecified atrial fibrillation: Secondary | ICD-10-CM | POA: Diagnosis not present

## 2016-12-11 DIAGNOSIS — Z9114 Patient's other noncompliance with medication regimen: Secondary | ICD-10-CM | POA: Insufficient documentation

## 2016-12-11 DIAGNOSIS — Z046 Encounter for general psychiatric examination, requested by authority: Secondary | ICD-10-CM | POA: Insufficient documentation

## 2016-12-11 DIAGNOSIS — R110 Nausea: Secondary | ICD-10-CM | POA: Diagnosis not present

## 2016-12-11 DIAGNOSIS — R630 Anorexia: Secondary | ICD-10-CM | POA: Diagnosis present

## 2016-12-11 DIAGNOSIS — R109 Unspecified abdominal pain: Secondary | ICD-10-CM | POA: Diagnosis not present

## 2016-12-11 DIAGNOSIS — J942 Hemothorax: Secondary | ICD-10-CM | POA: Diagnosis not present

## 2016-12-11 LAB — URINALYSIS, ROUTINE W REFLEX MICROSCOPIC
Bilirubin Urine: NEGATIVE
Glucose, UA: NEGATIVE mg/dL
Hgb urine dipstick: NEGATIVE
Ketones, ur: 5 mg/dL — AB
LEUKOCYTES UA: NEGATIVE
Nitrite: NEGATIVE
PH: 7 (ref 5.0–8.0)
Protein, ur: NEGATIVE mg/dL
Specific Gravity, Urine: 1.006 (ref 1.005–1.030)

## 2016-12-11 LAB — CBC
HEMATOCRIT: 36 % (ref 36.0–46.0)
Hemoglobin: 12.2 g/dL (ref 12.0–15.0)
MCH: 30.9 pg (ref 26.0–34.0)
MCHC: 33.9 g/dL (ref 30.0–36.0)
MCV: 91.1 fL (ref 78.0–100.0)
Platelets: 448 10*3/uL — ABNORMAL HIGH (ref 150–400)
RBC: 3.95 MIL/uL (ref 3.87–5.11)
RDW: 15.2 % (ref 11.5–15.5)
WBC: 13.4 10*3/uL — AB (ref 4.0–10.5)

## 2016-12-11 LAB — SALICYLATE LEVEL: Salicylate Lvl: <7 mg/dL (ref 2.8–30.0)

## 2016-12-11 LAB — COMPREHENSIVE METABOLIC PANEL
ALBUMIN: 4.1 g/dL (ref 3.5–5.0)
ALT: 19 U/L (ref 14–54)
AST: 24 U/L (ref 15–41)
Alkaline Phosphatase: 63 U/L (ref 38–126)
Anion gap: 11 (ref 5–15)
BUN: 19 mg/dL (ref 6–20)
CO2: 23 mmol/L (ref 22–32)
Calcium: 9.7 mg/dL (ref 8.9–10.3)
Chloride: 100 mmol/L — ABNORMAL LOW (ref 101–111)
Creatinine, Ser: 0.85 mg/dL (ref 0.44–1.00)
GFR calc Af Amer: >60 mL/min (ref >60)
GFR calc non Af Amer: >60 mL/min (ref >60)
Glucose, Bld: 129 mg/dL — ABNORMAL HIGH (ref 65–99)
POTASSIUM: 3.6 mmol/L (ref 3.5–5.1)
SODIUM: 134 mmol/L — AB (ref 135–145)
Total Bilirubin: 0.7 mg/dL (ref 0.3–1.2)
Total Protein: 7.1 g/dL (ref 6.5–8.1)

## 2016-12-11 LAB — RAPID URINE DRUG SCREEN, HOSP PERFORMED
Amphetamines: NOT DETECTED
Barbiturates: NOT DETECTED
Benzodiazepines: NOT DETECTED
Cocaine: NOT DETECTED
OPIATES: NOT DETECTED
Tetrahydrocannabinol: NOT DETECTED

## 2016-12-11 LAB — ETHANOL: Alcohol, Ethyl (B): <5 mg/dL (ref <5)

## 2016-12-11 LAB — I-STAT TROPONIN, ED: Troponin i, poc: 0.05 ng/mL (ref 0.00–0.08)

## 2016-12-11 LAB — ACETAMINOPHEN LEVEL

## 2016-12-11 LAB — LIPASE, BLOOD: LIPASE: 30 U/L (ref 11–51)

## 2016-12-11 MED ORDER — METOPROLOL TARTRATE 25 MG PO TABS
25.0000 mg | ORAL_TABLET | Freq: Three times a day (TID) | ORAL | Status: DC
  Administered 2016-12-12: 25 mg via ORAL
  Filled 2016-12-11: qty 1

## 2016-12-11 MED ORDER — ONDANSETRON HCL 4 MG PO TABS: 4.0000 mg | ORAL_TABLET | Freq: Three times a day (TID) | ORAL | Status: DC | PRN

## 2016-12-11 MED ORDER — DULOXETINE HCL 30 MG PO CPEP
30.0000 mg | ORAL_CAPSULE | Freq: Two times a day (BID) | ORAL | Status: DC
  Administered 2016-12-12 (×2): 30 mg via ORAL
  Filled 2016-12-11 (×2): qty 1

## 2016-12-11 MED ORDER — SODIUM CHLORIDE 0.9 % IV BOLUS (SEPSIS)
1000.0000 mL | Freq: Once | INTRAVENOUS | Status: AC
  Administered 2016-12-11: 1000 mL via INTRAVENOUS

## 2016-12-11 MED ORDER — TRAZODONE HCL 50 MG PO TABS
50.0000 mg | ORAL_TABLET | Freq: Every evening | ORAL | Status: DC
  Administered 2016-12-12: 50 mg via ORAL
  Filled 2016-12-11: qty 1

## 2016-12-11 MED ORDER — METOPROLOL TARTRATE 25 MG PO TABS
25.0000 mg | ORAL_TABLET | Freq: Once | ORAL | Status: AC
  Administered 2016-12-11: 25 mg via ORAL
  Filled 2016-12-11: qty 1

## 2016-12-11 MED ORDER — MESALAMINE 1.2 G PO TBEC
4.8000 g | DELAYED_RELEASE_TABLET | Freq: Every day | ORAL | Status: DC
  Administered 2016-12-12: 4.8 g via ORAL
  Filled 2016-12-11: qty 4

## 2016-12-11 NOTE — ED Notes (Addendum)
TTS remote monitor at bedside. Patient speaking to TTS staff at this time.
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
## 2016-12-11 NOTE — ED Notes (Signed)
Report given to [facility] nurse.

## 2016-12-11 NOTE — ED Notes (Signed)
Laboratory states that the urine sample provided by the patient was not enough for testing. Requests new sample.
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
## 2016-12-12 DIAGNOSIS — I4891 Unspecified atrial fibrillation: Secondary | ICD-10-CM

## 2016-12-12 DIAGNOSIS — J942 Hemothorax: Secondary | ICD-10-CM

## 2016-12-12 DIAGNOSIS — F4329 Adjustment disorder with other symptoms: Secondary | ICD-10-CM | POA: Diagnosis not present
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
## 2016-12-12 NOTE — Discharge Instructions (Signed)
For your behavioral health needs, you are advised to follow up with Norma Fredrickson, MD at [facility].  Call them at your earliest opportunity to ask about scheduling an intake appointment:       [facility]      [address]      [place], [place] [address]      [phone]
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
## 2016-12-12 NOTE — BH Assessment (Signed)
[facility] Assessment Progress Note  Per Corena Pilgrim, MD, this pt does not require psychiatric hospitalization at this time.  Pt is to be discharged from [facility] with recommendation to follow up with Norma Fredrickson, MD at [facility].  This has been included in pt's discharge instructions.  Pt's nurse has been notified.  Jalene Mullet, [place] Triage Specialist [phone]

## 2016-12-12 NOTE — Progress Notes (Signed)
CSW contacted patients daughter-n-law, Laretta Alstrom [phone], regarding discharge plans. Patient is currently medically/ psychiatrically cleared at this time. Daughter expressed her concerns with patient discharging and having previous inpatient admissions. Daughter-n-law stated she would pick patient up around 12PM today.   Kingsley Spittle, [facility] Emergency Room Clinical Social Worker [phone]

## 2017-02-01 ENCOUNTER — Emergency Department (HOSPITAL_COMMUNITY)
Admission: EM | Admit: 2017-02-01 | Discharge: 2017-02-01 | Disposition: A | Discharge status: Home / Self Care | Payer: Medicare PPO | Attending: Emergency Medicine | Admitting: Emergency Medicine

## 2017-02-01 ENCOUNTER — Encounter (HOSPITAL_COMMUNITY): Payer: Self-pay | Admitting: Emergency Medicine

## 2017-02-01 DIAGNOSIS — R531 Weakness: Secondary | ICD-10-CM | POA: Diagnosis present

## 2017-02-01 DIAGNOSIS — Z5321 Procedure and treatment not carried out due to patient leaving prior to being seen by health care provider: Secondary | ICD-10-CM | POA: Insufficient documentation

## 2017-02-01 HISTORY — DX: Adult failure to thrive: R62.7

## 2017-02-01 HISTORY — DX: Anorexia: R63.0

## 2017-02-01 NOTE — ED Triage Notes (Signed)
Family reports pt is feeling weak. Has spent the last few days in bed, and needs assistance getting to bathroom. Pt also reports nausea and disorientation. Pt told family she is having a UC flare up. Family also wants to speak to a Education officer, museum regarding assistive living.

## 2017-02-17 ENCOUNTER — Inpatient Hospital Stay (HOSPITAL_COMMUNITY)

## 2017-02-17 ENCOUNTER — Encounter (HOSPITAL_COMMUNITY): Payer: Self-pay

## 2017-02-17 ENCOUNTER — Emergency Department (HOSPITAL_COMMUNITY)

## 2017-02-17 ENCOUNTER — Inpatient Hospital Stay (HOSPITAL_COMMUNITY)
Admission: EM | Admit: 2017-02-17 | Discharge: 2017-02-24 | DRG: 469 | Disposition: A | Discharge status: Skilled Nursing Facility | Attending: Nephrology | Admitting: Nephrology

## 2017-02-17 DIAGNOSIS — K219 Gastro-esophageal reflux disease without esophagitis: Secondary | ICD-10-CM | POA: Diagnosis present

## 2017-02-17 DIAGNOSIS — S72012A Unspecified intracapsular fracture of left femur, initial encounter for closed fracture: Secondary | ICD-10-CM | POA: Diagnosis present

## 2017-02-17 DIAGNOSIS — I5041 Acute combined systolic (congestive) and diastolic (congestive) heart failure: Secondary | ICD-10-CM

## 2017-02-17 DIAGNOSIS — Z881 Allergy status to other antibiotic agents status: Secondary | ICD-10-CM

## 2017-02-17 DIAGNOSIS — W19XXXA Unspecified fall, initial encounter: Secondary | ICD-10-CM

## 2017-02-17 DIAGNOSIS — N179 Acute kidney failure, unspecified: Secondary | ICD-10-CM | POA: Diagnosis present

## 2017-02-17 DIAGNOSIS — R1310 Dysphagia, unspecified: Secondary | ICD-10-CM | POA: Diagnosis present

## 2017-02-17 DIAGNOSIS — E43 Unspecified severe protein-calorie malnutrition: Secondary | ICD-10-CM | POA: Diagnosis present

## 2017-02-17 DIAGNOSIS — R000 Tachycardia, unspecified: Secondary | ICD-10-CM | POA: Diagnosis present

## 2017-02-17 DIAGNOSIS — I5043 Acute on chronic combined systolic (congestive) and diastolic (congestive) heart failure: Secondary | ICD-10-CM | POA: Diagnosis present

## 2017-02-17 DIAGNOSIS — Z90710 Acquired absence of both cervix and uterus: Secondary | ICD-10-CM

## 2017-02-17 DIAGNOSIS — W1830XA Fall on same level, unspecified, initial encounter: Secondary | ICD-10-CM | POA: Diagnosis present

## 2017-02-17 DIAGNOSIS — R296 Repeated falls: Secondary | ICD-10-CM | POA: Diagnosis present

## 2017-02-17 DIAGNOSIS — R778 Other specified abnormalities of plasma proteins: Secondary | ICD-10-CM

## 2017-02-17 DIAGNOSIS — K5190 Ulcerative colitis, unspecified, without complications: Secondary | ICD-10-CM | POA: Diagnosis present

## 2017-02-17 DIAGNOSIS — N183 Chronic kidney disease, stage 3 (moderate): Secondary | ICD-10-CM | POA: Diagnosis present

## 2017-02-17 DIAGNOSIS — R748 Abnormal levels of other serum enzymes: Secondary | ICD-10-CM | POA: Diagnosis not present

## 2017-02-17 DIAGNOSIS — S72002D Fracture of unspecified part of neck of left femur, subsequent encounter for closed fracture with routine healing: Secondary | ICD-10-CM | POA: Diagnosis not present

## 2017-02-17 DIAGNOSIS — S72002A Fracture of unspecified part of neck of left femur, initial encounter for closed fracture: Secondary | ICD-10-CM | POA: Diagnosis not present

## 2017-02-17 DIAGNOSIS — Z9119 Patient's noncompliance with other medical treatment and regimen: Secondary | ICD-10-CM

## 2017-02-17 DIAGNOSIS — R52 Pain, unspecified: Secondary | ICD-10-CM

## 2017-02-17 DIAGNOSIS — Z915 Personal history of self-harm: Secondary | ICD-10-CM

## 2017-02-17 DIAGNOSIS — R45851 Suicidal ideations: Secondary | ICD-10-CM | POA: Diagnosis present

## 2017-02-17 DIAGNOSIS — I130 Hypertensive heart and chronic kidney disease with heart failure and stage 1 through stage 4 chronic kidney disease, or unspecified chronic kidney disease: Secondary | ICD-10-CM | POA: Diagnosis present

## 2017-02-17 DIAGNOSIS — E876 Hypokalemia: Secondary | ICD-10-CM | POA: Diagnosis present

## 2017-02-17 DIAGNOSIS — Z66 Do not resuscitate: Secondary | ICD-10-CM | POA: Diagnosis present

## 2017-02-17 DIAGNOSIS — R627 Adult failure to thrive: Secondary | ICD-10-CM | POA: Diagnosis present

## 2017-02-17 DIAGNOSIS — F332 Major depressive disorder, recurrent severe without psychotic features: Secondary | ICD-10-CM | POA: Diagnosis present

## 2017-02-17 DIAGNOSIS — Z8249 Family history of ischemic heart disease and other diseases of the circulatory system: Secondary | ICD-10-CM

## 2017-02-17 DIAGNOSIS — S72009A Fracture of unspecified part of neck of unspecified femur, initial encounter for closed fracture: Secondary | ICD-10-CM

## 2017-02-17 DIAGNOSIS — E860 Dehydration: Secondary | ICD-10-CM | POA: Diagnosis present

## 2017-02-17 DIAGNOSIS — Z419 Encounter for procedure for purposes other than remedying health state, unspecified: Secondary | ICD-10-CM

## 2017-02-17 DIAGNOSIS — M549 Dorsalgia, unspecified: Secondary | ICD-10-CM | POA: Diagnosis present

## 2017-02-17 DIAGNOSIS — F0390 Unspecified dementia without behavioral disturbance: Secondary | ICD-10-CM | POA: Diagnosis present

## 2017-02-17 DIAGNOSIS — I444 Left anterior fascicular block: Secondary | ICD-10-CM | POA: Diagnosis present

## 2017-02-17 DIAGNOSIS — Z79899 Other long term (current) drug therapy: Secondary | ICD-10-CM

## 2017-02-17 DIAGNOSIS — Z01810 Encounter for preprocedural cardiovascular examination: Secondary | ICD-10-CM | POA: Diagnosis not present

## 2017-02-17 DIAGNOSIS — N3000 Acute cystitis without hematuria: Secondary | ICD-10-CM | POA: Diagnosis present

## 2017-02-17 DIAGNOSIS — Z833 Family history of diabetes mellitus: Secondary | ICD-10-CM

## 2017-02-17 DIAGNOSIS — E872 Acidosis: Secondary | ICD-10-CM | POA: Diagnosis present

## 2017-02-17 DIAGNOSIS — Y92009 Unspecified place in unspecified non-institutional (private) residence as the place of occurrence of the external cause: Secondary | ICD-10-CM | POA: Diagnosis not present

## 2017-02-17 DIAGNOSIS — Z681 Body mass index (BMI) 19 or less, adult: Secondary | ICD-10-CM | POA: Diagnosis not present

## 2017-02-17 DIAGNOSIS — M1990 Unspecified osteoarthritis, unspecified site: Secondary | ICD-10-CM | POA: Diagnosis present

## 2017-02-17 DIAGNOSIS — R531 Weakness: Secondary | ICD-10-CM | POA: Diagnosis not present

## 2017-02-17 DIAGNOSIS — Z823 Family history of stroke: Secondary | ICD-10-CM

## 2017-02-17 DIAGNOSIS — R7989 Other specified abnormal findings of blood chemistry: Secondary | ICD-10-CM

## 2017-02-17 DIAGNOSIS — Z09 Encounter for follow-up examination after completed treatment for conditions other than malignant neoplasm: Secondary | ICD-10-CM

## 2017-02-17 DIAGNOSIS — I351 Nonrheumatic aortic (valve) insufficiency: Secondary | ICD-10-CM | POA: Diagnosis not present

## 2017-02-17 LAB — URINALYSIS, ROUTINE W REFLEX MICROSCOPIC
BILIRUBIN URINE: NEGATIVE
GLUCOSE, UA: NEGATIVE mg/dL
HGB URINE DIPSTICK: NEGATIVE
Ketones, ur: 5 mg/dL — AB
Nitrite: NEGATIVE
PROTEIN: 100 mg/dL — AB
Specific Gravity, Urine: 1.015 (ref 1.005–1.030)
Squamous Epithelial / LPF: NONE SEEN
pH: 8 (ref 5.0–8.0)

## 2017-02-17 LAB — CBC WITH DIFFERENTIAL/PLATELET
BASOS PCT: 0 %
Basophils Absolute: 0 10*3/uL (ref 0.0–0.1)
Eosinophils Absolute: 0 10*3/uL (ref 0.0–0.7)
Eosinophils Relative: 0 %
HCT: 41.4 % (ref 36.0–46.0)
HEMOGLOBIN: 14 g/dL (ref 12.0–15.0)
LYMPHS ABS: 0.7 10*3/uL (ref 0.7–4.0)
Lymphocytes Relative: 4 %
MCH: 30.8 pg (ref 26.0–34.0)
MCHC: 33.8 g/dL (ref 30.0–36.0)
MCV: 91.2 fL (ref 78.0–100.0)
MONO ABS: 1.1 10*3/uL — AB (ref 0.1–1.0)
MONOS PCT: 6 %
Neutro Abs: 18.2 10*3/uL — ABNORMAL HIGH (ref 1.7–7.7)
Neutrophils Relative %: 90 %
Platelets: 348 10*3/uL (ref 150–400)
RBC: 4.54 MIL/uL (ref 3.87–5.11)
RDW: 17.8 % — AB (ref 11.5–15.5)
WBC: 20.1 10*3/uL — ABNORMAL HIGH (ref 4.0–10.5)

## 2017-02-17 LAB — COMPREHENSIVE METABOLIC PANEL
ALK PHOS: 70 U/L (ref 38–126)
ALT: 24 U/L (ref 14–54)
AST: 23 U/L (ref 15–41)
Albumin: 3.9 g/dL (ref 3.5–5.0)
Anion gap: 14 (ref 5–15)
BUN: 32 mg/dL — ABNORMAL HIGH (ref 6–20)
CALCIUM: 9.5 mg/dL (ref 8.9–10.3)
CO2: 16 mmol/L — ABNORMAL LOW (ref 22–32)
CREATININE: 1.21 mg/dL — AB (ref 0.44–1.00)
Chloride: 102 mmol/L (ref 101–111)
GFR calc Af Amer: 46 mL/min — ABNORMAL LOW (ref >60)
GFR calc non Af Amer: 39 mL/min — ABNORMAL LOW (ref >60)
GLUCOSE: 177 mg/dL — AB (ref 65–99)
Potassium: 4.4 mmol/L (ref 3.5–5.1)
Sodium: 132 mmol/L — ABNORMAL LOW (ref 135–145)
Total Bilirubin: 1.4 mg/dL — ABNORMAL HIGH (ref 0.3–1.2)
Total Protein: 7.2 g/dL (ref 6.5–8.1)

## 2017-02-17 LAB — ACETAMINOPHEN LEVEL: Acetaminophen (Tylenol), Serum: 18 ug/mL (ref 10–30)

## 2017-02-17 LAB — TROPONIN I
TROPONIN I: 0.09 ng/mL — AB (ref <0.03)
Troponin I: 0.08 ng/mL (ref <0.03)

## 2017-02-17 LAB — SALICYLATE LEVEL

## 2017-02-17 LAB — CK: Total CK: 35 U/L — ABNORMAL LOW (ref 38–234)

## 2017-02-17 MED ORDER — ENOXAPARIN SODIUM 30 MG/0.3ML ~~LOC~~ SOLN
30.0000 mg | Freq: Every day | SUBCUTANEOUS | Status: DC
  Administered 2017-02-18: 30 mg via SUBCUTANEOUS
  Filled 2017-02-17: qty 0.3

## 2017-02-17 MED ORDER — MORPHINE SULFATE (PF) 4 MG/ML IV SOLN
2.0000 mg | INTRAVENOUS | Status: DC | PRN
  Administered 2017-02-18 – 2017-02-19 (×2): 2 mg via INTRAVENOUS
  Filled 2017-02-17 (×2): qty 1

## 2017-02-17 MED ORDER — POLYETHYLENE GLYCOL 3350 17 G PO PACK
17.0000 g | PACK | Freq: Every day | ORAL | Status: DC
  Administered 2017-02-21 – 2017-02-23 (×3): 17 g via ORAL
  Filled 2017-02-17 (×3): qty 1

## 2017-02-17 MED ORDER — ACETAMINOPHEN 650 MG RE SUPP: 650.0000 mg | Freq: Four times a day (QID) | RECTAL | Status: DC | PRN

## 2017-02-17 MED ORDER — ENOXAPARIN SODIUM 40 MG/0.4ML ~~LOC~~ SOLN
40.0000 mg | SUBCUTANEOUS | Status: DC
  Filled 2017-02-17: qty 0.4

## 2017-02-17 MED ORDER — ONDANSETRON HCL 4 MG PO TABS: 4.0000 mg | ORAL_TABLET | Freq: Four times a day (QID) | ORAL | Status: DC | PRN

## 2017-02-17 MED ORDER — ASPIRIN 81 MG PO CHEW
324.0000 mg | CHEWABLE_TABLET | Freq: Once | ORAL | Status: AC
  Administered 2017-02-17: 324 mg via ORAL
  Filled 2017-02-17: qty 4

## 2017-02-17 MED ORDER — METOPROLOL TARTRATE 25 MG PO TABS
25.0000 mg | ORAL_TABLET | Freq: Four times a day (QID) | ORAL | Status: DC
  Administered 2017-02-17 – 2017-02-18 (×3): 25 mg via ORAL
  Filled 2017-02-17 (×3): qty 1

## 2017-02-17 MED ORDER — SODIUM CHLORIDE 0.9 % IV SOLN
INTRAVENOUS | Status: DC
Start: 2017-02-17 — End: 2017-02-18
  Administered 2017-02-17 – 2017-02-18 (×2): via INTRAVENOUS

## 2017-02-17 MED ORDER — BOOST / RESOURCE BREEZE PO LIQD
1.0000 | Freq: Two times a day (BID) | ORAL | Status: DC
  Administered 2017-02-20 – 2017-02-21 (×3): 1 via ORAL

## 2017-02-17 MED ORDER — ACETAMINOPHEN 325 MG PO TABS: 650.0000 mg | ORAL_TABLET | Freq: Four times a day (QID) | ORAL | Status: DC | PRN

## 2017-02-17 MED ORDER — TRAZODONE HCL 50 MG PO TABS
50.0000 mg | ORAL_TABLET | Freq: Every evening | ORAL | Status: DC | PRN
  Administered 2017-02-21: 50 mg via ORAL
  Filled 2017-02-17: qty 1

## 2017-02-17 MED ORDER — DULOXETINE HCL 30 MG PO CPEP
30.0000 mg | ORAL_CAPSULE | Freq: Every day | ORAL | Status: DC
  Administered 2017-02-17 – 2017-02-24 (×6): 30 mg via ORAL
  Filled 2017-02-17 (×6): qty 1

## 2017-02-17 MED ORDER — ONDANSETRON HCL 4 MG/2ML IJ SOLN: 4.0000 mg | Freq: Four times a day (QID) | INTRAMUSCULAR | Status: DC | PRN

## 2017-02-17 MED ORDER — SODIUM CHLORIDE 0.9 % IV BOLUS (SEPSIS)
1000.0000 mL | Freq: Once | INTRAVENOUS | Status: AC
  Administered 2017-02-17: 1000 mL via INTRAVENOUS

## 2017-02-17 MED ORDER — SENNOSIDES-DOCUSATE SODIUM 8.6-50 MG PO TABS
1.0000 | ORAL_TABLET | Freq: Every day | ORAL | Status: DC
  Administered 2017-02-17 – 2017-02-23 (×4): 1 via ORAL
  Filled 2017-02-17 (×5): qty 1

## 2017-02-17 MED ORDER — MESALAMINE 1.2 G PO TBEC
2.4000 g | DELAYED_RELEASE_TABLET | Freq: Two times a day (BID) | ORAL | Status: DC
  Administered 2017-02-20 – 2017-02-24 (×7): 2.4 g via ORAL
  Filled 2017-02-17 (×15): qty 2

## 2017-02-17 NOTE — ED Notes (Signed)
This nurse tech attempted to help patient use female urinal but patient was unsuccessful with urinating. RN aware.

## 2017-02-17 NOTE — Progress Notes (Signed)
Patient with abdominal distention and tenderness in suprapubic area to palpation. Bladder scanned with >980ml urine in bladder. MD notified with orders for Foley cath to be inserted. Eulas Post, RN

## 2017-02-17 NOTE — Progress Notes (Signed)
[facility] - SW Note  Received request from [facility] Triage nurse to see patient and family in ED. Notes reviewed and met with patient, patient's son Richardson Landry and his wife Laretta Alstrom at bedside. Patient awake and smiling but drifted off. Son reports plan is for patient to be admitted for further evaluation and planning. They tell me they were in process of getting patient transferred to an assisted living as soon as room became available. They are naturally questioning if assisted living level of care will still make sense. They tell me ortho has been consulted. They plan to wait for recommendations. They are aware [facility] has liaison in hospital daily and that someone will follow up tomorrow after patient is admitted to unit.    Please do not hesitate to continue to contact [facility] with hospice related questions at [phone].  [facility] uses [facility] to transport their patients to home and facilities.   Thank you,  Erling Conte, LCSW [phone]

## 2017-02-17 NOTE — ED Notes (Signed)
Bed: VV61 Expected date:  Expected time:  Means of arrival:  Comments: 83 yo fall w/ hip deformity

## 2017-02-17 NOTE — ED Notes (Signed)
Patient transported to X-ray 
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
## 2017-02-17 NOTE — Progress Notes (Signed)
Rx Brief note:  Lovenox  Rx will adjust Lovenox to 30 mg daily in pt with CrCl<30 ml/min  Thanks Dorrene German 02/17/2017 10:36 PM

## 2017-02-17 NOTE — ED Notes (Signed)
Patients son Richardson Landry left with wife and their phone number is [phone].
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
## 2017-02-17 NOTE — ED Triage Notes (Signed)
Patient here with c/o fall. Pt fell in the house. Family found the patient on the floor. Family does not know went pt fell. Pt is a hospice patient. Patient lives alone at home. Pt c/o left hip pain and rating her pain 6/10.

## 2017-02-17 NOTE — ED Notes (Signed)
Call report to [place] @ 1700

## 2017-02-18 ENCOUNTER — Inpatient Hospital Stay (HOSPITAL_COMMUNITY)

## 2017-02-18 DIAGNOSIS — I351 Nonrheumatic aortic (valve) insufficiency: Secondary | ICD-10-CM

## 2017-02-18 DIAGNOSIS — R52 Pain, unspecified: Secondary | ICD-10-CM

## 2017-02-18 DIAGNOSIS — R748 Abnormal levels of other serum enzymes: Secondary | ICD-10-CM

## 2017-02-18 DIAGNOSIS — Z01810 Encounter for preprocedural cardiovascular examination: Secondary | ICD-10-CM

## 2017-02-18 DIAGNOSIS — I5041 Acute combined systolic (congestive) and diastolic (congestive) heart failure: Secondary | ICD-10-CM

## 2017-02-18 DIAGNOSIS — S72002A Fracture of unspecified part of neck of left femur, initial encounter for closed fracture: Secondary | ICD-10-CM

## 2017-02-18 LAB — BASIC METABOLIC PANEL
Anion gap: 7 (ref 5–15)
BUN: 30 mg/dL — ABNORMAL HIGH (ref 6–20)
CHLORIDE: 110 mmol/L (ref 101–111)
CO2: 19 mmol/L — ABNORMAL LOW (ref 22–32)
Calcium: 8.8 mg/dL — ABNORMAL LOW (ref 8.9–10.3)
Creatinine, Ser: 0.82 mg/dL (ref 0.44–1.00)
GFR calc Af Amer: >60 mL/min (ref >60)
GFR calc non Af Amer: >60 mL/min (ref >60)
GLUCOSE: 121 mg/dL — AB (ref 65–99)
POTASSIUM: 3.8 mmol/L (ref 3.5–5.1)
Sodium: 136 mmol/L (ref 135–145)

## 2017-02-18 LAB — ECHOCARDIOGRAM COMPLETE
AOASC: 30 cm
CHL CUP DOP CALC LVOT VTI: 17.1 cm
CHL CUP RV SYS PRESS: 70 mmHg
CHL CUP TV REG PEAK VELOCITY: 372 cm/s
E decel time: 187 msec
E/e' ratio: 15.9
FS: 23 % — AB (ref 28–44)
Height: 63 in
IVS/LV PW RATIO, ED: 1.39
LA ID, A-P, ES: 31 mm
LA diam end sys: 31 mm
LA diam index: 2.07 cm/m2
LA vol A4C: 39.8 ml
LA vol: 54.8 mL
LAVOLIN: 36.5 mL/m2
LDCA: 2.84 cm2
LV TDI E'MEDIAL: 5.22
LV dias vol index: 51 mL/m2
LV dias vol: 77 mL (ref 46–106)
LV e' LATERAL: 5.66 cm/s
LV sys vol: 48 mL — AB (ref 14–42)
LVEEAVG: 15.9
LVEEMED: 15.9
LVOT SV: 49 mL
LVOT diameter: 19 mm
LVOTPV: 88.6 cm/s
LVSYSVOLIN: 32 mL/m2
MV Dec: 187
MV pk A vel: 110 m/s
MV pk E vel: 90 m/s
MVPG: 3 mmHg
P 1/2 time: 535 ms
PISA EROA: 0.17 cm2
PW: 10 mm — AB (ref 0.6–1.1)
RV LATERAL S' VELOCITY: 7.4 cm/s
Simpson's disk: 37
Stroke v: 28 ml
TAPSE: 11.5 mm
TDI e' lateral: 5.66
TR max vel: 372 cm/s
VTI: 172 cm
Weight: 1770.73 oz

## 2017-02-18 LAB — COMPREHENSIVE METABOLIC PANEL
ALK PHOS: 58 U/L (ref 38–126)
ALT: 19 U/L (ref 14–54)
ANION GAP: 8 (ref 5–15)
AST: 17 U/L (ref 15–41)
Albumin: 3.1 g/dL — ABNORMAL LOW (ref 3.5–5.0)
BILIRUBIN TOTAL: 1 mg/dL (ref 0.3–1.2)
BUN: 31 mg/dL — ABNORMAL HIGH (ref 6–20)
CO2: 20 mmol/L — ABNORMAL LOW (ref 22–32)
Calcium: 9 mg/dL (ref 8.9–10.3)
Chloride: 109 mmol/L (ref 101–111)
Creatinine, Ser: 0.82 mg/dL (ref 0.44–1.00)
Glucose, Bld: 103 mg/dL — ABNORMAL HIGH (ref 65–99)
Potassium: 3.8 mmol/L (ref 3.5–5.1)
SODIUM: 137 mmol/L (ref 135–145)
TOTAL PROTEIN: 6 g/dL — AB (ref 6.5–8.1)

## 2017-02-18 LAB — CBC
HEMATOCRIT: 35.5 % — AB (ref 36.0–46.0)
Hemoglobin: 11.9 g/dL — ABNORMAL LOW (ref 12.0–15.0)
MCH: 30.7 pg (ref 26.0–34.0)
MCHC: 33.5 g/dL (ref 30.0–36.0)
MCV: 91.5 fL (ref 78.0–100.0)
PLATELETS: 307 10*3/uL (ref 150–400)
RBC: 3.88 MIL/uL (ref 3.87–5.11)
RDW: 18.4 % — ABNORMAL HIGH (ref 11.5–15.5)
WBC: 24.8 10*3/uL — ABNORMAL HIGH (ref 4.0–10.5)

## 2017-02-18 LAB — TROPONIN I
Troponin I: 0.07 ng/mL (ref <0.03)
Troponin I: 0.07 ng/mL (ref <0.03)

## 2017-02-18 LAB — PROTIME-INR
INR: 0.91
Prothrombin Time: 12.2 seconds (ref 11.4–15.2)

## 2017-02-18 LAB — GLUCOSE, CAPILLARY: Glucose-Capillary: 105 mg/dL — ABNORMAL HIGH (ref 65–99)

## 2017-02-18 MED ORDER — ACETAMINOPHEN 10 MG/ML IV SOLN
1000.0000 mg | Freq: Three times a day (TID) | INTRAVENOUS | Status: AC
  Administered 2017-02-18 – 2017-02-19 (×3): 1000 mg via INTRAVENOUS
  Filled 2017-02-18 (×3): qty 100

## 2017-02-18 MED ORDER — RESOURCE THICKENUP CLEAR PO POWD
ORAL | Status: DC | PRN
  Filled 2017-02-18: qty 125

## 2017-02-18 MED ORDER — ACETAMINOPHEN 500 MG PO TABS: 1000.0000 mg | ORAL_TABLET | Freq: Three times a day (TID) | ORAL | Status: DC

## 2017-02-18 MED ORDER — DEXTROSE 5 % IV SOLN
1.0000 g | INTRAVENOUS | Status: DC
  Filled 2017-02-18: qty 10

## 2017-02-18 MED ORDER — LOSARTAN POTASSIUM 25 MG PO TABS
25.0000 mg | ORAL_TABLET | Freq: Every day | ORAL | Status: DC
  Administered 2017-02-20: 25 mg via ORAL
  Filled 2017-02-18 (×2): qty 1

## 2017-02-18 MED ORDER — DEXTROSE 5 % IV SOLN
1.0000 g | INTRAVENOUS | Status: DC
  Administered 2017-02-18 – 2017-02-21 (×3): 1 g via INTRAVENOUS
  Filled 2017-02-18 (×6): qty 10

## 2017-02-18 MED ORDER — ENOXAPARIN SODIUM 40 MG/0.4ML ~~LOC~~ SOLN
40.0000 mg | Freq: Every day | SUBCUTANEOUS | Status: DC
  Administered 2017-02-18: 40 mg via SUBCUTANEOUS
  Filled 2017-02-18: qty 0.4

## 2017-02-18 MED ORDER — SODIUM CHLORIDE 0.9 % IV SOLN: INTRAVENOUS | Status: DC

## 2017-02-18 MED ORDER — METOPROLOL SUCCINATE ER 50 MG PO TB24
50.0000 mg | ORAL_TABLET | Freq: Every day | ORAL | Status: DC
  Administered 2017-02-19 – 2017-02-24 (×6): 50 mg via ORAL
  Filled 2017-02-18 (×6): qty 1

## 2017-02-18 MED ORDER — SULFAMETHOXAZOLE-TRIMETHOPRIM 800-160 MG PO TABS
1.0000 | ORAL_TABLET | Freq: Two times a day (BID) | ORAL | Status: DC
  Administered 2017-02-18: 1 via ORAL
  Filled 2017-02-18: qty 1

## 2017-02-18 MED ORDER — METOPROLOL SUCCINATE ER 25 MG PO TB24
25.0000 mg | ORAL_TABLET | Freq: Every day | ORAL | Status: AC
  Administered 2017-02-18: 25 mg via ORAL
  Filled 2017-02-18 (×2): qty 1

## 2017-02-18 NOTE — Consult Note (Signed)
[facility] (OT[facility]) Consult   Patient ID: Stacy Mcguire MRN: [id number] DOB/AGE: 12-12-1927 83 y.o.   Reason for Consult:Left femoral neck fracture Referring Physician: Louellen Molder, MD  HPI: Stacy Mcguire is an 83 y.o. female who is being seen in consultation at the request of Dr. Clementeen Graham for evaluation of left femoral neck fracture. Stacy Mcguire is a very frail female that most history is obtained by the chart and discussion with the family. She has been a minimal ambulator over the last 6 months or so and has had a lot of suicidal ideation as well. According to her son she spends most of the time in bed but occasionally gets up to get food or walk around her apartment. She is currently on home hospice for lack of weight gain. She has a significant cardiac history. An echo was done and showed a EF of 20% and cardiology was consulted. The patient is able to respond to questions but really cannot have an in depth conversation.  Past Medical History:  Diagnosis Date  . Anemia, unspecified   . Anorexia   . Arthritis   . Depression   . Diverticulosis 2010   Colonoscopy  . Failure to thrive in adult   . GERD (gastroesophageal reflux disease)   . Hiatal hernia 2010   EGD   . Hot flashes   . Internal hemorrhoids 2010   Colonoscopy  . Kidney disease, chronic, stage III (moderate, EGFR 30-59 ml/min)   . Migraine   . Osteoarthritis   . Shingles   . 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 UTI (urinary tract infection)     Past Surgical History:  Procedure Laterality Date  . ABDOMINAL HYSTERECTOMY    . KNEE SURGERY    . MASTOIDECTOMY    . NOSE SURGERY    . TONSILLECTOMY      Family History  Problem Relation Age of Onset  . Diabetes Mother   . Hypertension Father   . Breast cancer Mother   . Stroke Mother 43  . Breast cancer Sister 34  . Diabetes Son 16  . Prostate cancer Son   . Colon cancer Neg Hx     Social History:  reports that she  has never smoked. She has never used smokeless tobacco. She reports that she does not drink alcohol or use drugs.  Allergies:  Allergies  Allergen Reactions  . Neosporin Af [Miconazole] Itching    Medications: I have reviewed the patient's current medications.  ROS: Unable to obtain due to patient factors  Blood pressure (!) 149/83, pulse 86, temperature 98.9 F (37.2 C), temperature source Oral, resp. rate 18, height _0  (1.6 m), weight 50.2 kg (110 lb 10.7 oz), SpO2 98 %. Exam: Lying in bed, frail, drowsy. LLE: held shortened and externally rotated. No active skin lesions or bruising, ROM not performed other than logroll which has significant amount of pain. Active EHL/FHL/TA/GSC. Sensation grossly intact.  RLE: Skin without lesions, no tenderness to palpation, full painless ROM with full strength without instability  Imaging: Displaced left subcapital femoral neck fracture  Assessment/Plan: 83 year old female on home hospice with displaced left femoral neck fracture  -Patient is minimal ambulator at baseline but is in significant amount of pain with fracture -Discussed care at length over the phone with son and daughter in law and I feel that she would be bedbound for likely 6-8 weeks at least with significant pain. I feel that the surgery  would be a palliative measure for pain control. -Cardiology involved and she has significant heart issues and is high risk for surgery which complicates the picture -Currently the patient is on the schedule with Dr. Lyla Glassing tomorrow for left hip hemiarthroplasty and will be transferred to [facility]. Further discussion will be had with the son about surgery.   Shona Needles, MD [facility] [phone] (phone)

## 2017-02-18 NOTE — Progress Notes (Addendum)
[facility]-GIP RN Visit.  This is a related and covered GIP admission of 02/17/2017 with [facility] diagnosis of Protein malnutrition per Dr. Konrad Dolores. Patient code status: DNR. Family called EMS after finding patient had fallen and had been down several hours. Hospice was notified. Admitted to hospital for fractured left hip.   Spoke with patient at bedside. No family present at visit. Patient is awake and alert. Denies pain or discomfort.  Respirations normal and unlabored. She has a foley catheter draining clear yellow urine. IV in left forearm.  Patient unable to answer if any decisions have been made regarding surgery or if she has had visitors this morning.   Continuous Medications: NS@75ml /hr. Antibiotics:  sulfamethoxazole-trimethoprim 800-160mg  Q12hrs  PRN medications: no prn medications have been given.   Dr. Konrad Dolores and Dr. Drema Dallas notified of admission. Transfer Summary and medication list placed on shadow chart.   Will continue to follow while hospitalized and anticipate discharge needs.   Thank you.   Farrel Gordon RN, [facility] Liaison  [phone]    All Hospital Liaisons are on AMION
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
## 2017-02-18 NOTE — Progress Notes (Signed)
Unable to obtain Medical history from patient due to current condition and no family at bedside. Will obtain as soon as  Possible. Eulas Post, RN
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
## 2017-02-18 NOTE — Progress Notes (Signed)
  Echocardiogram 2D Echocardiogram has been performed.  Stacy Mcguire 02/18/2017, 11:29 AM
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
## 2017-02-19 ENCOUNTER — Inpatient Hospital Stay (HOSPITAL_COMMUNITY): Admitting: Anesthesiology

## 2017-02-19 ENCOUNTER — Encounter (HOSPITAL_COMMUNITY)
Admission: EM | Disposition: A | Discharge status: Skilled Nursing Facility | Payer: Self-pay | Source: Home / Self Care | Attending: Nephrology

## 2017-02-19 ENCOUNTER — Inpatient Hospital Stay (HOSPITAL_COMMUNITY)

## 2017-02-19 ENCOUNTER — Encounter (HOSPITAL_COMMUNITY): Payer: Self-pay | Admitting: Radiology

## 2017-02-19 DIAGNOSIS — S72002A Fracture of unspecified part of neck of left femur, initial encounter for closed fracture: Secondary | ICD-10-CM | POA: Diagnosis present

## 2017-02-19 HISTORY — PX: HEMIARTHROPLASTY HIP: SUR652

## 2017-02-19 HISTORY — PX: HIP ARTHROPLASTY: SHX981

## 2017-02-19 LAB — BASIC METABOLIC PANEL
ANION GAP: 11 (ref 5–15)
BUN: 23 mg/dL — AB (ref 6–20)
CHLORIDE: 109 mmol/L (ref 101–111)
CO2: 19 mmol/L — ABNORMAL LOW (ref 22–32)
Calcium: 9.2 mg/dL (ref 8.9–10.3)
Creatinine, Ser: 0.65 mg/dL (ref 0.44–1.00)
GFR calc Af Amer: >60 mL/min (ref >60)
GFR calc non Af Amer: >60 mL/min (ref >60)
GLUCOSE: 96 mg/dL (ref 65–99)
POTASSIUM: 3.6 mmol/L (ref 3.5–5.1)
Sodium: 139 mmol/L (ref 135–145)

## 2017-02-19 LAB — HEPATIC FUNCTION PANEL
ALBUMIN: 3.2 g/dL — AB (ref 3.5–5.0)
ALT: 17 U/L (ref 14–54)
AST: 17 U/L (ref 15–41)
Alkaline Phosphatase: 66 U/L (ref 38–126)
Total Bilirubin: 0.6 mg/dL (ref 0.3–1.2)
Total Protein: 6.4 g/dL — ABNORMAL LOW (ref 6.5–8.1)

## 2017-02-19 LAB — CBC
HCT: 36.4 % (ref 36.0–46.0)
HCT: 28.8 % — ABNORMAL LOW (ref 36.0–46.0)
Hemoglobin: 11.8 g/dL — ABNORMAL LOW (ref 12.0–15.0)
Hemoglobin: 9.2 g/dL — ABNORMAL LOW (ref 12.0–15.0)
MCH: 29.9 pg (ref 26.0–34.0)
MCH: 29.9 pg (ref 26.0–34.0)
MCHC: 32.4 g/dL (ref 30.0–36.0)
MCHC: 31.9 g/dL (ref 30.0–36.0)
MCV: 92.2 fL (ref 78.0–100.0)
MCV: 93.5 fL (ref 78.0–100.0)
Platelets: 294 10*3/uL (ref 150–400)
Platelets: 249 10*3/uL (ref 150–400)
RBC: 3.95 MIL/uL (ref 3.87–5.11)
RBC: 3.08 MIL/uL — ABNORMAL LOW (ref 3.87–5.11)
RDW: 18.6 % — ABNORMAL HIGH (ref 11.5–15.5)
RDW: 18.3 % — AB (ref 11.5–15.5)
WBC: 26.5 10*3/uL — AB (ref 4.0–10.5)
WBC: 16.5 10*3/uL — ABNORMAL HIGH (ref 4.0–10.5)

## 2017-02-19 LAB — LIPID PANEL
CHOLESTEROL: 139 mg/dL (ref 0–200)
HDL: 49 mg/dL (ref >40)
LDL Cholesterol: 71 mg/dL (ref 0–99)
Total CHOL/HDL Ratio: 2.8 RATIO
Triglycerides: 95 mg/dL (ref <150)
VLDL: 19 mg/dL (ref 0–40)

## 2017-02-19 LAB — CREATININE, SERUM
CREATININE: 0.67 mg/dL (ref 0.44–1.00)
GFR calc Af Amer: >60 mL/min (ref >60)

## 2017-02-19 LAB — GLUCOSE, CAPILLARY: Glucose-Capillary: 95 mg/dL (ref 65–99)

## 2017-02-19 LAB — LIPASE, BLOOD: Lipase: 16 U/L (ref 11–51)

## 2017-02-19 SURGERY — HEMIARTHROPLASTY, HIP, DIRECT ANTERIOR APPROACH, FOR FRACTURE
Anesthesia: General | Site: Hip | Laterality: Left

## 2017-02-19 MED ORDER — ONDANSETRON HCL 4 MG/2ML IJ SOLN: 4.0000 mg | Freq: Once | INTRAMUSCULAR | Status: DC | PRN

## 2017-02-19 MED ORDER — MEPERIDINE HCL 25 MG/ML IJ SOLN: 6.2500 mg | INTRAMUSCULAR | Status: DC | PRN

## 2017-02-19 MED ORDER — ACETAMINOPHEN 650 MG RE SUPP: 650.0000 mg | Freq: Four times a day (QID) | RECTAL | Status: DC | PRN

## 2017-02-19 MED ORDER — HYDROCODONE-ACETAMINOPHEN 5-325 MG PO TABS
1.0000 | ORAL_TABLET | Freq: Four times a day (QID) | ORAL | Status: DC | PRN
  Administered 2017-02-23 – 2017-02-24 (×2): 1 via ORAL
  Filled 2017-02-19 (×2): qty 1

## 2017-02-19 MED ORDER — SUCCINYLCHOLINE CHLORIDE 20 MG/ML IJ SOLN
INTRAMUSCULAR | Status: DC | PRN
  Administered 2017-02-19: 80 mg via INTRAVENOUS

## 2017-02-19 MED ORDER — PROPOFOL 10 MG/ML IV BOLUS
INTRAVENOUS | Status: AC
  Filled 2017-02-19: qty 20

## 2017-02-19 MED ORDER — LIDOCAINE 2% (20 MG/ML) 5 ML SYRINGE
INTRAMUSCULAR | Status: AC
  Filled 2017-02-19: qty 5

## 2017-02-19 MED ORDER — ALBUMIN HUMAN 5 % IV SOLN
INTRAVENOUS | Status: DC | PRN
  Administered 2017-02-19: 16:00:00 via INTRAVENOUS

## 2017-02-19 MED ORDER — CEFTRIAXONE SODIUM 1 G IJ SOLR
1.0000 g | Freq: Once | INTRAMUSCULAR | Status: AC
  Administered 2017-02-19: 1 g via INTRAVENOUS
  Filled 2017-02-19 (×2): qty 10

## 2017-02-19 MED ORDER — CHLORHEXIDINE GLUCONATE 4 % EX LIQD: 60.0000 mL | Freq: Once | CUTANEOUS | Status: DC

## 2017-02-19 MED ORDER — PHENYLEPHRINE HCL 10 MG/ML IJ SOLN
INTRAMUSCULAR | Status: DC | PRN
  Administered 2017-02-19: 120 ug via INTRAVENOUS

## 2017-02-19 MED ORDER — FENTANYL CITRATE (PF) 250 MCG/5ML IJ SOLN
INTRAMUSCULAR | Status: AC
  Filled 2017-02-19: qty 5

## 2017-02-19 MED ORDER — IOPAMIDOL (ISOVUE-300) INJECTION 61%
75.0000 mL | Freq: Once | INTRAVENOUS | Status: AC | PRN
  Administered 2017-02-19: 70 mL via INTRAVENOUS

## 2017-02-19 MED ORDER — DEXTROSE 5 % IV SOLN
INTRAVENOUS | Status: DC | PRN
  Administered 2017-02-19: 50 ug/min via INTRAVENOUS

## 2017-02-19 MED ORDER — CEFAZOLIN SODIUM-DEXTROSE 2-4 GM/100ML-% IV SOLN
INTRAVENOUS | Status: AC
  Filled 2017-02-19: qty 100

## 2017-02-19 MED ORDER — NEOSTIGMINE METHYLSULFATE 10 MG/10ML IV SOLN
INTRAVENOUS | Status: DC | PRN
  Administered 2017-02-19: 2 mg via INTRAVENOUS

## 2017-02-19 MED ORDER — IOPAMIDOL (ISOVUE-300) INJECTION 61%
INTRAVENOUS | Status: AC
  Filled 2017-02-19: qty 100

## 2017-02-19 MED ORDER — IOPAMIDOL (ISOVUE-300) INJECTION 61%
INTRAVENOUS | Status: AC
  Filled 2017-02-19: qty 75

## 2017-02-19 MED ORDER — PHENOL 1.4 % MT LIQD: 1.0000 | OROMUCOSAL | Status: DC | PRN

## 2017-02-19 MED ORDER — METOCLOPRAMIDE HCL 5 MG PO TABS: 5.0000 mg | ORAL_TABLET | Freq: Three times a day (TID) | ORAL | Status: DC | PRN

## 2017-02-19 MED ORDER — FENTANYL CITRATE (PF) 100 MCG/2ML IJ SOLN
INTRAMUSCULAR | Status: AC
  Filled 2017-02-19: qty 2

## 2017-02-19 MED ORDER — GLYCOPYRROLATE 0.2 MG/ML IJ SOLN
INTRAMUSCULAR | Status: DC | PRN
  Administered 2017-02-19: 0.4 mg via INTRAVENOUS

## 2017-02-19 MED ORDER — TRANEXAMIC ACID 1000 MG/10ML IV SOLN
1000.0000 mg | INTRAVENOUS | Status: AC
  Administered 2017-02-19: 1000 mg via INTRAVENOUS
  Filled 2017-02-19: qty 1100

## 2017-02-19 MED ORDER — 0.9 % SODIUM CHLORIDE (POUR BTL) OPTIME
TOPICAL | Status: DC | PRN
  Administered 2017-02-19: 1000 mL

## 2017-02-19 MED ORDER — POVIDONE-IODINE 10 % EX SWAB: 2.0000 "application " | Freq: Once | CUTANEOUS | Status: DC

## 2017-02-19 MED ORDER — SODIUM CHLORIDE 0.9 % IR SOLN
Status: DC | PRN
  Administered 2017-02-19: 3000 mL

## 2017-02-19 MED ORDER — ACETAMINOPHEN 325 MG PO TABS
650.0000 mg | ORAL_TABLET | Freq: Four times a day (QID) | ORAL | Status: DC | PRN
  Administered 2017-02-20 – 2017-02-22 (×3): 650 mg via ORAL
  Filled 2017-02-19 (×3): qty 2

## 2017-02-19 MED ORDER — FENTANYL CITRATE (PF) 100 MCG/2ML IJ SOLN
25.0000 ug | INTRAMUSCULAR | Status: DC | PRN
  Administered 2017-02-19: 25 ug via INTRAVENOUS

## 2017-02-19 MED ORDER — PHENYLEPHRINE 40 MCG/ML (10ML) SYRINGE FOR IV PUSH (FOR BLOOD PRESSURE SUPPORT)
PREFILLED_SYRINGE | INTRAVENOUS | Status: AC
  Filled 2017-02-19: qty 10

## 2017-02-19 MED ORDER — FENTANYL CITRATE (PF) 100 MCG/2ML IJ SOLN
INTRAMUSCULAR | Status: DC | PRN
  Administered 2017-02-19: 50 ug via INTRAVENOUS
  Administered 2017-02-19 (×2): 25 ug via INTRAVENOUS

## 2017-02-19 MED ORDER — CEFAZOLIN SODIUM-DEXTROSE 2-4 GM/100ML-% IV SOLN
2.0000 g | Freq: Four times a day (QID) | INTRAVENOUS | Status: AC
  Administered 2017-02-19 – 2017-02-20 (×2): 2 g via INTRAVENOUS
  Filled 2017-02-19 (×2): qty 100

## 2017-02-19 MED ORDER — PROMETHAZINE HCL 25 MG/ML IJ SOLN: 6.2500 mg | INTRAMUSCULAR | Status: DC | PRN

## 2017-02-19 MED ORDER — BUPIVACAINE-EPINEPHRINE (PF) 0.5% -1:200000 IJ SOLN
INTRAMUSCULAR | Status: DC | PRN
  Administered 2017-02-19: 30 mL via PERINEURAL

## 2017-02-19 MED ORDER — ENOXAPARIN SODIUM 30 MG/0.3ML ~~LOC~~ SOLN
30.0000 mg | SUBCUTANEOUS | Status: DC
  Administered 2017-02-20 – 2017-02-24 (×5): 30 mg via SUBCUTANEOUS
  Filled 2017-02-19 (×6): qty 0.3

## 2017-02-19 MED ORDER — CISATRACURIUM BESYLATE 20 MG/10ML IV SOLN
INTRAVENOUS | Status: AC
  Filled 2017-02-19: qty 10

## 2017-02-19 MED ORDER — LIDOCAINE HCL (CARDIAC) 20 MG/ML IV SOLN
INTRAVENOUS | Status: DC | PRN
  Administered 2017-02-19: 30 mg via INTRAVENOUS

## 2017-02-19 MED ORDER — CEFAZOLIN SODIUM-DEXTROSE 2-4 GM/100ML-% IV SOLN
2.0000 g | INTRAVENOUS | Status: AC
  Administered 2017-02-19: 2 g via INTRAVENOUS
  Filled 2017-02-19: qty 100

## 2017-02-19 MED ORDER — ACETAMINOPHEN 500 MG PO TABS: 1000.0000 mg | ORAL_TABLET | Freq: Three times a day (TID) | ORAL | Status: DC

## 2017-02-19 MED ORDER — PROPOFOL 10 MG/ML IV BOLUS
INTRAVENOUS | Status: DC | PRN
  Administered 2017-02-19: 50 mg via INTRAVENOUS

## 2017-02-19 MED ORDER — LACTATED RINGERS IV SOLN
INTRAVENOUS | Status: DC
  Administered 2017-02-19: 11:00:00 via INTRAVENOUS

## 2017-02-19 MED ORDER — HYDROMORPHONE HCL 1 MG/ML IJ SOLN: 0.2500 mg | INTRAMUSCULAR | Status: DC | PRN

## 2017-02-19 MED ORDER — ONDANSETRON HCL 4 MG/2ML IJ SOLN
INTRAMUSCULAR | Status: DC | PRN
  Administered 2017-02-19: 4 mg via INTRAVENOUS

## 2017-02-19 MED ORDER — METOCLOPRAMIDE HCL 5 MG/ML IJ SOLN: 5.0000 mg | Freq: Three times a day (TID) | INTRAMUSCULAR | Status: DC | PRN

## 2017-02-19 MED ORDER — KETOROLAC TROMETHAMINE 30 MG/ML IJ SOLN
INTRAMUSCULAR | Status: DC | PRN
  Administered 2017-02-19: 30 mg via INTRAVENOUS

## 2017-02-19 MED ORDER — BUPIVACAINE-EPINEPHRINE (PF) 0.5% -1:200000 IJ SOLN
INTRAMUSCULAR | Status: AC
  Filled 2017-02-19: qty 30

## 2017-02-19 MED ORDER — MENTHOL 3 MG MT LOZG: 1.0000 | LOZENGE | OROMUCOSAL | Status: DC | PRN

## 2017-02-19 MED ORDER — CISATRACURIUM 2MG/ML (10ML) SYRINGE FOR MED FUSION PUMP - OPTIME
INTRAVENOUS | Status: DC | PRN
  Administered 2017-02-19: 4 mg via INTRAVENOUS

## 2017-02-19 MED ORDER — SODIUM CHLORIDE 0.9 % IJ SOLN
INTRAMUSCULAR | Status: DC | PRN
Start: 2017-02-19 — End: 2017-02-19
  Administered 2017-02-19: 30 mL via INTRAVENOUS

## 2017-02-19 SURGICAL SUPPLY — 54 items
BLADE CLIPPER SURG (BLADE) IMPLANT
BLADE SAW SGTL 18X1.27X75 (BLADE) ×2 IMPLANT
BLADE SAW SGTL 18X1.27X75MM (BLADE) ×1
CAPT HIP HEMI 2 ×3 IMPLANT
CHLORAPREP W/TINT 26ML (MISCELLANEOUS) ×3 IMPLANT
COVER SURGICAL LIGHT HANDLE (MISCELLANEOUS) ×3 IMPLANT
DERMABOND ADVANCED (GAUZE/BANDAGES/DRESSINGS) ×4
DERMABOND ADVANCED .7 DNX12 (GAUZE/BANDAGES/DRESSINGS) ×2 IMPLANT
DRAPE C-ARM 42X72 X-RAY (DRAPES) ×3 IMPLANT
DRAPE IMP U-DRAPE 54X76 (DRAPES) ×6 IMPLANT
DRAPE STERI IOBAN 125X83 (DRAPES) ×3 IMPLANT
DRAPE U-SHAPE 47X51 STRL (DRAPES) ×9 IMPLANT
DRSG AQUACEL AG ADV 3.5X10 (GAUZE/BANDAGES/DRESSINGS) ×3 IMPLANT
ELECT BLADE 4.0 EZ CLEAN MEGAD (MISCELLANEOUS) ×3
ELECT REM PT RETURN 9FT ADLT (ELECTROSURGICAL) ×3
ELECTRODE BLDE 4.0 EZ CLN MEGD (MISCELLANEOUS) ×1 IMPLANT
ELECTRODE REM PT RTRN 9FT ADLT (ELECTROSURGICAL) ×1 IMPLANT
EVACUATOR 1/8 PVC DRAIN (DRAIN) IMPLANT
GLOVE BIO SURGEON STRL SZ8.5 (GLOVE) ×6 IMPLANT
GLOVE BIOGEL PI IND STRL 8.5 (GLOVE) ×1 IMPLANT
GLOVE BIOGEL PI INDICATOR 8.5 (GLOVE) ×2
GOWN STRL REUS W/ TWL LRG LVL3 (GOWN DISPOSABLE) ×2 IMPLANT
GOWN STRL REUS W/TWL 2XL LVL3 (GOWN DISPOSABLE) ×3 IMPLANT
GOWN STRL REUS W/TWL LRG LVL3 (GOWN DISPOSABLE) ×4
HANDPIECE INTERPULSE COAX TIP (DISPOSABLE) ×2
HEAD FEM UNIPOLAR 47 OD STRL (Hips) IMPLANT
HOOD PEEL AWAY FACE SHEILD DIS (HOOD) ×6 IMPLANT
KIT BASIN OR (CUSTOM PROCEDURE TRAY) ×3 IMPLANT
KIT ROOM TURNOVER OR (KITS) ×3 IMPLANT
MANIFOLD NEPTUNE II (INSTRUMENTS) ×3 IMPLANT
MARKER SKIN DUAL TIP RULER LAB (MISCELLANEOUS) ×3 IMPLANT
NEEDLE SPNL 18GX3.5 QUINCKE PK (NEEDLE) ×3 IMPLANT
NS IRRIG 1000ML POUR BTL (IV SOLUTION) ×3 IMPLANT
PACK TOTAL JOINT (CUSTOM PROCEDURE TRAY) ×3 IMPLANT
PACK UNIVERSAL I (CUSTOM PROCEDURE TRAY) ×3 IMPLANT
PAD ARMBOARD 7.5X6 YLW CONV (MISCELLANEOUS) ×6 IMPLANT
SEALER BIPOLAR AQUA 6.0 (INSTRUMENTS) IMPLANT
SET HNDPC FAN SPRY TIP SCT (DISPOSABLE) ×1 IMPLANT
SPACER FEM TAPERED +0 12/14 (Hips) IMPLANT
SUCTION FRAZIER HANDLE 10FR (MISCELLANEOUS) ×2
SUCTION TUBE FRAZIER 10FR DISP (MISCELLANEOUS) ×1 IMPLANT
SUT ETHIBOND NAB CT1 #1 30IN (SUTURE) ×6 IMPLANT
SUT MNCRL AB 3-0 PS2 18 (SUTURE) ×3 IMPLANT
SUT MON AB 2-0 CT1 36 (SUTURE) ×6 IMPLANT
SUT VIC AB 1 CT1 27 (SUTURE) ×2
SUT VIC AB 1 CT1 27XBRD ANBCTR (SUTURE) ×1 IMPLANT
SUT VIC AB 2-0 CT1 27 (SUTURE) ×2
SUT VIC AB 2-0 CT1 TAPERPNT 27 (SUTURE) ×1 IMPLANT
SUT VLOC 180 0 24IN GS25 (SUTURE) ×3 IMPLANT
SYR 50ML LL SCALE MARK (SYRINGE) ×3 IMPLANT
TOWEL OR 17X24 6PK STRL BLUE (TOWEL DISPOSABLE) ×3 IMPLANT
TOWEL OR 17X26 10 PK STRL BLUE (TOWEL DISPOSABLE) ×3 IMPLANT
TRAY FOLEY CATH SILVER 16FR (SET/KITS/TRAYS/PACK) IMPLANT
WATER STERILE IRR 1000ML POUR (IV SOLUTION) ×9 IMPLANT

## 2017-02-19 NOTE — Progress Notes (Signed)
[facility] MSW note: This is related hospice admission. Pt is a current hospice home care patient. Pt is a DNR. Pt lives alone. Son-Steve and daughter in law-Leesa visit pt 2x day for meals and medication dispensing. Pt has had 2 past suicide attempts. Last attempt was in June 2018. Son and daughter in law do not feel pt is safe to live alone and was seeking assisted placement prior to her recent fall and hospitalization. Pt was asleep during visit and did not arouse when MSW called her name. MSW met with Lanetta Inch, RN liaison to discuss case. RN stated that pt was just given medication so pt may be asleep due medication. MSW spoke with Son-Steve by phone. Son stated that pt is scheduled to be transferred to [facility] today with left hip surgery this afternoon. Son expressed desire for pt to go to rehab in skilled nursing home at hospitalization then go further long term placement at skilled care or assisted living depending where pt is at after rehab. MSW educated son on hospice revocation process if pt goes to rehab. Son stated that he will choose to revoke hpcg services in order for medicare to pay rehab at hospital discharge for pt.   Marilynne Halsted, MSW [phone]

## 2017-02-19 NOTE — Anesthesia Procedure Notes (Signed)
Procedure Name: Intubation Date/Time: 02/19/2017 3:45 PM Performed by: Eligha Bridegroom Pre-anesthesia Checklist: Patient identified, Emergency Drugs available, Suction available, Patient being monitored and Timeout performed Patient Re-evaluated:Patient Re-evaluated prior to induction Oxygen Delivery Method: Circle system utilized Preoxygenation: Pre-oxygenation with 100% oxygen Induction Type: IV induction Ventilation: Mask ventilation without difficulty Laryngoscope Size: Mac and 3 Grade View: Grade II Tube type: Oral Tube size: 7.0 mm Number of attempts: 1 Airway Equipment and Method: Stylet Placement Confirmation: ETT inserted through vocal cords under direct vision,  positive ETCO2 and breath sounds checked- equal and bilateral Secured at: 21 cm Tube secured with: Tape

## 2017-02-19 NOTE — Progress Notes (Signed)
SLP Cancellation Note  Patient Details Name: Stacy Mcguire MRN: [id number] DOB: [birth year]   Cancelled treatment:       Reason Eval/Treat Not Completed: Medical issues which prohibited therapy (pt npo for surgery)  Luanna Salk, [facility] SLP [phone]  [name] 02/19/2017, 8:23 AM

## 2017-02-19 NOTE — H&P (View-Only) (Signed)
Orthopaedic Trauma Service (OTS) Consult   Patient ID: [name] MRN: [id number] DOB/AGE: 12-12-1927 83 y.o.   Reason for Consult:Left femoral neck fracture Referring Physician: Louellen Molder, MD  HPI: Stacy Mcguire is an 83 y.o. female who is being seen in consultation at the request of Dr. Clementeen Mcguire for evaluation of left femoral neck fracture. Stacy Mcguire is a very frail female that most history is obtained by the chart and discussion with the family. She has been a minimal ambulator over the last 6 months or so and has had a lot of suicidal ideation as well. According to her son she spends most of the time in bed but occasionally gets up to get food or walk around her apartment. She is currently on home hospice for lack of weight gain. She has a significant cardiac history. An echo was done and showed a EF of 20% and cardiology was consulted. The patient is able to respond to questions but really cannot have an in depth conversation.  Past Medical History:  Diagnosis Date  . Anemia, unspecified   . Anorexia   . Arthritis   . Depression   . Diverticulosis 2010   Colonoscopy  . Failure to thrive in adult   . GERD (gastroesophageal reflux disease)   . Hiatal hernia 2010   EGD   . Hot flashes   . Internal hemorrhoids 2010   Colonoscopy  . Kidney disease, chronic, stage III (moderate, EGFR 30-59 ml/min)   . Migraine   . Osteoarthritis   . Shingles   . 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 UTI (urinary tract infection)     Past Surgical History:  Procedure Laterality Date  . ABDOMINAL HYSTERECTOMY    . KNEE SURGERY    . MASTOIDECTOMY    . NOSE SURGERY    . TONSILLECTOMY      Family History  Problem Relation Age of Onset  . Diabetes Mother   . Hypertension Father   . Breast cancer Mother   . Stroke Mother 43  . Breast cancer Sister 34  . Diabetes Son 16  . Prostate cancer Son   . Colon cancer Neg Hx     Social History:  reports that she  has never smoked. She has never used smokeless tobacco. She reports that she does not drink alcohol or use drugs.  Allergies:  Allergies  Allergen Reactions  . Neosporin Af [Miconazole] Itching    Medications: I have reviewed the patient's current medications.  ROS: Unable to obtain due to patient factors  Blood pressure (!) 149/83, pulse 86, temperature 98.9 F (37.2 C), temperature source Oral, resp. rate 18, height _0  (1.6 m), weight 50.2 kg (110 lb 10.7 oz), SpO2 98 %. Exam: Lying in bed, frail, drowsy. LLE: held shortened and externally rotated. No active skin lesions or bruising, ROM not performed other than logroll which has significant amount of pain. Active EHL/FHL/TA/GSC. Sensation grossly intact.  RLE: Skin without lesions, no tenderness to palpation, full painless ROM with full strength without instability  Imaging: Displaced left subcapital femoral neck fracture  Assessment/Plan: 83 year old female on home hospice with displaced left femoral neck fracture  -Patient is minimal ambulator at baseline but is in significant amount of pain with fracture -Discussed care at length over the phone with son and daughter in law and I feel that she would be bedbound for likely 6-8 weeks at least with significant pain. I feel that the surgery  would be a palliative measure for pain control. -Cardiology involved and she has significant heart issues and is high risk for surgery which complicates the picture -Currently the patient is on the schedule with Dr. Lyla Mcguire tomorrow for left hip hemiarthroplasty and will be transferred to [facility]. Further discussion will be had with the son about surgery.   Stacy Needles, MD [facility] [phone] (phone)

## 2017-02-19 NOTE — Transfer of Care (Signed)
3Immediate Anesthesia Transfer of Care Note  Patient: Stacy Mcguire  Procedure(s) Performed: Procedure(s): ARTHROPLASTY BIPOLAR HIP (HEMIARTHROPLASTY) (Left)  Patient Location: PACU  Anesthesia Type:General  Level of Consciousness: awake  Airway & Oxygen Therapy: Patient Spontanous Breathing and Patient connected to nasal cannula oxygen  Post-op Assessment: Report given to RN and Post -op Vital signs reviewed and stable  Post vital signs: Reviewed and stable  Last Vitals:  Vitals:   02/19/17 1815 02/19/17 1827  BP: (!) 193/80 (!) 158/81  Pulse: 92 84  Resp: 20 15  Temp:    SpO2: 96% 96%    Last Pain:  Vitals:   02/19/17 1827  TempSrc:   PainSc: Asleep         Complications: No apparent anesthesia complications

## 2017-02-19 NOTE — Op Note (Signed)
OPERATIVE REPORT  SURGEON: Rod Can, MD   ASSISTANT: Ky Barban, RNFA  PREOPERATIVE DIAGNOSIS: Displaced Left femoral neck fracture.   POSTOPERATIVE DIAGNOSIS: Displaced Left femoral neck fracture.   PROCEDURE: Left hip hemiarthroplasty, anterior approach.   IMPLANTS: DePuy Tri Lock stem, size 7, std offset, with a +5 mm spacer and a 47 mm monopolar head ball.  ANESTHESIA:  General  ANTIBIOTICS: 2g ancef.  ESTIMATED BLOOD LOSS: 150 mL.  DRAINS: None.  COMPLICATIONS: None   CONDITION: PACU - hemodynamically stable.   BRIEF CLINICAL NOTE: Stacy Mcguire is a 84 y.o. female who is a minimal household ambulator with a displaced Left femoral neck fracture. The patient was admitted to the hospitalist service and underwent perioperative risk stratification and medical optimization. The risks, benefits, and alternatives to hemiarthroplasty were explained, and the patient / family elected to proceed.  PROCEDURE IN DETAIL: The patient was taken to the operating room and general anesthesia was induced on the hospital bed. The patient was then positioned on the Hana table. All bony prominences were well padded. The hip was prepped and draped in the normal sterile surgical fashion. A time-out was called verifying side and site of surgery. Antibiotics were given within 60 minutes of beginning the procedure.  The direct anterior approach to the hip was performed through the Hueter interval. Lateral femoral circumflex vessels were treated with the Auqumantys. The anterior capsule was exposed and an inverted T capsulotomy was made. Fracture hematoma was encountered and evacuated. The patient was found to have a comminuted Left subcapital femoral neck fracture. I freshened the femoral neck cut with a saw. I removed the femoral neck fragment. A corkscrew was placed into the head and the head was removed. This was passed to the back table and was measured.  Acetabular exposure  was achieved. I examined the articular cartilage which was intact. The labrum was intact. A 47 mm trial head was placed and found to have excellent fit.  I then gained femoral exposure taking care to protect the abductors and greater trochanter. This was performed using standard external rotation, extension, and adduction. The capsule was peeled off the inner aspect of the greater trochanter, taking care to preserve the short external rotators. A cookie cutter was used to enter the femoral canal, and then the femoral canal finder was used to confirm location. I then sequentially broached up to a size 7. Calcar planer was used on the femoral neck remnant. I paced a std neck and a 36 + 1.5 head ball.The hip was reduced. Leg lengths were checked fluoroscopically. The hip was dislocated and trial components were removed. I placed the real stem followed by the real spacer and head ball. A single reduction maneuver was performed and the hip was reduced. Fluoroscopy was used to confirm component position and leg lengths. At 90 degrees of external rotation and extension, the hip was stable to an anterior directed force.  The wound was copiously irrigated with normal saline solution. Marcaine solution was injected into the periarticular soft tissue. The wound was closed in layers using #1 Vicryl and V-Loc for the fascia, 2-0 Monocryl for the deep dermal layer, and 2-0 nylon mattress sutures for the skin. An Aquacell Ag dressing was applied. The patient was then awakened from anesthesia and transported to the recovery room in stable condition. Sponge, needle, and instrument counts were correct at the end of the case x2. The patient tolerated the procedure well and there were no known complications.  Postoperatively, we  will readmit the patient to the hospitalist service. We will place her on Lovenox for DVT prophylaxis in-house, and discharge her on aspirin. She will receive physical and  occupational therapy. She'll undergo disposition planning. Prior to admission the hospital, she was on home hospice for failure to thrive.

## 2017-02-19 NOTE — Discharge Instructions (Signed)
°Dr. Miniya Miguez °Joint Replacement Specialist °[facility] °[address] °[place], [place] [address] °[phone] ° ° °TOTAL HIP REPLACEMENT POSTOPERATIVE DIRECTIONS ° ° ° °Hip Rehabilitation, Guidelines Following Surgery  ° °WEIGHT BEARING °Weight bearing as tolerated with assist device (walker, cane, etc) as directed, use it as long as suggested by your surgeon or therapist, typically at least 4-6 weeks. ° °The results of a hip operation are greatly improved after range of motion and muscle strengthening exercises. Follow all safety measures which are given to protect your hip. If any of these exercises cause increased pain or swelling in your joint, decrease the amount until you are comfortable again. Then slowly increase the exercises. Call your caregiver if you have problems or questions.  ° °HOME CARE INSTRUCTIONS  °Most of the following instructions are designed to prevent the dislocation of your new hip.  °Remove items at home which could result in a fall. This includes throw rugs or furniture in walking pathways.  °Continue medications as instructed at time of discharge. °· You may have some home medications which will be placed on hold until you complete the course of blood thinner medication. °· You may start showering once you are discharged home. Do not remove your dressing. °Do not put on socks or shoes without following the instructions of your caregivers.   °Sit on chairs with arms. Use the chair arms to help push yourself up when arising.  °Arrange for the use of a toilet seat elevator so you are not sitting low.  °· Walk with walker as instructed.  °You may resume a sexual relationship in one month or when given the OK by your caregiver.  °Use walker as long as suggested by your caregivers.  °You may put full weight on your legs and walk as much as is comfortable. °Avoid periods of inactivity such as sitting longer than an hour when not asleep. This helps prevent  blood clots.  °You may return to work once you are cleared by your surgeon.  °Do not drive a car for 6 weeks or until released by your surgeon.  °Do not drive while taking narcotics.  °Wear elastic stockings for two weeks following surgery during the day but you may remove then at night.  °Make sure you keep all of your appointments after your operation with all of your doctors and caregivers. You should call the office at the above phone number and make an appointment for approximately two weeks after the date of your surgery. °Please pick up a stool softener and laxative for home use as long as you are requiring pain medications. °· ICE to the affected hip every three hours for 30 minutes at a time and then as needed for pain and swelling. Continue to use ice on the hip for pain and swelling from surgery. You may notice swelling that will progress down to the foot and ankle.  This is normal after surgery.  Elevate the leg when you are not up walking on it.   °It is important for you to complete the blood thinner medication as prescribed by your doctor. °· Continue to use the breathing machine which will help keep your temperature down.  It is common for your temperature to cycle up and down following surgery, especially at night when you are not up moving around and exerting yourself.  The breathing machine keeps your lungs expanded and your temperature down. ° °RANGE OF MOTION AND STRENGTHENING EXERCISES  °These exercises are   designed to help you keep full movement of your hip joint. Follow your caregiver's or physical therapist's instructions. Perform all exercises about fifteen times, three times per day or as directed. Exercise both hips, even if you have had only one joint replacement. These exercises can be done on a training (exercise) mat, on the floor, on a table or on a bed. Use whatever works the best and is most comfortable for you. Use music or television while you are exercising so that the exercises  are a pleasant break in your day. This will make your life better with the exercises acting as a break in routine you can look forward to.  °Lying on your back, slowly slide your foot toward your buttocks, raising your knee up off the floor. Then slowly slide your foot back down until your leg is straight again.  °Lying on your back spread your legs as far apart as you can without causing discomfort.  °Lying on your side, raise your upper leg and foot straight up from the floor as far as is comfortable. Slowly lower the leg and repeat.  °Lying on your back, tighten up the muscle in the front of your thigh (quadriceps muscles). You can do this by keeping your leg straight and trying to raise your heel off the floor. This helps strengthen the largest muscle supporting your knee.  °Lying on your back, tighten up the muscles of your buttocks both with the legs straight and with the knee bent at a comfortable angle while keeping your heel on the floor.  ° °SKILLED REHAB INSTRUCTIONS: °If the patient is transferred to a skilled rehab facility following release from the hospital, a list of the current medications will be sent to the facility for the patient to continue.  When discharged from the skilled rehab facility, please have the facility set up the patient's Home Health Physical Therapy prior to being released. Also, the skilled facility will be responsible for providing the patient with their medications at time of release from the facility to include their pain medication and their blood thinner medication. If the patient is still at the rehab facility at time of the two week follow up appointment, the skilled rehab facility will also need to assist the patient in arranging follow up appointment in our office and any transportation needs. ° °MAKE SURE YOU:  °Understand these instructions.  °Will watch your condition.  °Will get help right away if you are not doing well or get worse. ° °Pick up stool softner and  laxative for home use following surgery while on pain medications. °Do not remove your dressing. °The dressing is waterproof--it is OK to take showers. °Continue to use ice for pain and swelling after surgery. °Do not use any lotions or creams on the incision until instructed by your surgeon. °Total Hip Protocol. ° ° °
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
## 2017-02-19 NOTE — Interval H&P Note (Signed)
History and Physical Interval Note:  02/19/2017 12:41 PM  Stacy Mcguire  has presented today for surgery, with the diagnosis of Left femoral neck fracture  The various methods of treatment have been discussed with the patient and family. After consideration of risks, benefits and other options for treatment, the patient has consented to  Procedure(s): ARTHROPLASTY BIPOLAR HIP (HEMIARTHROPLASTY) (Left) as a surgical intervention .  The patient's history has been reviewed, patient examined, no change in status, stable for surgery.  I have reviewed the patient's chart and labs.  Questions were answered to the patient's satisfaction.    The risks, benefits, and alternatives were discussed with the patient. There are risks associated with the surgery including, but not limited to, problems with anesthesia (death), infection, instability (giving out of the joint), dislocation, differences in leg length/angulation/rotation, fracture of bones, loosening or failure of implants, hematoma (blood accumulation) which may require surgical drainage, blood clots, pulmonary embolism, nerve injury (foot drop and lateral thigh numbness), and blood vessel injury. The patient understands these risks and elects to proceed.   Luvia Orzechowski, Horald Pollen

## 2017-02-19 NOTE — Progress Notes (Signed)
Vitals taken on arrival to 5n03
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
## 2017-02-19 NOTE — Anesthesia Preprocedure Evaluation (Addendum)
Anesthesia Evaluation  Patient identified by MRN, date of birth, ID band Patient awake    Reviewed: Allergy & Precautions, NPO status , Patient's Chart, lab work & pertinent test results  Airway Mallampati: I  TM Distance: >3 FB Neck ROM: Full    Dental no notable dental hx.    Pulmonary neg pulmonary ROS,    Pulmonary exam normal breath sounds clear to auscultation       Cardiovascular +CHF  Normal cardiovascular exam Rhythm:Regular Rate:Normal  ECHO 9/23 - EF 20%   Neuro/Psych Depression negative neurological ROS  negative psych ROS   GI/Hepatic GERD  Medicated and Controlled,  Endo/Other  negative endocrine ROS  Renal/GU Renal InsufficiencyRenal disease  negative genitourinary   Musculoskeletal negative musculoskeletal ROS (+)   Abdominal   Peds negative pediatric ROS (+)  Hematology negative hematology ROS (+)   Anesthesia Other Findings   Reproductive/Obstetrics negative OB ROS                           Anesthesia Physical Anesthesia Plan  ASA: III  Anesthesia Plan: General   Post-op Pain Management:    Induction: Intravenous  PONV Risk Score and Plan: 3 and Ondansetron, Dexamethasone and Midazolam  Airway Management Planned: Oral ETT  Additional Equipment:   Intra-op Plan:   Post-operative Plan: Extubation in OR  Informed Consent: I have reviewed the patients History and Physical, chart, labs and discussed the procedure including the risks, benefits and alternatives for the proposed anesthesia with the patient or authorized representative who has indicated his/her understanding and acceptance.   Dental advisory given  Plan Discussed with: CRNA and Surgeon  Anesthesia Plan Comments:        Anesthesia Quick Evaluation

## 2017-02-19 NOTE — Progress Notes (Signed)
Patient takes her medication crushed in applesauce.  Unable to administer medications to patient as she is NPO for surgery.  Unable to administer metoprolol XL because medication is extended release; unable to crush it per protocol.  Report given to [facility].  Patient ready and stable for transfer at this time.
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
## 2017-02-20 ENCOUNTER — Inpatient Hospital Stay (HOSPITAL_COMMUNITY)

## 2017-02-20 DIAGNOSIS — S72002D Fracture of unspecified part of neck of left femur, subsequent encounter for closed fracture with routine healing: Secondary | ICD-10-CM

## 2017-02-20 DIAGNOSIS — N179 Acute kidney failure, unspecified: Secondary | ICD-10-CM

## 2017-02-20 DIAGNOSIS — E43 Unspecified severe protein-calorie malnutrition: Secondary | ICD-10-CM

## 2017-02-20 LAB — BASIC METABOLIC PANEL
ANION GAP: 8 (ref 5–15)
BUN: 16 mg/dL (ref 6–20)
CO2: 20 mmol/L — ABNORMAL LOW (ref 22–32)
Calcium: 8.7 mg/dL — ABNORMAL LOW (ref 8.9–10.3)
Chloride: 107 mmol/L (ref 101–111)
Creatinine, Ser: 0.74 mg/dL (ref 0.44–1.00)
GFR calc Af Amer: >60 mL/min (ref >60)
GFR calc non Af Amer: >60 mL/min (ref >60)
Glucose, Bld: 97 mg/dL (ref 65–99)
POTASSIUM: 3.7 mmol/L (ref 3.5–5.1)
Sodium: 135 mmol/L (ref 135–145)

## 2017-02-20 LAB — URINE CULTURE

## 2017-02-20 LAB — CBC
HCT: 32.3 % — ABNORMAL LOW (ref 36.0–46.0)
HEMOGLOBIN: 10.7 g/dL — AB (ref 12.0–15.0)
MCH: 30.9 pg (ref 26.0–34.0)
MCHC: 33.1 g/dL (ref 30.0–36.0)
MCV: 93.4 fL (ref 78.0–100.0)
Platelets: 260 10*3/uL (ref 150–400)
RBC: 3.46 MIL/uL — ABNORMAL LOW (ref 3.87–5.11)
RDW: 18.6 % — ABNORMAL HIGH (ref 11.5–15.5)
WBC: 16.7 10*3/uL — AB (ref 4.0–10.5)

## 2017-02-20 LAB — GLUCOSE, CAPILLARY
GLUCOSE-CAPILLARY: 73 mg/dL (ref 65–99)
Glucose-Capillary: 69 mg/dL (ref 65–99)

## 2017-02-20 MED ORDER — HYDROCODONE-ACETAMINOPHEN 5-325 MG PO TABS: 1.0000 | ORAL_TABLET | Freq: Four times a day (QID) | ORAL | 0 refills | Status: DC | PRN

## 2017-02-20 MED ORDER — ASPIRIN 81 MG PO TABS: 81.0000 mg | ORAL_TABLET | Freq: Two times a day (BID) | ORAL | 1 refills | Status: DC

## 2017-02-20 MED ORDER — CEPHALEXIN 500 MG PO CAPS: 500.0000 mg | ORAL_CAPSULE | Freq: Two times a day (BID) | ORAL | Status: DC

## 2017-02-20 MED ORDER — LOSARTAN POTASSIUM 50 MG PO TABS
50.0000 mg | ORAL_TABLET | Freq: Every day | ORAL | Status: DC
  Administered 2017-02-21 – 2017-02-24 (×4): 50 mg via ORAL
  Filled 2017-02-20 (×4): qty 1

## 2017-02-20 NOTE — Anesthesia Postprocedure Evaluation (Signed)
Anesthesia Post Note  Patient: MASSIE COGLIANO  Procedure(s) Performed: Procedure(s) (LRB): ARTHROPLASTY BIPOLAR HIP (HEMIARTHROPLASTY) (Left)     Patient location during evaluation: PACU Anesthesia Type: General Level of consciousness: awake and alert Pain management: pain level controlled Vital Signs Assessment: post-procedure vital signs reviewed and stable Respiratory status: spontaneous breathing, nonlabored ventilation, respiratory function stable and patient connected to nasal cannula oxygen Cardiovascular status: blood pressure returned to baseline and stable Postop Assessment: no apparent nausea or vomiting Anesthetic complications: no    Last Vitals:  Vitals:   02/19/17 2030 02/20/17 0527  BP: (!) 144/82 130/64  Pulse: 88 91  Resp: 14 15  Temp: 36.7 C 36.5 C  SpO2: 97% 98%    Last Pain:  Vitals:   02/20/17 0527  TempSrc: Axillary  PainSc:                  Arrie Borrelli S

## 2017-02-20 NOTE — Progress Notes (Signed)
PT Cancellation Note  Patient Details Name: Stacy Mcguire MRN: [id number] DOB: 1927/12/26   Cancelled Treatment:    Reason Eval/Treat Not Completed: Patient at procedure or test/unavailable. Pt off the floor at [facility]. PT to return as able.   [name] 02/20/2017, 10:05 AM   [name], PT, DPT Pager #: [phone] Office #: [phone]

## 2017-02-20 NOTE — Progress Notes (Signed)
Progress Note  Patient Name: RUFUS CYPERT Date of Encounter: 02/20/2017  Primary Cardiologist:   Dr. Claiborne Billings  Subjective   Very confused.  She is not reporting chest pain or SOB.   Inpatient Medications    Scheduled Meds: . DULoxetine  30 mg Oral Daily  . enoxaparin (LOVENOX) injection  30 mg Subcutaneous Q24H  . feeding supplement  1 Container Oral BID BM  . losartan  25 mg Oral Daily  . mesalamine  2.4 g Oral BID  . metoprolol succinate  50 mg Oral Daily  . polyethylene glycol  17 g Oral Daily  . senna-docusate  1 tablet Oral Daily   Continuous Infusions: . cefTRIAXone (ROCEPHIN)  IV Stopped (02/18/17 1855)   PRN Meds: acetaminophen **OR** acetaminophen, HYDROcodone-acetaminophen, HYDROmorphone (DILAUDID) injection, menthol-cetylpyridinium **OR** phenol, metoCLOPramide **OR** metoCLOPramide (REGLAN) injection, morphine injection, ondansetron **OR** ondansetron (ZOFRAN) IV, RESOURCE THICKENUP CLEAR, traZODone   Vital Signs    Vitals:   02/19/17 2009 02/19/17 2019 02/19/17 2030 02/20/17 0527  BP: (!) 143/70 (!) 143/70 (!) 144/82 130/64  Pulse: 82 81 88 91  Resp: 15 16 14 15   Temp:  98 F (36.7 C) 98 F (36.7 C) 97.7 F (36.5 C)  TempSrc:    Axillary  SpO2: 100% 100% 97% 98%  Weight:      Height:        Intake/Output Summary (Last 24 hours) at 02/20/17 1018 Last data filed at 02/20/17 0527  Gross per 24 hour  Intake             1100 ml  Output              900 ml  Net              200 ml   Filed Weights   02/17/17 2228  Weight: 110 lb 10.7 oz (50.2 kg)    Telemetry    NSR, PACs  - Personally Reviewed  ECG    NA - Personally Reviewed  Physical Exam   GEN: No acute distress.   Neck: No  JVD Cardiac: RRR, no murmurs, rubs, or gallops.  Respiratory: Clear  to auscultation bilaterally. GI: Soft, nontender, non-distended  MS: No  edema; No deformity. Neuro:  Nonfocal  Psych: Normal affect   Labs    Chemistry Recent Labs Lab 02/17/17 1337   02/18/17 0957 02/19/17 0433 02/19/17 2157 02/20/17 0710  NA 132*  < > 137 139  --  135  K 4.4  < > 3.8 3.6  --  3.7  CL 102  < > 109 109  --  107  CO2 16*  < > 20* 19*  --  20*  GLUCOSE 177*  < > 103* 96  --  97  BUN 32*  < > 31* 23*  --  16  CREATININE 1.21*  < > 0.82 0.65 0.67 0.74  CALCIUM 9.5  < > 9.0 9.2  --  8.7*  PROT 7.2  --  6.0* 6.4*  --   --   ALBUMIN 3.9  --  3.1* 3.2*  --   --   AST 23  --  17 17  --   --   ALT 24  --  19 17  --   --   ALKPHOS 70  --  58 66  --   --   BILITOT 1.4*  --  1.0 0.6  --   --   GFRNONAA 39*  < > >60 >60 >60 >60  GFRAA 46*  < > >60 >60 >60 >60  ANIONGAP 14  < > 8 11  --  8  < > = values in this interval not displayed.   Hematology Recent Labs Lab 02/19/17 0433 02/19/17 2157 02/20/17 0710  WBC 26.5* 16.5* 16.7*  RBC 3.95 3.08* 3.46*  HGB 11.8* 9.2* 10.7*  HCT 36.4 28.8* 32.3*  MCV 92.2 93.5 93.4  MCH 29.9 29.9 30.9  MCHC 32.4 31.9 33.1  RDW 18.6* 18.3* 18.6*  PLT 294 249 260    Cardiac Enzymes Recent Labs Lab 02/17/17 1337 02/17/17 1905 02/18/17 0010 02/18/17 0655  TROPONINI 0.08* 0.09* 0.07* 0.07*   No results for input(s): TROPIPOC in the last 168 hours.   BNPNo results for input(s): BNP, PROBNP in the last 168 hours.   DDimer No results for input(s): DDIMER in the last 168 hours.   Radiology    Ct Abdomen Pelvis W Contrast  Result Date: 02/19/2017 CLINICAL DATA:  History of ulcerative colitis, abdominal pain. Chronic kidney disease. EXAM: CT ABDOMEN AND PELVIS WITH CONTRAST TECHNIQUE: Multidetector CT imaging of the abdomen and pelvis was performed using the standard protocol following bolus administration of intravenous contrast. CONTRAST:  93mL ISOVUE-300 IOPAMIDOL (ISOVUE-300) INJECTION 61% COMPARISON:  12/11/2016 FINDINGS: Lower chest: Large hiatal hernia. Small left pleural effusion. Bibasilar atelectasis. Elevation of the right hemidiaphragm. Hepatobiliary: Stable cyst in the left hepatic lobe with peripheral  calcifications, measuring up to 8 cm. Gallbladder is moderately distended. Common bile duct is mildly prominent at 9 mm. Pancreas: No focal abnormality or ductal dilatation. Spleen: No focal abnormality.  Normal size. Adrenals/Urinary Tract: Small cysts in the kidneys bilaterally. No hydronephrosis. Adrenal glands are unremarkable. Urinary bladder unremarkable. Foley catheter present in the bladder. Stomach/Bowel: Moderate stool throughout the colon. Scattered sigmoid diverticula. No active diverticulitis. Vascular/Lymphatic: Aortic and iliac calcifications. No aneurysm or adenopathy. Reproductive: Prior hysterectomy.  No adnexal masses. Other: No free fluid or free air. There is stranding within the perirectal fat and presacral soft tissues which is new since prior study and nonspecific. I see no underlying bowel wall abnormality. Musculoskeletal: Scoliosis and degenerative changes in the lumbar spine. IMPRESSION: Mild distention of the gallbladder and prominence of the common bile duct as above. This could be further evaluated with right upper quadrant ultrasound. Moderate-sized hiatal hernia. Small left pleural effusion, bibasilar atelectasis. Stranding/ fluid in the presacral space and perirectal soft tissues without visible underlying bowel wall abnormality. This is nonspecific. No evidence for enteritis/colitis. Moderate stool burden in the colon. Electronically Signed   By: Rolm Baptise M.D.   On: 02/19/2017 10:05   Pelvis Portable  Result Date: 02/19/2017 CLINICAL DATA:  Postop left total hip arthroplasty. EXAM: PORTABLE PELVIS 1-2 VIEWS COMPARISON:  Intraoperative radiograph same date. Pelvic CT 02/19/2017. FINDINGS: Portable AP view of the lower pelvis at 1756 hours. Patient is status post left hip hemiarthroplasty. The hardware is well positioned. No evidence of acute fracture or dislocation. Possible bold pubic rami fractures on the right. There is gas within the soft tissue surrounding the left hip.  IMPRESSION: No demonstrated complication following left hip hemiarthroplasty. Electronically Signed   By: Richardean Sale M.D.   On: 02/19/2017 18:17   Dg C-arm 1-60 Min  Result Date: 02/19/2017 CLINICAL DATA:  Left hip hemiarthroplasty for fracture. EXAM: OPERATIVE LEFT HIP (WITH PELVIS IF PERFORMED) 4 VIEWS TECHNIQUE: Fluoroscopic spot image(s) were submitted for interpretation post-operatively. COMPARISON:  CT 02/19/2017.  Radiographs 02/17/2017. FINDINGS: Sequential spot images of the left hip demonstrate  the placement of a femoral sizer and subsequent placement of a bipolar hemiarthroplasty. The hardware appears well positioned. No evidence of acute fracture or dislocation. IMPRESSION: Intraoperative views during left hip hemiarthroplasty. No demonstrated complication. Electronically Signed   By: Richardean Sale M.D.   On: 02/19/2017 18:19   Dg Hip Operative Unilat W Or W/o Pelvis Left  Result Date: 02/19/2017 CLINICAL DATA:  Left hip hemiarthroplasty for fracture. EXAM: OPERATIVE LEFT HIP (WITH PELVIS IF PERFORMED) 4 VIEWS TECHNIQUE: Fluoroscopic spot image(s) were submitted for interpretation post-operatively. COMPARISON:  CT 02/19/2017.  Radiographs 02/17/2017. FINDINGS: Sequential spot images of the left hip demonstrate the placement of a femoral sizer and subsequent placement of a bipolar hemiarthroplasty. The hardware appears well positioned. No evidence of acute fracture or dislocation. IMPRESSION: Intraoperative views during left hip hemiarthroplasty. No demonstrated complication. Electronically Signed   By: Richardean Sale M.D.   On: 02/19/2017 18:19    Cardiac Studies   ECHO:  Study Conclusions  - Left ventricle: LVEF is severely depressed at approximately 20%   with severe hypokinesis/akinessi of distal lateral, septal,   inferior, distal anterior and apical walls. The cavity size was   normal. Wall thickness was increased in a pattern of mild LVH. - Aortic valve: There was mild  regurgitation. - Mitral valve: There was mild regurgitation. - Left atrium: The atrium was mildly dilated. - Right ventricle: Systolic function was mildly reduced. - Pulmonary arteries: PA peak pressure: 70 mm Hg (S).   Patient Profile     83 y.o. female  with a hx of ulcerative colitis, hypertension, paroxysmal atrial fibrillation, depression,  who is being seen for the evaluation of acute systolic and diastolic heart failure and pre-operative risk assessment at the request of Dr. Florene Glen.  EF was found to be 20% this admission which was new.    Assessment & Plan    CHRONIC SYSTOLIC HF:   Reduced EF is a new finding but she is not a candidate for invasive or non invasive work up.  We are planning medical management only.  I will increase the Cozaar to 50 mg daily.  Continue current dose of beta blocker.     ELEVATED TROPONIN:  Medical management.       Signed, Minus Breeding, MD  02/20/2017, 10:18 AM

## 2017-02-20 NOTE — Progress Notes (Signed)
Modified Barium Swallow Progress Note  Patient Details  Name: Stacy Mcguire MRN: [id number] Date of Birth: 1927/06/17  Today's Date: 02/20/2017  Modified Barium Swallow completed.  Full report located under Chart Review in the Imaging Section.  Brief recommendations include the following:  Clinical Impression  Pt demonstrates a moderate to severe dysphagia with both oral and ororpharyngeal motor and sensory deficits. Oral formation and propulsion of bolus is slow and incomplete with lingual and base of tongue residuals post swallow that spill to vallecula and pyriforms. Laryngeal elevation is adequate, but base of tongue weakness and decreased hyoid excursion (which may also be impacted by curvature of spine and structural impediment to swallow function) lead to vallecular residuals that again spill into the pyriforms post swallow with all textures. Pt silently aspirated thin liquids during the swallow due to incomplete laryngeal closure and cannot expectorate even with max cues. There is a high risk of aspiration with all textures given poor airway protection with liquids, severe residuals that pt cannot clear which will likely be aspirated post swallow and impaired cognition and strength to complete compensatory strategies. Best diet for comfort would be mechanical soft with thin liquids as there is no benefit to thickening liquids as it will not reduce risk of aspiration. Will need to discuss plan of care with physician and family if possible.    Swallow Evaluation Recommendations       SLP Diet Recommendations: Dysphagia 3 (Mech soft) solids;Thin liquid (with known risk of aspiration)   Liquid Administration via: Cup   Medication Administration: Whole meds with liquid   Supervision: Staff to assist with self feeding   Compensations: Slow rate;Small sips/bites   Postural Changes: Remain semi-upright after after feeds/meals (Comment)   Oral Care Recommendations: Oral care QID        Herbie Baltimore, MA CCC-SLP [phone]  Joshuwa Vecchio, Katherene Ponto 02/20/2017,1:35 PM

## 2017-02-20 NOTE — Progress Notes (Signed)
   Subjective:  Patient reports pain as mild.  No c/o.  Objective:   VITALS:   Vitals:   02/19/17 2009 02/19/17 2019 02/19/17 2030 02/20/17 0527  BP: (!) 143/70 (!) 143/70 (!) 144/82 130/64  Pulse: 82 81 88 91  Resp: 15 16 14 15   Temp:  98 F (36.7 C) 98 F (36.7 C) 97.7 F (36.5 C)  TempSrc:    Axillary  SpO2: 100% 100% 97% 98%  Weight:      Height:       NAD Pleasantly confused Sensation intact distally Intact pulses distally Dorsiflexion/Plantar flexion intact Incision: dressing C/D/I Compartment soft   Lab Results  Component Value Date   WBC 16.5 (H) 02/19/2017   HGB 9.2 (L) 02/19/2017   HCT 28.8 (L) 02/19/2017   MCV 93.5 02/19/2017   PLT 249 02/19/2017   BMET    Component Value Date/Time   NA 139 02/19/2017 0433   K 3.6 02/19/2017 0433   CL 109 02/19/2017 0433   CO2 19 (L) 02/19/2017 0433   GLUCOSE 96 02/19/2017 0433   BUN 23 (H) 02/19/2017 0433   CREATININE 0.67 02/19/2017 2157   CALCIUM 9.2 02/19/2017 0433   GFRNONAA >60 02/19/2017 2157   GFRAA >60 02/19/2017 2157     Assessment/Plan: 1 Day Post-Op   Principal Problem:   Closed left hip fracture (HCC) Active Problems:   Ulcerative colitis (HCC)   Weakness generalized   Acute renal failure (HCC)   Protein-calorie malnutrition, severe (HCC)   MDD (major depressive disorder), recurrent severe, without psychosis ([place])   Acute combined systolic and diastolic heart failure ([place])   Displaced fracture of left femoral neck ([place])   WBAT with walker DVT ppx: Lovenox in house --> home on ASA, SCDs, TEDs PO pain control PT/OT Dispo: D/C planning   Jeraldean Wechter, Horald Pollen 02/20/2017, 7:55 AM   Rod Can, MD Cell [phone]
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
## 2017-02-20 NOTE — Progress Notes (Signed)
Hypoglycemic Event  CBG: 69  Treatment: 15 GM carbohydrate snack  Symptoms: None  Follow-up CBG: Time:0700  CBG Result:72  Possible Reasons for Event: Inadequate meal intake  Comments/MD notified:Schorr     [place] R Jawon Dipiero
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
## 2017-02-20 NOTE — Progress Notes (Signed)
0810--[facility]--GIP RN Visit  This is a related and covered GIP admission of 02/17/2017 with [facility] diagnosis of Protein Calorie Malnutrition per Dr. Konrad Dolores. Patient code status: DNR. Family called EMS after finding patient had fallen and had been down several hours. Hospice was notified. Admitted to hospital for fractured left hip.  Visited patient in room, with RN and nurse tech at bedside. Patient is mildly but pleasantly confused. Oriented to person and place. Patient denies any pain. Per RN, concern for patient safety if plan is for her to return home, discussed plans as per [facility] CSW note. No family is present at this time.  Pt is not receiving any continuous medications at this time. Ceftriaxone 1 Gm is being administered Q 24 hours. Pt has not received anything for pain since returning to floor from surgery. Foley has been discontinued and is expected to void by 1230 pm today.  [facility] will continue to follow daily while hospitalized and anticipate any discharge needs.   Please call with any hospice related questions or concerns.  Thank you, Margaretmary Eddy, RN, [facility] Liaison [phone]  Kay are on AMION.

## 2017-02-21 DIAGNOSIS — E876 Hypokalemia: Secondary | ICD-10-CM

## 2017-02-21 DIAGNOSIS — R531 Weakness: Secondary | ICD-10-CM

## 2017-02-21 LAB — BASIC METABOLIC PANEL
Anion gap: 10 (ref 5–15)
BUN: 19 mg/dL (ref 6–20)
CHLORIDE: 101 mmol/L (ref 101–111)
CO2: 21 mmol/L — ABNORMAL LOW (ref 22–32)
Calcium: 8.1 mg/dL — ABNORMAL LOW (ref 8.9–10.3)
Creatinine, Ser: 0.71 mg/dL (ref 0.44–1.00)
GFR calc Af Amer: >60 mL/min (ref >60)
GFR calc non Af Amer: >60 mL/min (ref >60)
GLUCOSE: 98 mg/dL (ref 65–99)
POTASSIUM: 2.9 mmol/L — AB (ref 3.5–5.1)
Sodium: 132 mmol/L — ABNORMAL LOW (ref 135–145)

## 2017-02-21 LAB — MAGNESIUM: Magnesium: 1.5 mg/dL — ABNORMAL LOW (ref 1.7–2.4)

## 2017-02-21 LAB — CBC
HEMATOCRIT: 28.6 % — AB (ref 36.0–46.0)
HEMOGLOBIN: 9.2 g/dL — AB (ref 12.0–15.0)
MCH: 29.9 pg (ref 26.0–34.0)
MCHC: 32.2 g/dL (ref 30.0–36.0)
MCV: 92.9 fL (ref 78.0–100.0)
Platelets: 262 10*3/uL (ref 150–400)
RBC: 3.08 MIL/uL — ABNORMAL LOW (ref 3.87–5.11)
RDW: 18.1 % — ABNORMAL HIGH (ref 11.5–15.5)
WBC: 12.9 10*3/uL — ABNORMAL HIGH (ref 4.0–10.5)

## 2017-02-21 LAB — GLUCOSE, CAPILLARY: Glucose-Capillary: 96 mg/dL (ref 65–99)

## 2017-02-21 MED ORDER — ADULT MULTIVITAMIN LIQUID CH
15.0000 mL | Freq: Every day | ORAL | Status: DC
  Administered 2017-02-22 – 2017-02-24 (×3): 15 mL via ORAL
  Filled 2017-02-21 (×4): qty 15

## 2017-02-21 MED ORDER — POTASSIUM CHLORIDE 10 MEQ/100ML IV SOLN
10.0000 meq | INTRAVENOUS | Status: AC
  Administered 2017-02-21 (×4): 10 meq via INTRAVENOUS
  Filled 2017-02-21 (×4): qty 100

## 2017-02-21 MED ORDER — POTASSIUM CHLORIDE CRYS ER 20 MEQ PO TBCR
40.0000 meq | EXTENDED_RELEASE_TABLET | Freq: Every day | ORAL | Status: DC
  Administered 2017-02-21 – 2017-02-24 (×4): 40 meq via ORAL
  Filled 2017-02-21 (×4): qty 2

## 2017-02-21 MED ORDER — ENSURE ENLIVE PO LIQD
237.0000 mL | Freq: Three times a day (TID) | ORAL | Status: DC
  Administered 2017-02-21 – 2017-02-24 (×9): 237 mL via ORAL

## 2017-02-21 NOTE — Progress Notes (Signed)
   Subjective:  Patient reports pain as mild.  No c/o.  Objective:   VITALS:   Vitals:   02/20/17 1500 02/20/17 2200 02/21/17 0604 02/21/17 0920  BP: 132/62 (!) 141/65 (!) 152/66 (!) 152/50  Pulse: 90 81 91 94  Resp: 16     Temp: 97.7 F (36.5 C) 98.8 F (37.1 C) 97.9 F (36.6 C)   TempSrc: Axillary Axillary Axillary   SpO2: 98%  95%   Weight:      Height:       NAD Pleasantly confused Sensation intact distally Intact pulses distally Dorsiflexion/Plantar flexion intact Incision: scant drainage Compartment soft   Lab Results  Component Value Date   WBC 12.9 (H) 02/21/2017   HGB 9.2 (L) 02/21/2017   HCT 28.6 (L) 02/21/2017   MCV 92.9 02/21/2017   PLT 262 02/21/2017   BMET    Component Value Date/Time   NA 132 (L) 02/21/2017 0444   K 2.9 (L) 02/21/2017 0444   CL 101 02/21/2017 0444   CO2 21 (L) 02/21/2017 0444   GLUCOSE 98 02/21/2017 0444   BUN 19 02/21/2017 0444   CREATININE 0.71 02/21/2017 0444   CALCIUM 8.1 (L) 02/21/2017 0444   GFRNONAA >60 02/21/2017 0444   GFRAA >60 02/21/2017 0444     Assessment/Plan: 2 Days Post-Op   Principal Problem:   Closed left hip fracture (HCC) Active Problems:   Ulcerative colitis (HCC)   Weakness generalized   Acute renal failure (HCC)   Protein-calorie malnutrition, severe (HCC)   MDD (major depressive disorder), recurrent severe, without psychosis ([place])   Acute combined systolic and diastolic heart failure (HCC)   Displaced fracture of left femoral neck (HCC)   WBAT with walker DVT ppx: Lovenox in house --> home on ASA, SCDs, TEDs PO pain control PT/OT Dispo: D/C planning, please place fresh Aquacel just prior to d/c   Dianne Whelchel, Horald Pollen 02/21/2017, 2:38 PM   Rod Can, MD Cell [phone]
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
## 2017-02-21 NOTE — Clinical Social Work Note (Signed)
Clinical Social Work Assessment  Patient Details  Name: Stacy Mcguire MRN: [id number] Date of Birth: 1927-06-26  Date of referral:  02/21/17               Reason for consult:  Facility Placement                Permission sought to share information with:  Facility Art therapist granted to share information::  Yes, Verbal Permission Granted  Name::     son  Agency::  SNF  Relationship::     Contact Information:     Housing/Transportation Living arrangements for the past 2 months:  Single Family Home Source of Information:  Adult Children Patient Interpreter Needed:  None Criminal Activity/Legal Involvement Pertinent to Current Situation/Hospitalization:  No - Comment as needed Significant Relationships:  Adult Children, Other Family Members Lives with:  Adult Children Do you feel safe going back to the place where you live?  No Need for family participation in patient care:  Yes (Comment)  Care giving concerns:  Patient from home with home hospice following. Pt not appropriate at this time for hospice in patient and patient not able to return home with son due to patient safety. Pt has tried to harm self in the past. Pt will need SNF at discharge. CSW spoke with son and daughter n law and they are in agreement. CSW will f/u for [facility] or [facility]. CSW discussed insurance auth as well. Family in agreement.  Social Worker assessment / plan:  CSW discussed SNF process with family at length. CSW will assist with transition to SNF. CSW explained and answered questions. Pt will be transported to SNF at appropriate time. CSW faxed off for insurance auth and will f/u for same.  Employment status:  Retired Nurse, adult PT Recommendations:  [place] / Referral to community resources:  [place]  Patient/Family's Response to care:  Family appreciative of CSW assistance with SNF placement. No  issues or concerns at this time.  Patient/Family's Understanding of and Emotional Response to Diagnosis, Current Treatment, and Prognosis:  Family has good understanding of diagnosis, current treatment and prognosis. They are hopeful that she will get good care at SNF in her last days. No further needs at this time.  Emotional Assessment Appearance:  Appears stated age Attitude/Demeanor/Rapport:   (Cooperative) Affect (typically observed):  Accepting, Appropriate Orientation:  Oriented to Self, Oriented to Place Alcohol / Substance use:  Not Applicable Psych involvement (Current and /or in the community):  No (Comment)  Discharge Needs  Concerns to be addressed:  Care Coordination Readmission within the last 30 days:  No Current discharge risk:  Chronically ill Barriers to Discharge:  No Barriers Identified   Normajean Baxter, LCSW 02/21/2017, 1:01 PM

## 2017-02-21 NOTE — Progress Notes (Signed)
Orthopedic Tech Progress Note Patient Details:  Stacy Mcguire June 29, 1927 [id number]  Patient ID: Stacy Mcguire, female   DOB: 01/18/1928, 83 y.o.   MRN: [id number]   Stacy Mcguire 02/21/2017, 3:17 PM Pt unable to use trapeze bar patient helper; RN notified
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
## 2017-02-21 NOTE — Social Work (Signed)
CSW discussed plan for SNF with [facility] and they will suspend home health services.  CSW discussed plan with family as well. The family interested in [facility], [facility] and are open to SNF's in [place] or [place] area as well.  CSW will f/u on bed offers.  Elissa Hefty, LCSW Clinical Social Worker [phone]

## 2017-02-22 ENCOUNTER — Encounter (HOSPITAL_COMMUNITY): Payer: Self-pay | Admitting: General Practice

## 2017-02-22 LAB — CBC
HCT: 28.6 % — ABNORMAL LOW (ref 36.0–46.0)
Hemoglobin: 9.1 g/dL — ABNORMAL LOW (ref 12.0–15.0)
MCH: 29.4 pg (ref 26.0–34.0)
MCHC: 31.8 g/dL (ref 30.0–36.0)
MCV: 92.6 fL (ref 78.0–100.0)
Platelets: 294 10*3/uL (ref 150–400)
RBC: 3.09 MIL/uL — AB (ref 3.87–5.11)
RDW: 17.7 % — AB (ref 11.5–15.5)
WBC: 14.2 10*3/uL — AB (ref 4.0–10.5)

## 2017-02-22 LAB — BASIC METABOLIC PANEL
ANION GAP: 7 (ref 5–15)
BUN: 9 mg/dL (ref 6–20)
CALCIUM: 8.5 mg/dL — AB (ref 8.9–10.3)
CO2: 24 mmol/L (ref 22–32)
Chloride: 103 mmol/L (ref 101–111)
Creatinine, Ser: 0.56 mg/dL (ref 0.44–1.00)
GFR calc Af Amer: >60 mL/min (ref >60)
GFR calc non Af Amer: >60 mL/min (ref >60)
Glucose, Bld: 105 mg/dL — ABNORMAL HIGH (ref 65–99)
POTASSIUM: 3.8 mmol/L (ref 3.5–5.1)
SODIUM: 134 mmol/L — AB (ref 135–145)

## 2017-02-22 LAB — GLUCOSE, CAPILLARY: GLUCOSE-CAPILLARY: 119 mg/dL — AB (ref 65–99)

## 2017-02-22 MED ORDER — MAGNESIUM SULFATE 2 GM/50ML IV SOLN
2.0000 g | Freq: Once | INTRAVENOUS | Status: AC
  Administered 2017-02-22: 2 g via INTRAVENOUS
  Filled 2017-02-22: qty 50

## 2017-02-22 MED ORDER — SODIUM CHLORIDE 0.9% FLUSH: 10.0000 mL | INTRAVENOUS | Status: DC | PRN

## 2017-02-22 MED ORDER — DEXTROSE 5 % IV SOLN
1.0000 g | INTRAVENOUS | Status: AC
  Administered 2017-02-22: 1 g via INTRAVENOUS
  Filled 2017-02-22: qty 10

## 2017-02-22 NOTE — Progress Notes (Signed)
Pt has been unable to void today after being I&O cathed at 6am this morning. Pt sat on BSC and was still unable. Bladder scan showed 369 cc urine, I&O cath done at 1745 per order with 450 cc amber urine returned.

## 2017-02-22 NOTE — Plan of Care (Signed)
Problem: Pain Managment: Goal: General experience of comfort will improve Patient voices understanding of pain scale and calls for pain medication when needed   
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
## 2017-02-22 NOTE — Progress Notes (Signed)
[facility] MSW: This is a hospice related admission. Pt is a DNR. No family present. Pt was awake and alert. MSW asked pt about hospital discharge plans. Pt indicated that she was going to rehab. Pt did stated that she had to have a bowel movement. MSW informed Andi Hence, staff who asked CNA to assist. MSW left room as pt stated that she really needed to go. MSW spoke with Andi Hence, staff RN who reported that pt was only eating about 25% of her meals. MSW spoke with Mardene Celeste, hospital social worker to finalize hospital discharge plans. Pt is scheduled to be transfer to [facility] on Saturday. Family plans to revoke hpcg services effective when pt is discharged from hospital and transfers to SNF in order for medicare to be billed.   Marilynne Halsted, MSW [phone]
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
## 2017-02-22 NOTE — Social Work (Signed)
CSW discussed  Bed offers with family. Family accepted bed offer from [facility].   CSW faxed clinicals to [facility] for [employer].  SNF will have a bed on Saturday. Plan for Saturday discharge.  Elissa Hefty, LCSW Clinical Social Worker [phone]

## 2017-02-22 NOTE — Consult Note (Signed)
ORTHOPAEDIC CONSULTATION  REQUESTING PHYSICIAN: Rosita Fire, MD  PCP:  Leighton Ruff, MD  Chief Complaint: left hip fracture.  HPI: Stacy Mcguire is a 83 y.o. female who complains of  Left hip pain. She was found down by her family over the weekend, and she was unable to weight-bear. She was taken to the hospital, where x-rays revealed a displaced left femoral neck fracture. The patient has been a minimal also ambulated for about the past 6 months. She is on home hospice due to severe depression with suicidal ideation and failure to thrive/poor by mouth intake. I was asked to assume care by Dr. Doreatha Martin for her left hip fracture.  Past Medical History:  Diagnosis Date  . Anemia, unspecified   . Anorexia   . Arthritis   . Depression   . Diverticulosis 2010   Colonoscopy  . Failure to thrive in adult   . GERD (gastroesophageal reflux disease)   . Hiatal hernia 2010   EGD   . Hot flashes   . Internal hemorrhoids 2010   Colonoscopy  . Kidney disease, chronic, stage III (moderate, EGFR 30-59 ml/min)   . Migraine   . Osteoarthritis   . Shingles   . Ulcerative (chronic) proctitis ([place]) 2005   Colonoscopy   . UTI (urinary tract infection)    Past Surgical History:  Procedure Laterality Date  . ABDOMINAL HYSTERECTOMY    . HEMIARTHROPLASTY HIP Left 02/19/2017  . HIP ARTHROPLASTY Left 02/19/2017   Procedure: ARTHROPLASTY BIPOLAR HIP (HEMIARTHROPLASTY);  Surgeon: Rod Can, MD;  Location: [place];  Service: Orthopedics;  Laterality: Left;  . KNEE SURGERY    . MASTOIDECTOMY    . NOSE SURGERY    . TONSILLECTOMY     Social History   Social History  . Marital status: Widowed    Spouse name: N/A  . Number of children: N/A  . Years of education: N/A   Social History Main Topics  . Smoking status: Never Smoker  . Smokeless tobacco: Never Used  . Alcohol use No  . Drug use: No  . Sexual activity: Not Currently   Other Topics Concern  . None   Social  History Narrative   ** Merged History Encounter **        Diet:   Do you drink/ eat things with caffeine?  Yes Chocolate, coffee  Marital status: Divorced, Widowed                              What year were you married ? [phone]  Do you live in a house, apartment,assistred living, condo, trailer,    etc.)? Townhouse  Is it one or more stories? 1    How many persons live in your home ? 1  Do you have any pets in your home ?(please list) Yes, Dog  Current or past profession: Editor, commissioning  Do you exercise? some                                Type & how often: house work, yard work  Do you have a living will? Yes  Do you have a DNR form?  Yes                     If not, do you want to discuss one?   Do you have signed POA?HPOA forms?  Yes              If so, please bring to your       appointment     Family History  Problem Relation Age of Onset  . Diabetes Mother   . Hypertension Father   . Breast cancer Mother   . Stroke Mother 45  . Breast cancer Sister 22  . Diabetes Son 8  . Prostate cancer Son   . Colon cancer Neg Hx    Allergies  Allergen Reactions  . Neosporin Af [Miconazole] Itching   Prior to Admission medications   Medication Sig Start Date End Date Taking? Authorizing Provider  acetaminophen (TYLENOL) 325 MG tablet Take 2 tablets (650 mg total) by mouth every 8 (eight) hours as needed for mild pain. 11/16/16  Yes Simaan, Darci Current, PA-C  DULoxetine (CYMBALTA) 30 MG capsule Take 1 capsule (30 mg total) by mouth daily. Patient taking differently: Take 30 mg by mouth 2 (two) times daily.  11/17/16  Yes Simaan, Darci Current, PA-C  feeding supplement (BOOST / RESOURCE BREEZE) LIQD Take 1 Container by mouth 2 (two) times daily between meals. 11/16/16  Yes Simaan, Darci Current, PA-C  mesalamine (LIALDA) 1.2 G EC tablet Take 2 tablets (2.4 g total) by mouth 2 (two) times daily. Patient taking differently: Take 4.8 g by mouth daily with  breakfast.  03/17/15  Yes Withrow, Elyse Jarvis, FNP  metoprolol tartrate (LOPRESSOR) 25 MG tablet Take 1 tablet (25 mg total) by mouth every 6 (six) hours. Patient taking differently: Take 25 mg by mouth 3 (three) times daily.  11/16/16  Yes Simaan, Darci Current, PA-C  ondansetron (ZOFRAN) 4 MG tablet Take 4 mg by mouth every 8 (eight) hours as needed for nausea or vomiting.   Yes [provider]  polyethylene glycol (MIRALAX / GLYCOLAX) packet Take 17 g by mouth daily as needed. Patient taking differently: Take 17 g by mouth daily as needed. Constipation 11/16/16  Yes Simaan, Elizabeth S, PA-C  senna-docusate (SENNA-PLUS) 8.6-50 MG tablet Take 1 tablet by mouth daily.   Yes [provider]  traZODone (DESYREL) 50 MG tablet Take 1 tablet (50 mg total) by mouth at bedtime as needed for sleep. 03/17/15  Yes Withrow, Elyse Jarvis, FNP  Amino Acids-Protein Hydrolys (FEEDING SUPPLEMENT, PRO-STAT SUGAR FREE 64,) LIQD Take 30 mLs by mouth 2 (two) times daily. Patient not taking: Reported on 02/17/2017 11/16/16   Jill Alexanders, PA-C  aspirin 81 MG tablet Take 1 tablet (81 mg total) by mouth 2 (two) times daily after a meal. 02/20/17   Rilie Glanz, Aaron Edelman, MD  FLUoxetine (PROZAC) 10 MG capsule Take 1 capsule (10 mg total) by mouth daily. Patient not taking: Reported on 12/11/2016 03/17/15   Benjamine Mola, FNP  HYDROcodone-acetaminophen (NORCO/VICODIN) 5-325 MG tablet Take 1-2 tablets by mouth every 6 (six) hours as needed for moderate pain. 02/20/17   Koron Godeaux, Aaron Edelman, MD  sodium chloride 1 g tablet Take 1 tablet (1 g total) by mouth 3 (three) times daily with meals. Patient not taking: Reported on 02/17/2017 11/16/16   Jill Alexanders, PA-C  sodium chloride 1 g tablet Take 1 tablet (1 g total) by mouth 3 (three) times daily with meals. Patient not taking: Reported on 12/11/2016 11/17/16   Jill Alexanders, PA-C  traMADol Veatrice Bourbon) 50 MG tablet Take one tablet by mouth every 4 hours as needed for  pain Patient not taking: Reported on 12/11/2016 03/08/15   Gayland Curry, DO  traZODone (DESYREL) 50 MG tablet Take 0.5 tablets (25 mg total) by mouth at bedtime. Patient not taking: Reported on 02/17/2017 11/16/16   Jill Alexanders, PA-C   No results found.  Positive ROS: All other systems have been reviewed and were otherwise negative with the exception of those mentioned in the HPI and as above.  Physical Exam: General: Alert, no acute distress Cardiovascular: No pedal edema Respiratory: No cyanosis, no use of accessory musculature GI: No organomegaly, abdomen is soft and non-tender Skin: No lesions in the area of chief complaint Neurologic: Sensation intact distally Psychiatric: no acute distress. Lymphatic: No axillary or cervical lymphadenopathy  MUSCULOSKELETAL: examination of the left hip reveals no skin lesions or lesions. She does have shortening and rotational deformity of the extremity. She has pain with attempted logrolling. She is neurovascularly intact distally.  Assessment: Displaced left femoral neck fracture Multiple medical problems Home hospice for failure to thrive Severe depression with suicidal ideation  Plan: I discussed the findings with the patient and her family. She has been evaluated by cardiology. She has severe left hip pain due to the fracture. We did discuss the risks, benefits, and alternatives to left hip hemiarthroplasty. They desire to proceed in order to help with pain control and allow her to mobilize out of bed. I think this is reasonable. We will proceed with surgery.  The risks, benefits, and alternatives were discussed with the patient. There are risks associated with the surgery including, but not limited to, problems with anesthesia (death), infection, instability (giving out of the joint), dislocation, differences in leg length/angulation/rotation, fracture of bones, loosening or failure of implants, hematoma (blood accumulation) which  may require surgical drainage, blood clots, pulmonary embolism, nerve injury (foot drop and lateral thigh numbness), and blood vessel injury. The patient understands these risks and elects to proceed.   Sebastain Fishbaugh, Horald Pollen, MD Cell [phone]    02/22/2017 2:41 PM
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
## 2017-02-22 NOTE — Progress Notes (Addendum)
[facility] 5N03-[facility]-GIP RN Visit @ [phone]  This is a related and covered GIP admission of 09/22/2018with [facility] diagnosis of Protein malnutrition per Dr. Konrad Dolores. Patient code status: DNR. Family called EMS after finding patient had fallen and had been down several hours. Hospice was notified. Admitted to hospital for fractured left hip.   Visited patient at bedside. Noone else at bedside this morning.   Patient was asleep.  Patient did not arouse easily.  Patient in NAD.  Good rise and fall of chest.  Per note from CSW, patient to be transferred to SNF on Saturday.    Patient receiving:  Enoxaparin (LOVENOX) injection 30 mg, Dose 30 mg, Q24H via [place]; potassium chloride SA (K-DUR, KLOR-CON) CR tablet 40 mEq.  Continuous medications:  cefTRIAXone (ROCEPHIN) 1g in dextrose 5% 50 mL IVPB, Dose 1 g, Q24H via IV.  PRN medications given: acetaminophen (TYLENOL) tablet 650 mg, Dose 650 mg, Q6H PRN via PO at 0852.  We will continue to follow patient while she is hospitalized.  Please feel free to call me with any hospice related questions.   Thank you,  Edyth Gunnels, RN, BSN [facility] Liaison [phone]  All liaisons are now on [facility].

## 2017-02-23 LAB — CULTURE, BLOOD (ROUTINE X 2)
Culture: NO GROWTH
Culture: NO GROWTH
Special Requests: ADEQUATE

## 2017-02-23 LAB — MAGNESIUM: MAGNESIUM: 1.8 mg/dL (ref 1.7–2.4)

## 2017-02-23 LAB — GLUCOSE, CAPILLARY: GLUCOSE-CAPILLARY: 105 mg/dL — AB (ref 65–99)
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
## 2017-02-23 NOTE — Progress Notes (Signed)
[facility] 5N03[facility]-GIP RN Visit @ [phone]  This is a related and covered GIP admission of 09/22/2018with [facility] diagnosis of Protein malnutrition per Dr. Konrad Dolores. Patient code status: DNR. Family called EMS after finding patient had fallen and had been down several hours. Hospice was notified. Admitted to hospital for fractured left hip.   Visited patient at bedside  Maudie Mercury, bedside RN, was in the room along with 2 student nurses.  Patient was alert and oriented to self, situation, surroundings this morning.  Patient was sitting up in the bed and eating breakfast with assist from student nurses.  Patient NAD.  Patient denies pain at this time.  Patient is still a little confused about having foley.  Patient has dark yellow yield from foley today.  I spoke with Jodean Lima, who advised that the plan of care is still the same.  Patient will be transferring to [facility].    Patient receiving:  Enoxaparin (LOVENOX) injection 30 mg, Dose 30 mg, Q24H via [place]; potassium chloride SA (K-DUR, KLOR-CON) CR tablet 40 mEq.  Dose 40 mEq, QD via PO.  Continuous medications: none at this time.  PRN medications given: HYDROcodone-acetaminophen (NORCO/VICODIN) 5-325 MG per tablet 1-2 tablet, Dose 1 tablet, Q6H PRN via PO given at 0930am.   We will continue to monitor patient while hospitalized.   Thank you,  Edyth Gunnels, RN, BSN [facility] Liaison [phone]  All hospital liaisons are now on [place].
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
## 2017-02-24 LAB — GLUCOSE, CAPILLARY
GLUCOSE-CAPILLARY: 107 mg/dL — AB (ref 65–99)
Glucose-Capillary: 97 mg/dL (ref 65–99)

## 2017-02-24 LAB — CREATININE, SERUM
Creatinine, Ser: 0.53 mg/dL (ref 0.44–1.00)
GFR calc non Af Amer: >60 mL/min (ref >60)

## 2017-02-24 MED ORDER — POTASSIUM CHLORIDE CRYS ER 20 MEQ PO TBCR: 20.0000 meq | EXTENDED_RELEASE_TABLET | Freq: Every day | ORAL | Status: DC

## 2017-02-24 MED ORDER — METOPROLOL SUCCINATE ER 50 MG PO TB24
50.0000 mg | ORAL_TABLET | Freq: Every day | ORAL | 0 refills | Status: DC
Start: 2017-02-25 — End: 2017-10-02

## 2017-02-24 MED ORDER — LOSARTAN POTASSIUM 50 MG PO TABS: 50.0000 mg | ORAL_TABLET | Freq: Every day | ORAL | Status: DC

## 2017-02-24 NOTE — Care Management Note (Signed)
Case Management Note  Patient Details  Name: Stacy Mcguire MRN: [id number] Date of Birth: [birth year]  Subjective/Objective:                 Patient with order to discharge to [facility] (SNF). SNF discharge facilitated through [facility] [place] ([place]). Please refer to [place] notes for disposition plan and direct questions CSW on call accordingly.    Action/Plan:   Expected Discharge Date:  02/24/17               Expected Discharge Plan:  Skilled Nursing Facility  In-House Referral:  Clinical Social Work  Discharge planning Services  CM Consult  Post Acute Care Choice:    Choice offered to:     DME Arranged:    DME Agency:     HH Arranged:    [place] Agency:     Status of Service:  Completed, signed off  If discussed at [employer], dates discussed:    Additional Comments:  Carles Collet, RN 02/24/2017, 11:40 AM
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
## 2017-02-24 NOTE — Progress Notes (Signed)
[facility] 5N03-[facility] -[facility]-GIP RN Visit @ 7124  This is a related and covered GIP admission of 09/22/2018with [facility] diagnosis of Protein malnutrition per Dr. Konrad Dolores. Patient code status: DNR. Family called EMS after finding patient had fallen and had been down several hours. Hospice was notified. Admitted to hospital for fractured left hip.   Visited patient at bedside.  Patient was in bed on her right side with eyes closed.  She was easily aroused.  Patient alert and pleasantly disoriented.  Patient NAD.  Patient denies any pain at this time.  Patient does not remember that she is supposed to go back to [facility] today, and states "I don't want to go back there".  I spoke with Caryl Pina, CSW, she advised that she is to go back to [facility] today and continue to be followed by [facility].    Patient receiving:  Enoxaparin (LOVENOX) injection 30 mg, Dose 30 mg, Q24H via [place]; feeding supplement (ENSURE ENLIVE) liquid 237 mL, Dose 237 mL, TID between meals via PO; potassium chloride SA (K-DUR, KLOR-CON) CR tablet 40 mEq, Dose 40 mEq, QD via PO.  Continuous medications:  None.  PRN medications:  Patient has received no PRN medications today thus far.  We will continue to monitor patient while hospitalized.  Thank you,  Edyth Gunnels, RN, BSN [facility] Liaison [phone]  All hospital liaisons are now on [place].

## 2017-02-24 NOTE — Progress Notes (Signed)
Attempted to call report to [facility]. Receiving RN unable to take report. Left voicemail with call-back number to be reached at with admissions. [facility] to transport. Will continue to monitor

## 2017-02-24 NOTE — Progress Notes (Signed)
Clinical Social Worker facilitated patient discharge including contacting patient family and facility to confirm patient discharge plans.  Clinical information faxed to facility and family agreeable with plan.  CSW arranged ambulance transport via [facility] to [place].  RN to call [phone] for report prior to discharge.  Clinical Social Worker will sign off for now as social work intervention is no longer needed. Please consult [place] again if new need arises.  Rhea Pink, MSW, [place]

## 2017-03-21 ENCOUNTER — Ambulatory Visit: Payer: Self-pay | Admitting: Physician Assistant

## 2017-06-25 IMAGING — CR DG CHEST 2V
2 series · 2 of 2 positions shown · non-contrast
Comparison: Chest x-ray 11/03/2014.

CLINICAL DATA: 86-year-old female with lower chest pressure and
shortness of breath with nonproductive cough past 2 days.

EXAM:
CHEST  2 VIEW

[view not recorded (1 of 2)]
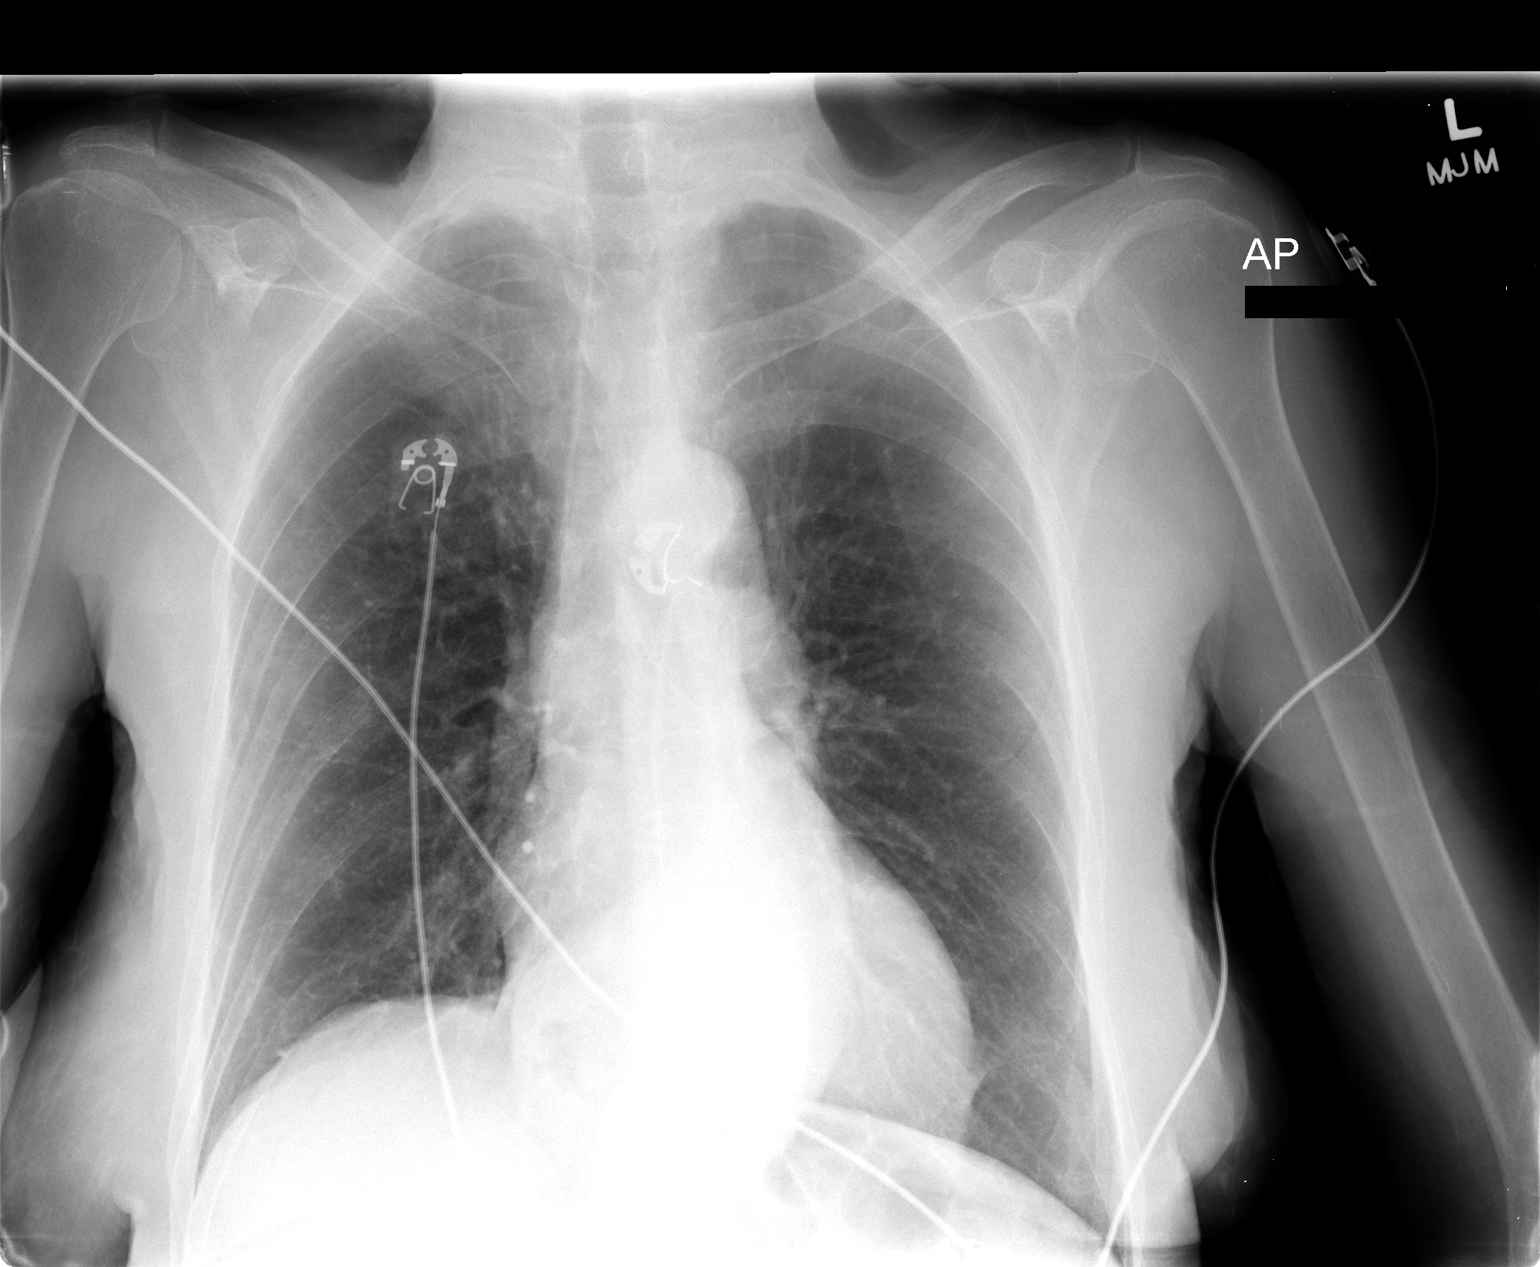

[view not recorded (2 of 2)]
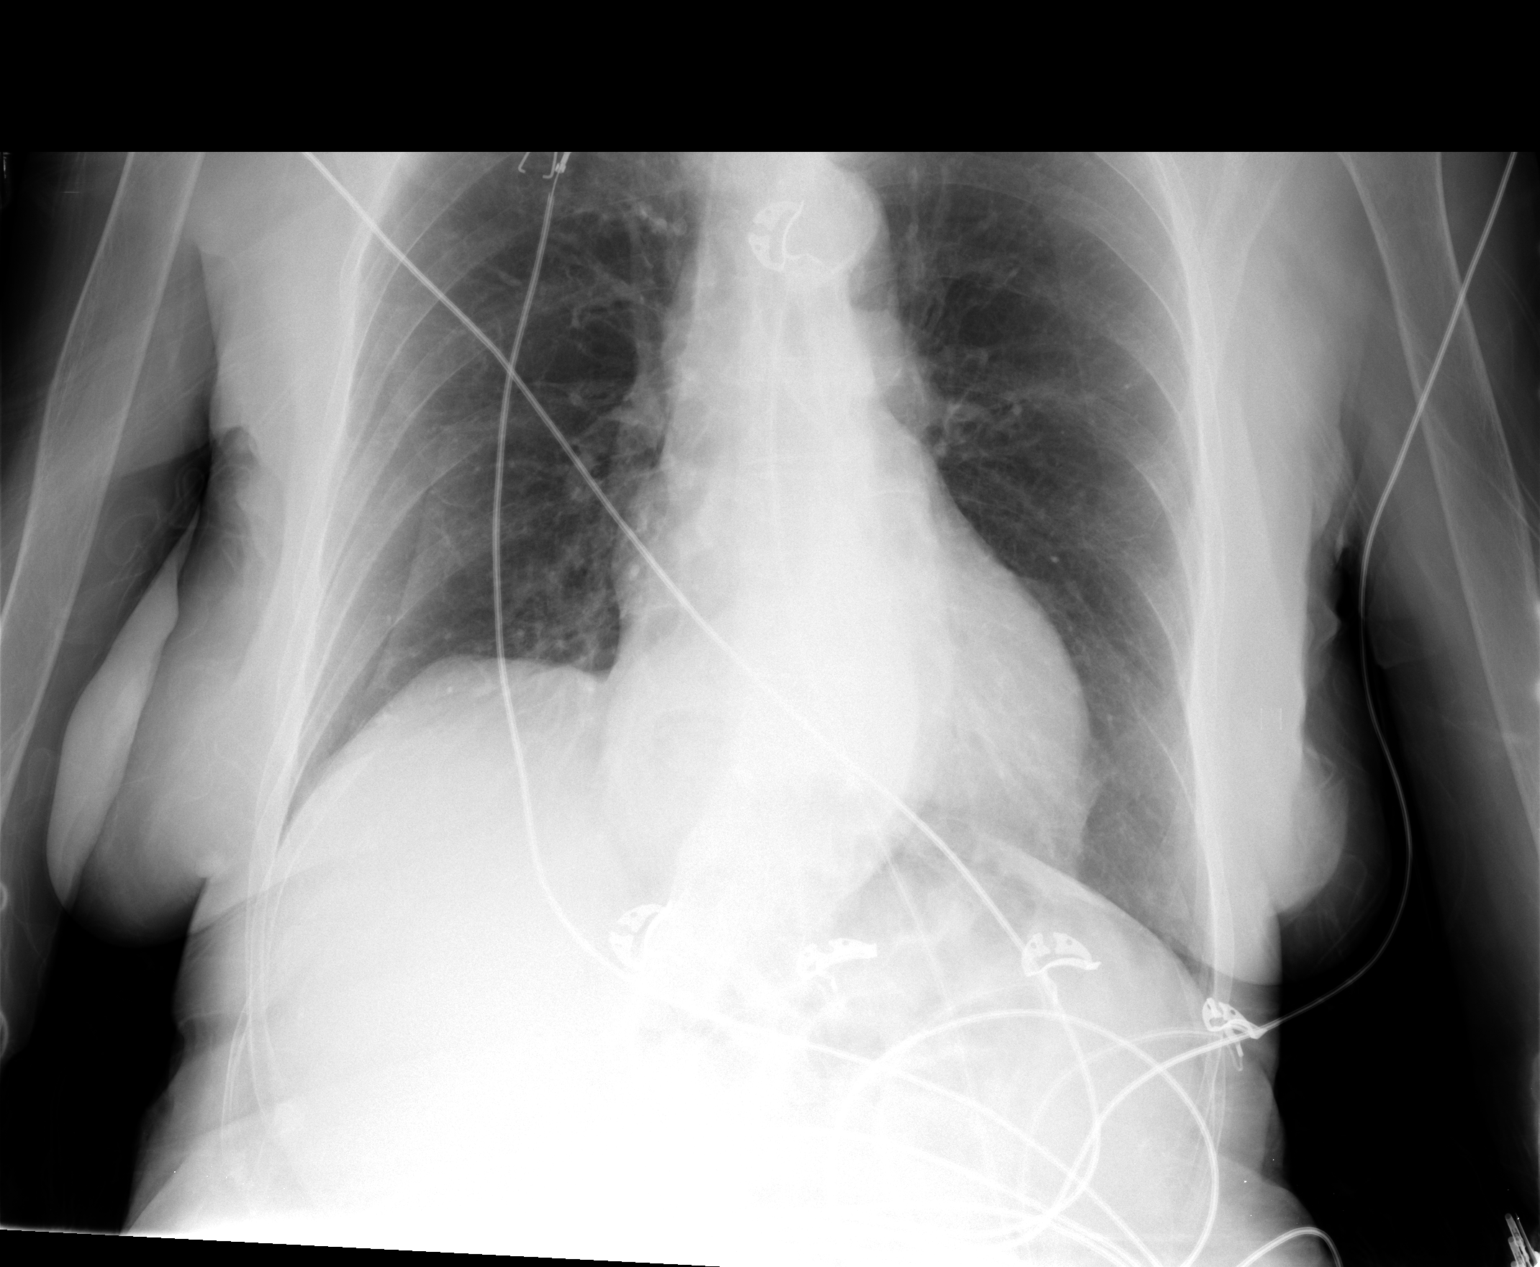

[2 of 2 positions shown; findings below may reference images not displayed]

FINDINGS: Lung volumes are normal. No consolidative airspace disease. No
pleural effusions. No evidence of pulmonary edema. Heart size is
normal. Upper mediastinal contours are within normal limits.
Atherosclerosis in the thoracic aorta. Large hiatal hernia.
IMPRESSION: 1. No radiographic evidence of acute cardiopulmonary disease.
2. Large hiatal hernia.
3. Atherosclerosis.

## 2017-06-25 IMAGING — CT CT ABD-PELV W/ CM
2 of 5 series · 15 of 46 positions shown, 17 images · IV contrast (OMNIPAQUE 300)
Comparison: CT of the pelvis 11/19/2006

CLINICAL DATA: Recently diagnosed with diverticulitis. Upper
abdominal and chest pain like a belt is tightening. Difficulty
swallowing. Unable to keep fluids down. 15 pound weight loss.
Weakness.

EXAM:
CT ABDOMEN AND PELVIS WITH CONTRAST
TECHNIQUE: Multidetector CT imaging of the abdomen and pelvis was performed
using the standard protocol following bolus administration of
intravenous contrast.
CONTRAST:  80mL OMNIPAQUE IOHEXOL 300 MG/ML  SOLN

[Series 2: abd/pel with · axial · 0.75mm/px · z∈[+1127,+1487]mm · 12 of 82 slices shown, 14 images]
[im 5/82  soft-tissue]
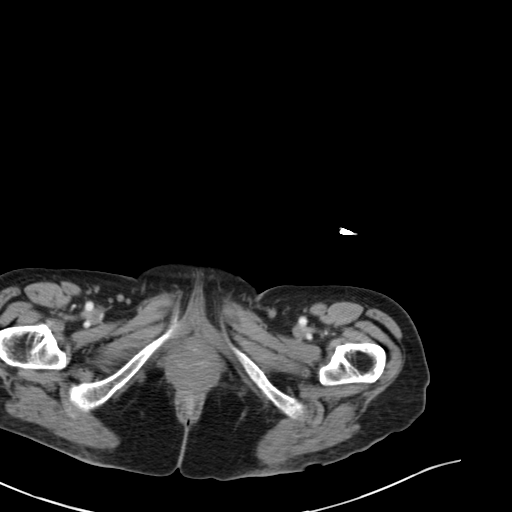
[im 5/82  bone]
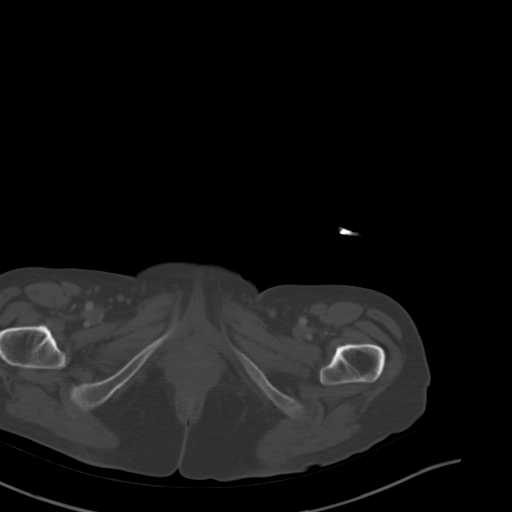
[im 14/82  soft-tissue]
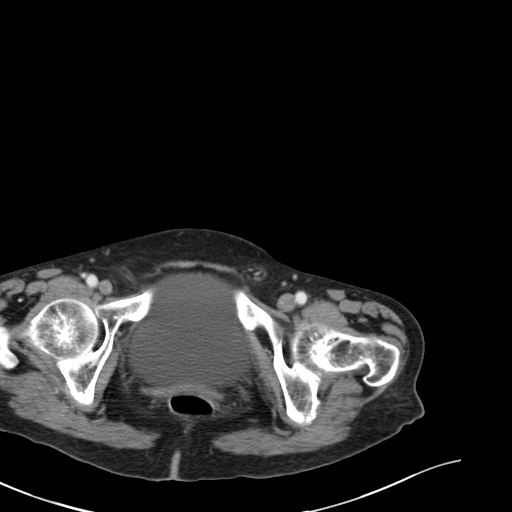
[im 19/82  soft-tissue]
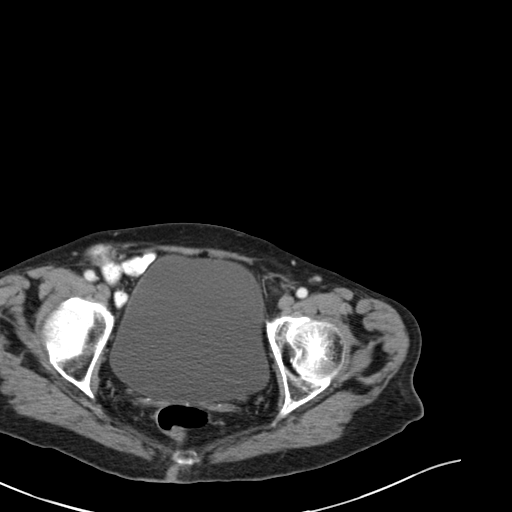
[im 23/82  soft-tissue]
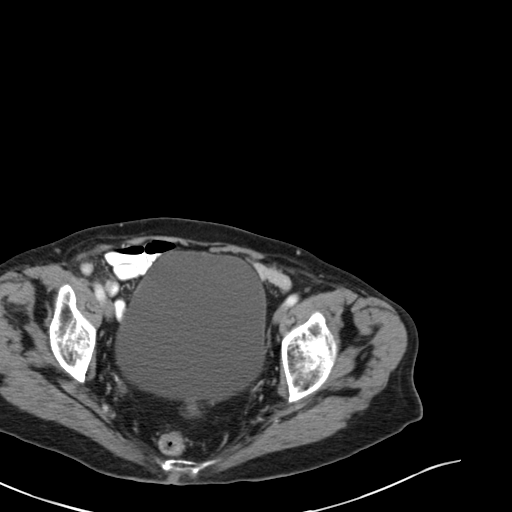
[im 32/82  soft-tissue]
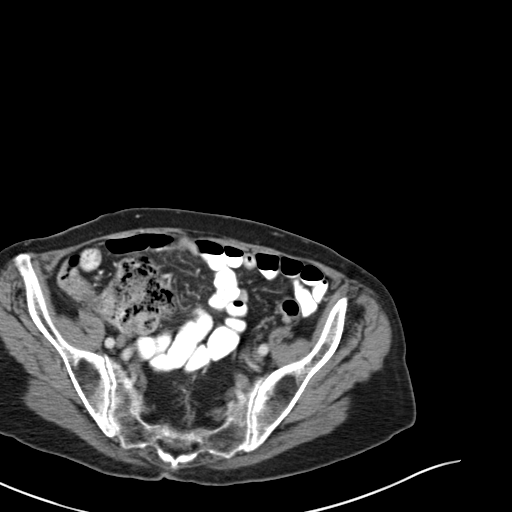
[im 37/82  soft-tissue]
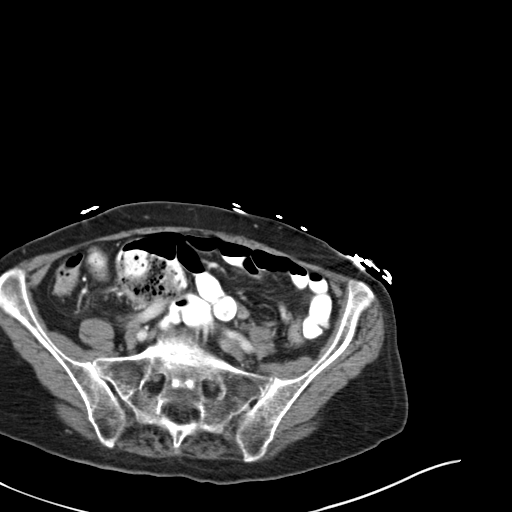
[im 46/82  soft-tissue]
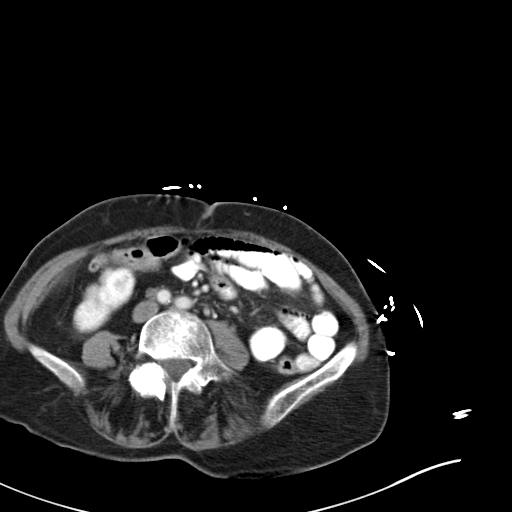
[im 50/82  soft-tissue]
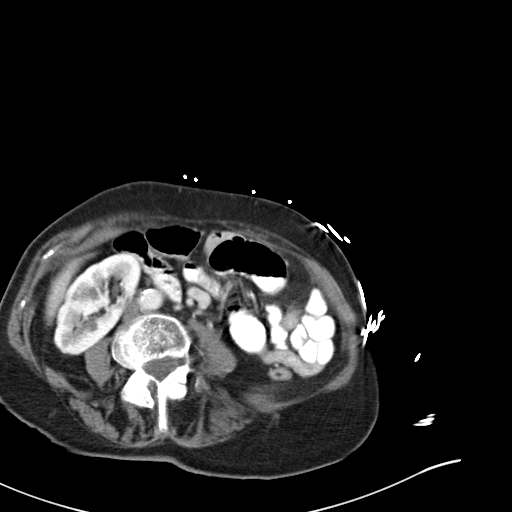
[im 59/82  soft-tissue]
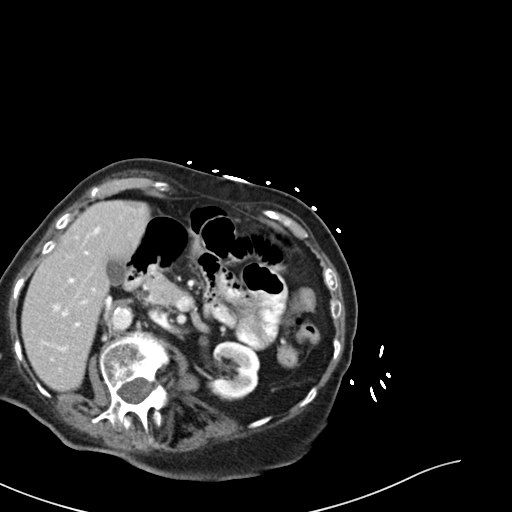
[im 59/82  bone]
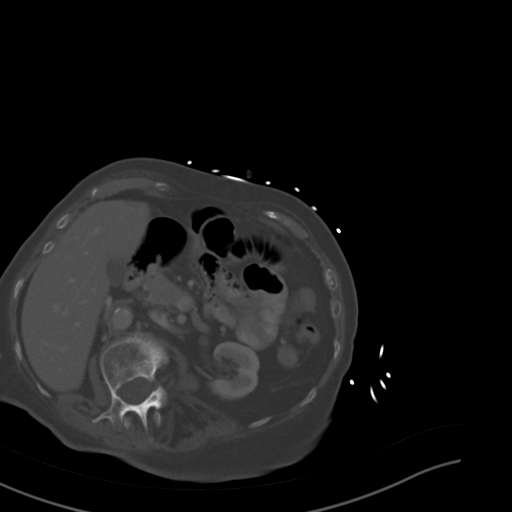
[im 64/82  soft-tissue]
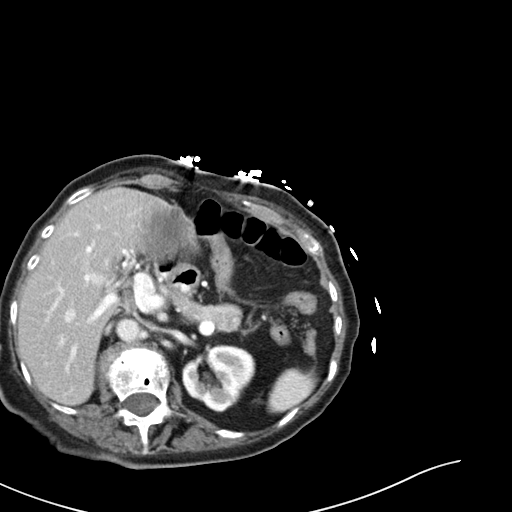
[im 68/82  soft-tissue]
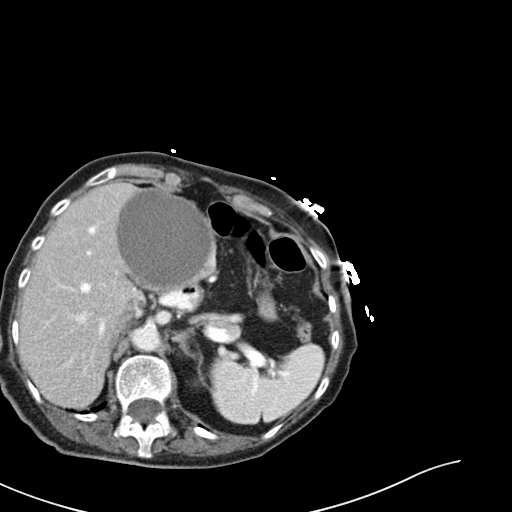
[im 77/82  soft-tissue]
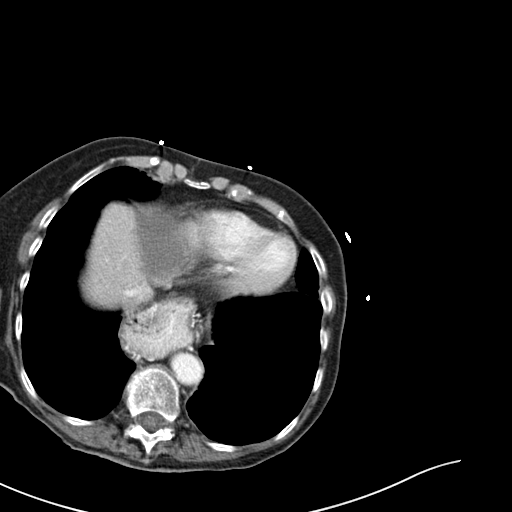

[Series 4: coronal a/|p · coronal · 0.76mm/px · 3 of 106 slices shown]
[im 36/106  soft-tissue]
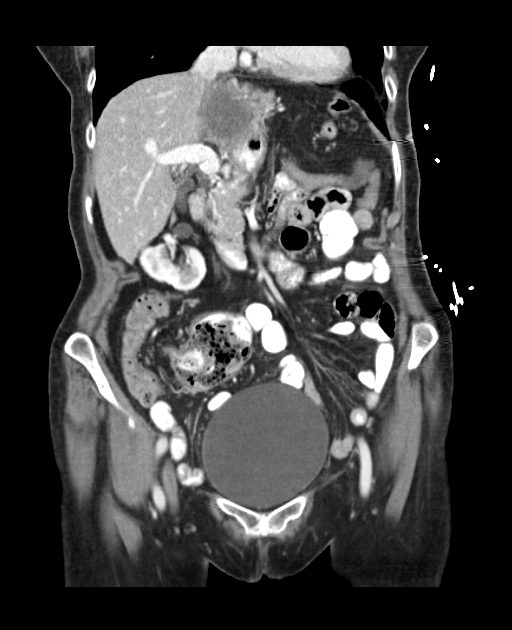
[im 47/106  soft-tissue]
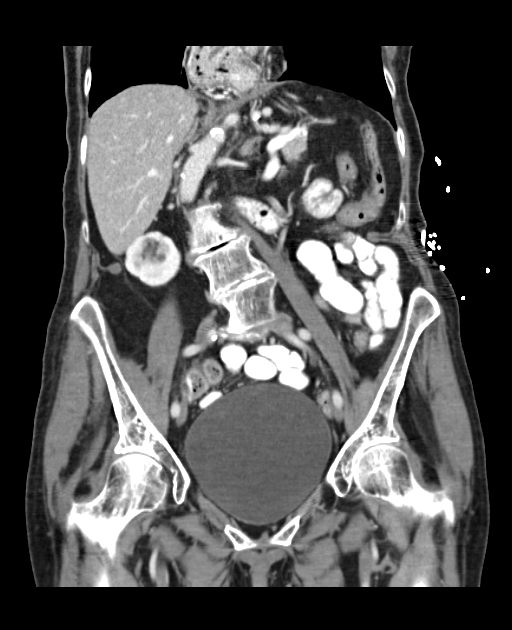
[im 59/106  soft-tissue]
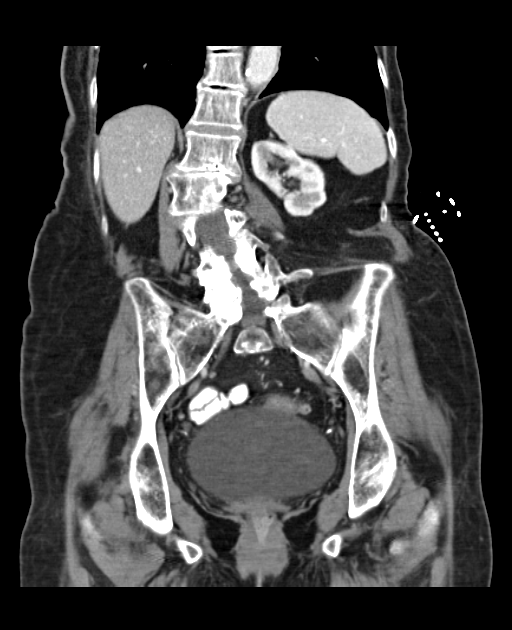

[15 of 46 positions shown; findings below may reference images not displayed]

FINDINGS: Lower chest: Large hiatal hernia is present. There is scarring or
atelectasis in the anterior aspects of the lungs bilaterally. The
heart is mildly enlarged. No pericardial effusion.

Upper abdomen: Within the left hepatic lobe there is a
low-attenuation lesion which measures 7.3 x 8.3 cm. Although
low-attenuation this does not represent a simple cyst by Hounsfield
unit measurements. A thin calcifications identified along the
superior aspect of the lesion. A small adjacent lesion is identified
within the lateral segment of left hepatic lobe measuring 2.5 x
cm and not further characterized.

No focal abnormality identified within the spleen, pancreas, or
adrenal glands. Small left renal cyst is identified. The right
kidney is somewhat ptotic in position possibly related to patient's
scoliosis no hydronephrosis. Gallbladder is present.

Gastrointestinal tract: Large hiatal hernia. Small bowel loops are
normal in caliber. There is a right inguinal hernia which does
contain nondilated loops of small bowel and appears uncomplicated.
The appendix is not well seen. Colonic diverticula are present no
acute diverticulitis identified.

Pelvis: Urinary bladder is significantly distended. Status post
hysterectomy. No adnexal mass or free pelvic fluid.

Retroperitoneum: There is atherosclerotic calcification of the
abdominal aorta. Aorta is tortuous but not aneurysmal. No
retroperitoneal or mesenteric adenopathy.

Abdominal wall: Right inguinal hernia containing nondilated loops of
small bowel.

Osseous structures: There are significant degenerative changes in
the thoracolumbar spine, associated with scoliosis. No suspicious
lytic or blastic lesions are identified.
IMPRESSION: 1. 8.3 cm indeterminate low-attenuation lesion and smaller 2.5 cm
lesion within the left hepatic lobe. Further evaluation with MRI is
recommended. MRI should be performed when the patient is clinically
stable and able to follow breath holding instructions (usually best
performed on an outpatient basis).
2. Large hiatal hernia.
3. Mild cardiomegaly.
4. Colonic diverticulosis.
5. Status post hysterectomy.
6. Right inguinal hernia containing small bowel loops.

## 2017-09-03 ENCOUNTER — Emergency Department (HOSPITAL_COMMUNITY): Admission: EM | Admit: 2017-09-03 | Discharge: 2017-09-03 | Discharge status: Against Medical Advice

## 2017-10-01 ENCOUNTER — Other Ambulatory Visit: Payer: Self-pay

## 2017-10-01 ENCOUNTER — Emergency Department (HOSPITAL_COMMUNITY): Payer: Medicare PPO

## 2017-10-01 ENCOUNTER — Encounter (HOSPITAL_COMMUNITY): Payer: Self-pay | Admitting: Emergency Medicine

## 2017-10-01 ENCOUNTER — Inpatient Hospital Stay (HOSPITAL_COMMUNITY)
Admission: EM | Admit: 2017-10-01 | Discharge: 2017-10-27 | DRG: 871 | Disposition: E | Payer: Medicare PPO | Attending: Internal Medicine | Admitting: Internal Medicine

## 2017-10-01 ENCOUNTER — Inpatient Hospital Stay (HOSPITAL_COMMUNITY): Payer: Medicare PPO

## 2017-10-01 DIAGNOSIS — R64 Cachexia: Secondary | ICD-10-CM | POA: Diagnosis present

## 2017-10-01 DIAGNOSIS — N183 Chronic kidney disease, stage 3 unspecified: Secondary | ICD-10-CM | POA: Diagnosis present

## 2017-10-01 DIAGNOSIS — Z66 Do not resuscitate: Secondary | ICD-10-CM | POA: Diagnosis present

## 2017-10-01 DIAGNOSIS — K219 Gastro-esophageal reflux disease without esophagitis: Secondary | ICD-10-CM | POA: Diagnosis present

## 2017-10-01 DIAGNOSIS — Z915 Personal history of self-harm: Secondary | ICD-10-CM

## 2017-10-01 DIAGNOSIS — K51218 Ulcerative (chronic) proctitis with other complication: Secondary | ICD-10-CM | POA: Diagnosis not present

## 2017-10-01 DIAGNOSIS — R6520 Severe sepsis without septic shock: Secondary | ICD-10-CM | POA: Diagnosis present

## 2017-10-01 DIAGNOSIS — Z681 Body mass index (BMI) 19 or less, adult: Secondary | ICD-10-CM

## 2017-10-01 DIAGNOSIS — D649 Anemia, unspecified: Secondary | ICD-10-CM | POA: Diagnosis present

## 2017-10-01 DIAGNOSIS — A419 Sepsis, unspecified organism: Secondary | ICD-10-CM | POA: Diagnosis present

## 2017-10-01 DIAGNOSIS — R740 Nonspecific elevation of levels of transaminase and lactic acid dehydrogenase [LDH]: Secondary | ICD-10-CM

## 2017-10-01 DIAGNOSIS — Y95 Nosocomial condition: Secondary | ICD-10-CM | POA: Diagnosis present

## 2017-10-01 DIAGNOSIS — F32A Depression, unspecified: Secondary | ICD-10-CM | POA: Diagnosis present

## 2017-10-01 DIAGNOSIS — I4891 Unspecified atrial fibrillation: Secondary | ICD-10-CM

## 2017-10-01 DIAGNOSIS — D631 Anemia in chronic kidney disease: Secondary | ICD-10-CM | POA: Diagnosis present

## 2017-10-01 DIAGNOSIS — Z881 Allergy status to other antibiotic agents status: Secondary | ICD-10-CM

## 2017-10-01 DIAGNOSIS — R636 Underweight: Secondary | ICD-10-CM | POA: Diagnosis present

## 2017-10-01 DIAGNOSIS — N179 Acute kidney failure, unspecified: Secondary | ICD-10-CM | POA: Diagnosis present

## 2017-10-01 DIAGNOSIS — Z515 Encounter for palliative care: Secondary | ICD-10-CM | POA: Diagnosis present

## 2017-10-01 DIAGNOSIS — K5790 Diverticulosis of intestine, part unspecified, without perforation or abscess without bleeding: Secondary | ICD-10-CM | POA: Diagnosis present

## 2017-10-01 DIAGNOSIS — Z9181 History of falling: Secondary | ICD-10-CM

## 2017-10-01 DIAGNOSIS — J189 Pneumonia, unspecified organism: Secondary | ICD-10-CM | POA: Diagnosis present

## 2017-10-01 DIAGNOSIS — Z90710 Acquired absence of both cervix and uterus: Secondary | ICD-10-CM | POA: Diagnosis not present

## 2017-10-01 DIAGNOSIS — F329 Major depressive disorder, single episode, unspecified: Secondary | ICD-10-CM | POA: Diagnosis present

## 2017-10-01 DIAGNOSIS — I480 Paroxysmal atrial fibrillation: Secondary | ICD-10-CM | POA: Diagnosis present

## 2017-10-01 DIAGNOSIS — Z803 Family history of malignant neoplasm of breast: Secondary | ICD-10-CM | POA: Diagnosis not present

## 2017-10-01 DIAGNOSIS — K5190 Ulcerative colitis, unspecified, without complications: Secondary | ICD-10-CM | POA: Diagnosis present

## 2017-10-01 DIAGNOSIS — E860 Dehydration: Secondary | ICD-10-CM | POA: Diagnosis present

## 2017-10-01 DIAGNOSIS — R627 Adult failure to thrive: Secondary | ICD-10-CM | POA: Diagnosis present

## 2017-10-01 DIAGNOSIS — Z87440 Personal history of urinary (tract) infections: Secondary | ICD-10-CM

## 2017-10-01 DIAGNOSIS — Z96642 Presence of left artificial hip joint: Secondary | ICD-10-CM | POA: Diagnosis present

## 2017-10-01 DIAGNOSIS — G8929 Other chronic pain: Secondary | ICD-10-CM | POA: Diagnosis present

## 2017-10-01 DIAGNOSIS — Z791 Long term (current) use of non-steroidal anti-inflammatories (NSAID): Secondary | ICD-10-CM

## 2017-10-01 DIAGNOSIS — Z7982 Long term (current) use of aspirin: Secondary | ICD-10-CM

## 2017-10-01 DIAGNOSIS — K5120 Ulcerative (chronic) proctitis without complications: Secondary | ICD-10-CM | POA: Diagnosis present

## 2017-10-01 DIAGNOSIS — M1990 Unspecified osteoarthritis, unspecified site: Secondary | ICD-10-CM | POA: Diagnosis present

## 2017-10-01 DIAGNOSIS — J181 Lobar pneumonia, unspecified organism: Secondary | ICD-10-CM

## 2017-10-01 DIAGNOSIS — R7401 Elevation of levels of liver transaminase levels: Secondary | ICD-10-CM

## 2017-10-01 LAB — CBC WITH DIFFERENTIAL/PLATELET
BASOS ABS: 0 10*3/uL (ref 0.0–0.1)
Basophils Relative: 0 %
EOS ABS: 0 10*3/uL (ref 0.0–0.7)
Eosinophils Relative: 0 %
HCT: 42.7 % (ref 36.0–46.0)
Hemoglobin: 13.5 g/dL (ref 12.0–15.0)
LYMPHS ABS: 1.5 10*3/uL (ref 0.7–4.0)
Lymphocytes Relative: 8 %
MCH: 24.3 pg — ABNORMAL LOW (ref 26.0–34.0)
MCHC: 31.6 g/dL (ref 30.0–36.0)
MCV: 76.8 fL — ABNORMAL LOW (ref 78.0–100.0)
MONOS PCT: 8 %
Monocytes Absolute: 1.5 10*3/uL — ABNORMAL HIGH (ref 0.1–1.0)
Neutro Abs: 16.3 10*3/uL — ABNORMAL HIGH (ref 1.7–7.7)
Neutrophils Relative %: 84 %
PLATELETS: 190 10*3/uL (ref 150–400)
RBC: 5.56 MIL/uL — AB (ref 3.87–5.11)
RDW: 18.4 % — AB (ref 11.5–15.5)
WBC: 19.3 10*3/uL — AB (ref 4.0–10.5)

## 2017-10-01 LAB — COMPREHENSIVE METABOLIC PANEL
ALT: 533 U/L — ABNORMAL HIGH (ref 14–54)
ANION GAP: 15 (ref 5–15)
AST: 248 U/L — ABNORMAL HIGH (ref 15–41)
Albumin: 3.1 g/dL — ABNORMAL LOW (ref 3.5–5.0)
Alkaline Phosphatase: 302 U/L — ABNORMAL HIGH (ref 38–126)
BUN: 48 mg/dL — ABNORMAL HIGH (ref 6–20)
CO2: 16 mmol/L — ABNORMAL LOW (ref 22–32)
Calcium: 9.1 mg/dL (ref 8.9–10.3)
Chloride: 112 mmol/L — ABNORMAL HIGH (ref 101–111)
Creatinine, Ser: 1.46 mg/dL — ABNORMAL HIGH (ref 0.44–1.00)
GFR calc Af Amer: 36 mL/min — ABNORMAL LOW (ref >60)
GFR calc non Af Amer: 31 mL/min — ABNORMAL LOW (ref >60)
Glucose, Bld: 106 mg/dL — ABNORMAL HIGH (ref 65–99)
POTASSIUM: 4.9 mmol/L (ref 3.5–5.1)
Sodium: 143 mmol/L (ref 135–145)
TOTAL PROTEIN: 6.4 g/dL — AB (ref 6.5–8.1)
Total Bilirubin: 1.4 mg/dL — ABNORMAL HIGH (ref 0.3–1.2)

## 2017-10-01 LAB — I-STAT CG4 LACTIC ACID, ED: LACTIC ACID, VENOUS: 3.36 mmol/L — AB (ref 0.5–1.9)

## 2017-10-01 MED ORDER — GLYCOPYRROLATE 1 MG PO TABS: 1.0000 mg | ORAL_TABLET | ORAL | Status: DC | PRN

## 2017-10-01 MED ORDER — BIOTENE DRY MOUTH MT LIQD: 15.0000 mL | OROMUCOSAL | Status: DC | PRN

## 2017-10-01 MED ORDER — VANCOMYCIN HCL 500 MG IV SOLR: 500.0000 mg | INTRAVENOUS | Status: DC

## 2017-10-01 MED ORDER — SODIUM CHLORIDE 0.9 % IV SOLN: 12.5000 mg | Freq: Four times a day (QID) | INTRAVENOUS | Status: DC | PRN

## 2017-10-01 MED ORDER — HALOPERIDOL LACTATE 5 MG/ML IJ SOLN: 0.5000 mg | INTRAMUSCULAR | Status: DC | PRN

## 2017-10-01 MED ORDER — PIPERACILLIN-TAZOBACTAM 3.375 G IVPB 30 MIN
3.3750 g | Freq: Once | INTRAVENOUS | Status: DC
  Administered 2017-10-01: 3.375 g via INTRAVENOUS
  Filled 2017-10-01: qty 50

## 2017-10-01 MED ORDER — SODIUM CHLORIDE 0.9 % IV BOLUS: 500.0000 mL | Freq: Once | INTRAVENOUS | Status: DC

## 2017-10-01 MED ORDER — PIPERACILLIN-TAZOBACTAM 3.375 G IVPB: 3.3750 g | Freq: Three times a day (TID) | INTRAVENOUS | Status: DC

## 2017-10-01 MED ORDER — ONDANSETRON HCL 4 MG/2ML IJ SOLN: 4.0000 mg | Freq: Four times a day (QID) | INTRAMUSCULAR | Status: DC | PRN

## 2017-10-01 MED ORDER — ACETAMINOPHEN 650 MG RE SUPP: 650.0000 mg | Freq: Four times a day (QID) | RECTAL | Status: DC | PRN

## 2017-10-01 MED ORDER — LORAZEPAM 2 MG/ML PO CONC: 1.0000 mg | ORAL | Status: DC | PRN

## 2017-10-01 MED ORDER — GLYCOPYRROLATE 0.2 MG/ML IJ SOLN: 0.2000 mg | INTRAMUSCULAR | Status: DC | PRN

## 2017-10-01 MED ORDER — LORAZEPAM 2 MG/ML IJ SOLN: 1.0000 mg | INTRAMUSCULAR | Status: DC | PRN

## 2017-10-01 MED ORDER — FLUOXETINE HCL 10 MG PO CAPS: 10.0000 mg | ORAL_CAPSULE | Freq: Every day | ORAL | Status: DC

## 2017-10-01 MED ORDER — MORPHINE 100MG IN NS 100ML (1MG/ML) PREMIX INFUSION
1.0000 mg/h | INTRAVENOUS | Status: DC
  Administered 2017-10-01: 1 mg/h via INTRAVENOUS
  Filled 2017-10-01: qty 100

## 2017-10-01 MED ORDER — DULOXETINE HCL 30 MG PO CPEP
30.0000 mg | ORAL_CAPSULE | Freq: Two times a day (BID) | ORAL | Status: DC
  Filled 2017-10-01 (×2): qty 1

## 2017-10-01 MED ORDER — LORAZEPAM 1 MG PO TABS: 1.0000 mg | ORAL_TABLET | ORAL | Status: DC | PRN

## 2017-10-01 MED ORDER — HALOPERIDOL 0.5 MG PO TABS
0.5000 mg | ORAL_TABLET | ORAL | Status: DC | PRN
  Filled 2017-10-01: qty 1

## 2017-10-01 MED ORDER — DIPHENHYDRAMINE HCL 50 MG/ML IJ SOLN: 12.5000 mg | INTRAMUSCULAR | Status: DC | PRN

## 2017-10-01 MED ORDER — SODIUM CHLORIDE 0.9 % IV BOLUS
1000.0000 mL | Freq: Once | INTRAVENOUS | Status: DC
  Administered 2017-10-01: 1000 mL via INTRAVENOUS

## 2017-10-01 MED ORDER — ONDANSETRON 4 MG PO TBDP: 4.0000 mg | ORAL_TABLET | Freq: Four times a day (QID) | ORAL | Status: DC | PRN

## 2017-10-01 MED ORDER — MORPHINE BOLUS VIA INFUSION: 1.0000 mg | INTRAVENOUS | Status: DC | PRN

## 2017-10-01 MED ORDER — MAGIC MOUTHWASH: 15.0000 mL | Freq: Four times a day (QID) | ORAL | Status: DC | PRN

## 2017-10-01 MED ORDER — TRAZODONE HCL 50 MG PO TABS: 50.0000 mg | ORAL_TABLET | Freq: Every evening | ORAL | Status: DC | PRN

## 2017-10-01 MED ORDER — SODIUM CHLORIDE 0.9 % IV SOLN
INTRAVENOUS | Status: DC
Start: 2017-10-01 — End: 2017-10-02
  Administered 2017-10-01: 10 mL/h via INTRAVENOUS

## 2017-10-01 MED ORDER — POLYVINYL ALCOHOL 1.4 % OP SOLN: 1.0000 [drp] | Freq: Four times a day (QID) | OPHTHALMIC | Status: DC | PRN

## 2017-10-01 MED ORDER — ACETAMINOPHEN 325 MG PO TABS: 650.0000 mg | ORAL_TABLET | Freq: Four times a day (QID) | ORAL | Status: DC | PRN

## 2017-10-01 MED ORDER — HALOPERIDOL LACTATE 2 MG/ML PO CONC
0.5000 mg | ORAL | Status: DC | PRN
  Filled 2017-10-01: qty 0.3

## 2017-10-01 MED ORDER — VANCOMYCIN HCL IN DEXTROSE 1-5 GM/200ML-% IV SOLN
1000.0000 mg | Freq: Once | INTRAVENOUS | Status: DC
  Filled 2017-10-01: qty 200
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
## 2017-10-01 NOTE — Progress Notes (Signed)
Pharmacy Antibiotic Note  Stacy Mcguire is a 84 y.o. female admitted on 10/11/2017 with sepsis. Pharmacy has been consulted for vancomycin and Zosyn dosing. WBC 19.3, LA 3.36, temp 97.4 in ED. Scr 1.46 (BL ~0.5-0.7), estimated CrCl ~23 mL/min.  Plan: Vancomycin 500mg  IV q24h Zosyn 3.375g IV q8h (4 hr infusion) F/u C&S, clinical status, renal function, de-escalation, LOT, vancomycin levels as indicated  Weight: 110 lb (49.9 kg)  Temp (24hrs), Avg:97.4 F (36.3 C), Min:97.4 F (36.3 C), Max:97.4 F (36.3 C)  Recent Labs  Lab 10/17/2017 1257 10/03/2017 1307  WBC 19.3*  --   CREATININE 1.46*  --   LATICACIDVEN  --  3.36*    CrCl cannot be calculated (Unknown ideal weight.).    Allergies  Allergen Reactions  . Neosporin Af [Miconazole] Itching    Antimicrobials this admission: Vanc 5/6 >> Zosyn 5/6 >>  Dose adjustments this admission: None  Microbiology results: 5/6 BCx: pending  Thank you for allowing pharmacy to be a part of this patient's care.  Mila Merry Gerarda Fraction, PharmD PGY1 Pharmacy Resident Pager: [phone] 10/20/2017 3:29 PM
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
## 2017-10-01 NOTE — ED Notes (Signed)
Attempoted to call report

## 2017-10-01 NOTE — ED Triage Notes (Signed)
Pt to ED via [facility] from [facility]- with c/o fall, when EMS arrived, found pt on floor, tachycardic. When placed in ambulance, pt's heart rate increased to 180-- received 6mg  adenosine, without conversion, received 12mg  adenosine, heart rate decreased to 130's.  On arrival to ED, pt has towel around neck for c-collar, pt is awake, responds to commands.  18g IV in right AC per EMS,

## 2017-10-02 DIAGNOSIS — A419 Sepsis, unspecified organism: Principal | ICD-10-CM

## 2017-10-02 DIAGNOSIS — K51218 Ulcerative (chronic) proctitis with other complication: Secondary | ICD-10-CM

## 2017-10-02 DIAGNOSIS — J189 Pneumonia, unspecified organism: Secondary | ICD-10-CM

## 2017-10-02 DIAGNOSIS — Z515 Encounter for palliative care: Secondary | ICD-10-CM

## 2017-10-02 DIAGNOSIS — R740 Nonspecific elevation of levels of transaminase and lactic acid dehydrogenase [LDH]: Secondary | ICD-10-CM

## 2017-10-02 DIAGNOSIS — R627 Adult failure to thrive: Secondary | ICD-10-CM

## 2017-10-06 LAB — CULTURE, BLOOD (ROUTINE X 2): Culture: NO GROWTH

## 2017-10-27 NOTE — Consult Note (Signed)
            [facility] CM Primary Care Navigator  10/03/2017  Stacy Mcguire [facility] 01/05/1928 [id number]   Attempt to see patient earlier today at the bedsideto identify possible discharge needs but staffreportsthat patient expired.   Per Inpatient social worker note, patient was currently part of [name].   For additional questions please contact:  [name], BSN, RN-BC [facility] PRIMARY CARE Navigator Cell: [phone]

## 2017-10-27 NOTE — Social Work (Signed)
CSW spoke with [facility], pt is currently part of [facility] services. They are following for possible admit to [facility] pending bed availability.   CSW continuing to follow.  Alexander Mt, [place] Work [phone]

## 2017-10-27 NOTE — Progress Notes (Signed)
Nutrition Brief Note  Chart reviewed. Pt now transitioning to comfort care.  No further nutrition interventions warranted at this time.  Please re-consult as needed.   Chaz Ronning A. Chaden Doom, RD, LDN, CDE Pager: [phone] After hours Pager: [phone]  

## 2017-10-27 NOTE — Progress Notes (Signed)
PROGRESS NOTE  LAMEKA DISLA WUJ:[id number] DOB: 25-May-1928 DOA: 10/04/2017 PCP: Leighton Ruff, MD  HPI/Recap of past 24 hours:  She is on morphine drip, open eyes briefly to voice, does not appear in distress  Assessment/Plan: Principal Problem:   Sepsis ([place]) Active Problems:   Anemia, unspecified   Ulcerative (chronic) proctitis (HCC)   Depression   Failure to thrive in adult   Kidney disease, chronic, stage III (moderate, EGFR 30-59 ml/min) (HCC)   HCAP (healthcare-associated pneumonia)   PAF (paroxysmal atrial fibrillation) ([place])   End of life care   Acute kidney injury ([place])  Continue full comfort measures, no labs, no imaging, goals of care is comfort. I have talked to daughter in law Laretta Alstrom who is the HCPOA. She agrees with the plan. I have talked to hospice liaison Bevely Palmer over the phone, she is aware patient needs residential hospice placement at [facility].  Code Status: DNR  Family Communication: patient and family  Disposition Plan: in hospital death vs residential hospice   Consultants:  Hospice  Social worker  Procedures:  none  Antibiotics:  none   Objective: BP 132/85 (BP Location: Left Arm)   Pulse 99   Temp (!) 97.5 F (36.4 C) (Oral)   Resp 15   Wt 49.1 kg (108 lb 3.9 oz)   SpO2 94%   BMI 19.17 kg/m   Intake/Output Summary (Last 24 hours) at 10-28-17 0957 Last data filed at 2017-10-28 0300 Gross per 24 hour  Intake 77 ml  Output -  Net 77 ml   Filed Weights   10/21/2017 1318 10/03/2017 2154  Weight: 49.9 kg (110 lb) 49.1 kg (108 lb 3.9 oz)    Exam: Patient is examined daily including today on 2017-10-28, exams remain the same as of yesterday except that has changed    General:  Appear comfortable, open eyes briefly to voice, thin, chronically ill   Cardiovascular: RRR  Respiratory: diminished at basis, no wheezing  Abdomen: Soft/ND/NT, positive BS  Musculoskeletal: diffuse  Edema  Neuro: open eyes briefly to  voice,  Data Reviewed: Basic Metabolic Panel: Recent Labs  Lab 10/15/2017 1257  NA 143  K 4.9  CL 112*  CO2 16*  GLUCOSE 106*  BUN 48*  CREATININE 1.46*  CALCIUM 9.1   Liver Function Tests: Recent Labs  Lab 10/05/2017 1257  AST 248*  ALT 533*  ALKPHOS 302*  BILITOT 1.4*  PROT 6.4*  ALBUMIN 3.1*   No results for input(s): LIPASE, AMYLASE in the last 168 hours. No results for input(s): AMMONIA in the last 168 hours. CBC: Recent Labs  Lab 10/14/2017 1257  WBC 19.3*  NEUTROABS 16.3*  HGB 13.5  HCT 42.7  MCV 76.8*  PLT 190   Cardiac Enzymes:   No results for input(s): CKTOTAL, CKMB, CKMBINDEX, TROPONINI in the last 168 hours. BNP (last 3 results) No results for input(s): BNP in the last 8760 hours.  ProBNP (last 3 results) No results for input(s): PROBNP in the last 8760 hours.  CBG: No results for input(s): GLUCAP in the last 168 hours.  No results found for this or any previous visit (from the past 240 hour(s)).   Studies: Ct Head Wo Contrast  Result Date: 09/30/2017 CLINICAL DATA:  Altered level of consciousness EXAM: CT HEAD WITHOUT CONTRAST TECHNIQUE: Contiguous axial images were obtained from the base of the skull through the vertex without intravenous contrast. COMPARISON:  02/17/2017 FINDINGS: Brain: Mild atrophic changes are noted. Scattered lacunar infarcts are seen  particularly within the cerebellum on the right. Chronic white matter ischemic changes also identified. No findings to suggest acute hemorrhage, acute infarction or space-occupying mass lesion are noted. Vascular: No hyperdense vessel or unexpected calcification. Skull: Normal. Negative for fracture or focal lesion. Sinuses/Orbits: No acute finding. Other: None. IMPRESSION: Chronic atrophic and ischemic changes without acute abnormality. Electronically Signed   By: Inez Catalina M.D.   On: 10/08/2017 13:34   Dg Chest Port 1 View  Result Date: 10/09/2017 CLINICAL DATA:  Tachycardia EXAM: PORTABLE  CHEST 1 VIEW COMPARISON:  Portable exam 1304 hours compared to 09/03/2017 FINDINGS: Enlargement of cardiac silhouette. Atherosclerotic calcification aorta. Diffuse BILATERAL pulmonary infiltrates greatest in RIGHT upper lobe and LEFT lower lobe. Small bibasilar effusions. Biapical scarring. No pneumothorax. Bones demineralized. IMPRESSION: Enlargement of cardiac silhouette. BILATERAL pulmonary infiltrates question multifocal pneumonia versus asymmetric edema. Electronically Signed   By: Lavonia Dana M.D.   On: 10/04/2017 13:17    Scheduled Meds: . DULoxetine  30 mg Oral BID    Continuous Infusions: . sodium chloride 10 mL/hr (10/13/2017 1827)  . chlorproMAZINE (THORAZINE) IV    . morphine 1 mg/hr (10/14/2017 1827)     Time spent: 35 mins I have personally reviewed and interpreted on  10/25/2017 daily labs, tele strips, imagings as discussed above under date review session and assessment and plans.  I reviewed all nursing notes,  vitals, pertinent old records  I have discussed plan of care as described above with RN , patient and family on 2017-10-25   Florencia Reasons MD, PhD  [facility] Pager [phone]. If 7PM-7AM, please contact night-coverage at [email or web address], password [facility] 10-25-17, 9:57 AM  LOS: 1 day

## 2017-10-27 NOTE — Progress Notes (Signed)
[facility])  Received request from MD for family interest in [facility]. Plan was for patient to be admitted to [facility] services at the facility but patient came to hospital before admission. Spoke with daughter by phone this morning to confirm interest and offer support. She is aware [facility] is unable to offer a room today and that we will follow up if availability changes. CSW [place] aware.   Thank you,  Erling Conte, LCSW [phone]

## 2017-10-27 NOTE — Care Management Note (Signed)
Case Management Note  Patient Details  Name: Stacy Mcguire MRN: [id number] Date of Birth: December 21, 1927  Subjective/Objective:                    Action/Plan:   Expected Discharge Date:                  Expected Discharge Plan:  [name]  In-House Referral:  Clinical Social Work  Discharge planning Services     Post Acute Care Choice:    Choice offered to:     DME Arranged:    DME Agency:     HH Arranged:    [facility] [place] Agency:     Status of Service:  In process, will continue to follow  If discussed at Long Length of Stay Meetings, dates discussed:    Additional Comments:  Marilu Favre, RN Oct 19, 2017, 10:56 AM
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
## 2017-10-27 DEATH — deceased
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
